# Patient Record
Sex: Male | Born: 1960 | Race: White | Hispanic: No | Smoking: Current every day smoker
Health system: Southern US, Community
[De-identification: ages and names within clinical notes are randomized; demographics above are authoritative.]

## PROBLEM LIST (undated history)

## (undated) DIAGNOSIS — J449 Chronic obstructive pulmonary disease, unspecified: Secondary | ICD-10-CM

## (undated) DIAGNOSIS — F419 Anxiety disorder, unspecified: Secondary | ICD-10-CM

## (undated) DIAGNOSIS — R7989 Other specified abnormal findings of blood chemistry: Secondary | ICD-10-CM

## (undated) DIAGNOSIS — T7840XA Allergy, unspecified, initial encounter: Secondary | ICD-10-CM

## (undated) DIAGNOSIS — K219 Gastro-esophageal reflux disease without esophagitis: Secondary | ICD-10-CM

## (undated) DIAGNOSIS — J45909 Unspecified asthma, uncomplicated: Secondary | ICD-10-CM

## (undated) DIAGNOSIS — R002 Palpitations: Secondary | ICD-10-CM

## (undated) DIAGNOSIS — R011 Cardiac murmur, unspecified: Secondary | ICD-10-CM

## (undated) DIAGNOSIS — H269 Unspecified cataract: Secondary | ICD-10-CM

## (undated) HISTORY — PX: TONSILLECTOMY: SUR1361

## (undated) HISTORY — DX: Allergy, unspecified, initial encounter: T78.40XA

## (undated) HISTORY — DX: Gastro-esophageal reflux disease without esophagitis: K21.9

## (undated) HISTORY — DX: Unspecified cataract: H26.9

## (undated) HISTORY — DX: Other specified abnormal findings of blood chemistry: R79.89

## (undated) HISTORY — DX: Unspecified asthma, uncomplicated: J45.909

## (undated) HISTORY — DX: Cardiac murmur, unspecified: R01.1

## (undated) HISTORY — DX: Anxiety disorder, unspecified: F41.9

## (undated) HISTORY — DX: Palpitations: R00.2

## (undated) HISTORY — PX: OTHER SURGICAL HISTORY: SHX169

## (undated) SURGERY — Surgical Case
Anesthesia: *Unknown

---
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
## 1992-10-10 HISTORY — PX: COLONOSCOPY: SHX174

## 1998-10-10 DIAGNOSIS — H269 Unspecified cataract: Secondary | ICD-10-CM

## 1998-10-10 HISTORY — DX: Unspecified cataract: H26.9

## 2006-11-24 ENCOUNTER — Emergency Department (HOSPITAL_COMMUNITY): Admission: EM | Admit: 2006-11-24 | Discharge: 2006-11-24 | Payer: Self-pay | Admitting: Emergency Medicine

## 2010-02-19 ENCOUNTER — Encounter: Admission: RE | Admit: 2010-02-19 | Discharge: 2010-02-19 | Payer: Self-pay | Admitting: Orthopedic Surgery

## 2018-10-10 DIAGNOSIS — J45909 Unspecified asthma, uncomplicated: Secondary | ICD-10-CM

## 2018-10-10 DIAGNOSIS — K219 Gastro-esophageal reflux disease without esophagitis: Secondary | ICD-10-CM

## 2018-10-10 HISTORY — DX: Unspecified asthma, uncomplicated: J45.909

## 2018-10-10 HISTORY — DX: Gastro-esophageal reflux disease without esophagitis: K21.9

## 2019-10-11 DIAGNOSIS — F419 Anxiety disorder, unspecified: Secondary | ICD-10-CM

## 2019-10-11 HISTORY — DX: Anxiety disorder, unspecified: F41.9

## 2019-11-08 ENCOUNTER — Encounter: Payer: Self-pay | Admitting: Emergency Medicine

## 2019-11-08 ENCOUNTER — Other Ambulatory Visit: Payer: Self-pay

## 2019-11-08 ENCOUNTER — Ambulatory Visit
Admission: EM | Admit: 2019-11-08 | Discharge: 2019-11-08 | Disposition: A | Discharge status: Home / Self Care | Payer: Managed Care, Other (non HMO)

## 2019-11-08 DIAGNOSIS — I498 Other specified cardiac arrhythmias: Secondary | ICD-10-CM | POA: Diagnosis not present

## 2019-11-08 HISTORY — DX: Chronic obstructive pulmonary disease, unspecified: J44.9

## 2019-11-08 NOTE — ED Provider Notes (Signed)
[facility]    CSN: [id number] Arrival date & time: 11/08/19  0854      History   Chief Complaint Chief Complaint  Patient presents with  . Dizziness    HPI Robert Yates is a 60 y.o. male.   Robert Yates 59 years old male with history of heart murmur presented to the urgent care with a complaint of heart fluttering on and off for the past 25-month.  Denies a precipitating event, trauma, or recent URI within the past month.  Report chest pain and dizziness at time.  Describes the dizziness as unsteady to walk. Has tried any medication.  Nothing made his symptoms worse.  Admits to previous symptoms which self resolved.  Denies fever, chills, nausea, vomiting, hearing changes, tinnitus, ear pain, chest pain, SOB, weakness, slurred speech, memory or emotional changes, facial drooping/ asymmetry, incoordination, numbness or tingling, abdominal pain, changes in bowel or bladder habits.    The history is provided by the patient. No language interpreter was used.  Dizziness Associated symptoms: chest pain     Past Medical History:  Diagnosis Date  . COPD (chronic obstructive pulmonary disease) (HCC)     There are no problems to display for this patient.   History reviewed. No pertinent surgical history.     Home Medications    Prior to Admission medications   Medication Sig Start Date End Date Taking? Authorizing Provider  Adair Patter 200-25 MCG/INH AEPB  10/02/19   [provider]    Family History Family History  Adopted: Yes    Social History Social History   Tobacco Use  . Smoking status: Current Some Day Smoker    Packs/day: 1.00    Types: Cigarettes  . Smokeless tobacco: Never Used  Substance Use Topics  . Alcohol use: Yes  . Drug use: Never     Allergies   Sulfa antibiotics   Review of Systems Review of Systems  Constitutional: Negative.   HENT: Negative.   Respiratory: Negative.   Cardiovascular: Positive for chest pain.   Neurological: Positive for dizziness.  All other systems reviewed and are negative.    Physical Exam Triage Vital Signs ED Triage Vitals  Enc Vitals Group     BP      Pulse      Resp      Temp      Temp src      SpO2      Weight      Height      Head Circumference      Peak Flow      Pain Score      Pain Loc      Pain Edu?      Excl. in [place]?    No data found.  Updated Vital Signs BP 127/83   Pulse 89   Temp 98.1 F (36.7 C)   Resp 18   SpO2 96%   Visual Acuity Right Eye Distance:   Left Eye Distance:   Bilateral Distance:    Right Eye Near:   Left Eye Near:    Bilateral Near:     Physical Exam Vitals and nursing note reviewed.  Constitutional:      General: He is not in acute distress.    Appearance: Normal appearance. He is normal weight. He is not toxic-appearing.  Cardiovascular:     Rate and Rhythm: Normal rate and regular rhythm.     Pulses: Normal pulses.     Heart sounds:  Murmur present.  Pulmonary:     Effort: Pulmonary effort is normal. No respiratory distress.     Breath sounds: Normal breath sounds. No wheezing, rhonchi or rales.  Chest:     Chest wall: No tenderness.  Neurological:     General: No focal deficit present.     Mental Status: He is alert and oriented to person, place, and time. Mental status is at baseline.     Cranial Nerves: Cranial nerves are intact. No cranial nerve deficit.     Sensory: Sensation is intact. No sensory deficit.     Motor: No weakness.     Coordination: Coordination is intact. Coordination normal.     Gait: Gait is intact. Gait normal.     Deep Tendon Reflexes: Reflexes normal.      UC Treatments / Results  Labs (all labs ordered are listed, but only abnormal results are displayed) Labs Reviewed - No data to display  EKG   Radiology No results found.  Procedures Procedures (including critical care time)  Medications Ordered in UC Medications - No data to display  Initial Impression /  Assessment and Plan / UC Course  I have reviewed the triage vital signs and the nursing notes.  Pertinent labs & imaging results that were available during my care of the patient were reviewed by me and considered in my medical decision making (see chart for details).   Currently patient is asymptomatic with no chest pain and dizziness and is stable for discharge . EKG was completed and showed normal sinus rhythm Follow-up with cardiologist as needed Primary care resources was given Advised patient to go to ER if he is having chest pain  Final Clinical Impressions(s) / UC Diagnoses   Final diagnoses:  Periodic heart flutter     Discharge Instructions     EKG showed normal sinus rhythm Primary care resource was given Referral to cardiologist is needed Advised patient to go to ER if he is having chest pain    ED Prescriptions    None     PDMP not reviewed this encounter.   Emerson Monte, [place] 11/08/19 [phone]

## 2019-11-08 NOTE — ED Triage Notes (Signed)
Pt presents with c/o heart fluttering for years.also chest tightness  Pt states that he he had a dizzy spell yesterday. Pt doesn't have PCP but decided he wanted to get checked out

## 2019-11-08 NOTE — Discharge Instructions (Addendum)
EKG showed normal sinus rhythm Primary care resource was given Referral to cardiologist is needed Advised patient to go to ER if he is having chest pain

## 2019-12-16 ENCOUNTER — Other Ambulatory Visit: Payer: Self-pay

## 2019-12-16 ENCOUNTER — Encounter (INDEPENDENT_AMBULATORY_CARE_PROVIDER_SITE_OTHER): Payer: Self-pay | Admitting: Internal Medicine

## 2019-12-16 ENCOUNTER — Ambulatory Visit (INDEPENDENT_AMBULATORY_CARE_PROVIDER_SITE_OTHER): Payer: 59 | Admitting: Internal Medicine

## 2019-12-16 VITALS — BP 122/75 | HR 90 | Temp 97.5°F | Ht 74.0 in | Wt 191.2 lb

## 2019-12-16 DIAGNOSIS — Z1211 Encounter for screening for malignant neoplasm of colon: Secondary | ICD-10-CM | POA: Diagnosis not present

## 2019-12-16 DIAGNOSIS — Z125 Encounter for screening for malignant neoplasm of prostate: Secondary | ICD-10-CM

## 2019-12-16 DIAGNOSIS — Z13220 Encounter for screening for lipoid disorders: Secondary | ICD-10-CM

## 2019-12-16 DIAGNOSIS — Z131 Encounter for screening for diabetes mellitus: Secondary | ICD-10-CM

## 2019-12-16 DIAGNOSIS — F17210 Nicotine dependence, cigarettes, uncomplicated: Secondary | ICD-10-CM

## 2019-12-16 DIAGNOSIS — Z23 Encounter for immunization: Secondary | ICD-10-CM

## 2019-12-16 DIAGNOSIS — E559 Vitamin D deficiency, unspecified: Secondary | ICD-10-CM

## 2019-12-16 DIAGNOSIS — J449 Chronic obstructive pulmonary disease, unspecified: Secondary | ICD-10-CM

## 2019-12-16 DIAGNOSIS — R5381 Other malaise: Secondary | ICD-10-CM

## 2019-12-16 DIAGNOSIS — R5383 Other fatigue: Secondary | ICD-10-CM

## 2019-12-16 HISTORY — DX: Chronic obstructive pulmonary disease, unspecified: J44.9

## 2019-12-16 NOTE — Progress Notes (Signed)
Metrics: Intervention Frequency ACO  Documented Smoking Status Yearly  Screened one or more times in 24 months  Cessation Counseling or  Active cessation medication Past 24 months  Past 24 months   Guideline developer: UpToDate (See UpToDate for funding source) Date Released: 2014       Wellness Office Visit  Subjective:  Patient ID: Robert Yates, male    DOB: [birth year]  Age: 59 y.o. MRN: [id number]  CC: This 59 year old man comes in as a new patient to be established. HPI He has underlying COPD which seems to be well controlled on medications and he does not require oxygen.  He continues to smoke cigarettes about a pack a day.  He has tried to quit in the past but has been unsuccessful. He has not seen a physician for many years now. He was seen at the urgent care in January of this year for palpitations it appears.  ECG shows sinus rhythm he does not appear to have the symptoms at the present time. He does describe intermittent fatigue.  Past Medical History:  Diagnosis Date  . COPD (chronic obstructive pulmonary disease) ([place])       Family History  Adopted: Yes  Problem Relation Age of Onset  . 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   Social History   Tobacco Use  . Smoking status: Current Some Day Smoker    Packs/day: 1.00    Types: Cigarettes  . Smokeless tobacco: Never Used  Substance Use Topics  . Alcohol use: Yes    Comment: occasional    Current Meds  Medication Sig  . BREO ELLIPTA 200-25 MCG/INH AEPB   . ibuprofen (ADVIL) 400 MG tablet Take 400 mg by mouth every evening.      Objective:   Today's Vitals: BP 122/75 (BP Location: Right Arm, Patient Position: Sitting, Cuff Size: Normal)   Pulse 90   Temp (!) 97.5 F (36.4 C) (Temporal)   Ht 6\' 2"  (1.88 m)   Wt 191 lb 3.2 oz (86.7 kg)   SpO2 96%    BMI 24.55 kg/m  Vitals with BMI 12/16/2019 11/08/2019  Height 6\' 2"  -  Weight 191 lbs 3 oz -  BMI 123XX123 -  Systolic 123XX123 [id number]  Diastolic 75 83  Pulse 90 89     Physical Exam   He looks systemically well.  He is in no acute respiratory distress.    Assessment   1. Need for tetanus booster   2. Chronic obstructive pulmonary disease, unspecified COPD type ([place])   3. Colon cancer screening   4. Malaise and fatigue   5. Screening for diabetes mellitus   6. Special screening for malignant neoplasm of prostate   7. Screening for lipoid disorders   8. Vitamin D deficiency disease   9. Cigarette nicotine dependence without complication       Tests ordered Orders Placed This Encounter  Procedures  . Tdap vaccine greater than or equal to 7yo IM  . CBC  . COMPLETE METABOLIC PANEL WITH GFR  . Hemoglobin A1c  . Lipid panel  . T3, free  . TSH  . VITAMIN D 25 Hydroxy (Vit-D Deficiency, Fractures)  . T4  . PSA  . Ambulatory referral to Gastroenterology     Plan: 1. Blood work is ordered above and we will send him for screening colonoscopy.  2. I spent at 10 minutes discussing smoking cessation. 3. He was given Tdap vaccination today. 4. Further recommendations will depend on blood results and I will see him in 1 month's time for an annual physical exam. 5. I spent 40 minutes with this patient today discussing his overall health.   No orders of the defined types were placed in this encounter.   Doree Albee, MD

## 2019-12-17 ENCOUNTER — Encounter: Payer: Self-pay | Admitting: Internal Medicine

## 2019-12-17 LAB — COMPLETE METABOLIC PANEL WITH GFR
AG Ratio: 1.9 (calc) (ref 1.0–2.5)
ALT: 17 U/L (ref 9–46)
AST: 16 U/L (ref 10–35)
Albumin: 4.7 g/dL (ref 3.6–5.1)
Alkaline phosphatase (APISO): 65 U/L (ref 35–144)
BUN: 14 mg/dL (ref 7–25)
CO2: 30 mmol/L (ref 20–32)
Calcium: 10.5 mg/dL — ABNORMAL HIGH (ref 8.6–10.3)
Chloride: 104 mmol/L (ref 98–110)
Creat: 0.82 mg/dL (ref 0.70–1.25)
GFR, Est African American: 111 mL/min/{1.73_m2} (ref >=60)
GFR, Est Non African American: 95 mL/min/{1.73_m2} (ref >=60)
Globulin: 2.5 g/dL (calc) (ref 1.9–3.7)
Glucose, Bld: 84 mg/dL (ref 65–139)
Potassium: 4.6 mmol/L (ref 3.5–5.3)
Sodium: 140 mmol/L (ref 135–146)
Total Bilirubin: 0.4 mg/dL (ref 0.2–1.2)
Total Protein: 7.2 g/dL (ref 6.1–8.1)

## 2019-12-17 LAB — LIPID PANEL
Cholesterol: 211 mg/dL — ABNORMAL HIGH (ref <200)
HDL: 46 mg/dL (ref >=40)
LDL Cholesterol (Calc): 135 mg/dL (calc) — ABNORMAL HIGH
Non-HDL Cholesterol (Calc): 165 mg/dL (calc) — ABNORMAL HIGH (ref <130)
Total CHOL/HDL Ratio: 4.6 (calc) (ref <5.0)
Triglycerides: 163 mg/dL — ABNORMAL HIGH (ref <150)

## 2019-12-17 LAB — CBC
HCT: 51.3 % — ABNORMAL HIGH (ref 38.5–50.0)
Hemoglobin: 17.2 g/dL — ABNORMAL HIGH (ref 13.2–17.1)
MCH: 29.6 pg (ref 27.0–33.0)
MCHC: 33.5 g/dL (ref 32.0–36.0)
MCV: 88.1 fL (ref 80.0–100.0)
MPV: 10.6 fL (ref 7.5–12.5)
Platelets: 283 10*3/uL (ref 140–400)
RBC: 5.82 10*6/uL — ABNORMAL HIGH (ref 4.20–5.80)
RDW: 13.1 % (ref 11.0–15.0)
WBC: 6.1 10*3/uL (ref 3.8–10.8)

## 2019-12-17 LAB — T3, FREE: T3, Free: 3.3 pg/mL (ref 2.3–4.2)

## 2019-12-17 LAB — HEMOGLOBIN A1C
Hgb A1c MFr Bld: 5.7 % of total Hgb — ABNORMAL HIGH (ref <5.7)
Mean Plasma Glucose: 117 (calc)
eAG (mmol/L): 6.5 (calc)

## 2019-12-17 LAB — PSA: PSA: 2.6 ng/mL (ref <=4.0)

## 2019-12-17 LAB — VITAMIN D 25 HYDROXY (VIT D DEFICIENCY, FRACTURES): Vit D, 25-Hydroxy: 20 ng/mL — ABNORMAL LOW (ref 30–100)

## 2019-12-17 LAB — TSH: TSH: 1.71 mIU/L (ref 0.40–4.50)

## 2019-12-17 LAB — T4: T4, Total: 10.9 ug/dL — ABNORMAL HIGH (ref 4.9–10.5)

## 2020-01-14 ENCOUNTER — Other Ambulatory Visit: Payer: Self-pay

## 2020-01-14 ENCOUNTER — Encounter (INDEPENDENT_AMBULATORY_CARE_PROVIDER_SITE_OTHER): Payer: Self-pay | Admitting: Internal Medicine

## 2020-01-14 ENCOUNTER — Ambulatory Visit (INDEPENDENT_AMBULATORY_CARE_PROVIDER_SITE_OTHER): Payer: 59 | Admitting: Internal Medicine

## 2020-01-14 VITALS — BP 120/70 | HR 92 | Temp 98.6°F | Ht 74.0 in | Wt 174.4 lb

## 2020-01-14 DIAGNOSIS — R7303 Prediabetes: Secondary | ICD-10-CM

## 2020-01-14 DIAGNOSIS — R002 Palpitations: Secondary | ICD-10-CM | POA: Diagnosis not present

## 2020-01-14 DIAGNOSIS — R0789 Other chest pain: Secondary | ICD-10-CM

## 2020-01-14 DIAGNOSIS — E559 Vitamin D deficiency, unspecified: Secondary | ICD-10-CM | POA: Diagnosis not present

## 2020-01-14 DIAGNOSIS — G4700 Insomnia, unspecified: Secondary | ICD-10-CM

## 2020-01-14 DIAGNOSIS — E782 Mixed hyperlipidemia: Secondary | ICD-10-CM

## 2020-01-14 DIAGNOSIS — R6882 Decreased libido: Secondary | ICD-10-CM

## 2020-01-14 NOTE — Patient Instructions (Addendum)
[facility] Dietary Recommendations for Weight Loss What to Avoid . Avoid added sugars o Often added sugar can be found in processed foods such as many condiments, dry cereals, cakes, cookies, chips, crisps, crackers, candies, sweetened drinks, etc.  o Read labels and AVOID/DECREASE use of foods with the following in their ingredient list: Sugar, fructose, high fructose corn syrup, sucrose, glucose, maltose, dextrose, molasses, cane sugar, brown sugar, any type of syrup, agave nectar, etc.   . Avoid snacking in between meals . Avoid foods made with flour o If you are going to eat food made with flour, choose those made with whole-grains; and, minimize your consumption as much as is tolerable . Avoid processed foods o These foods are generally stocked in the middle of the grocery store. Focus on shopping on the perimeter of the grocery.  . Avoid Meat  o We recommend following a plant-based diet at [facility]. Thus, we recommend avoiding meat as a general rule. Consider eating beans, legumes, eggs, and/or dairy products for regular protein sources o If you plan on eating meat limit to 4 ounces of meat at a time and choose lean options such as Fish, chicken, turkey. Avoid red meat intake such as pork and/or steak What to Include . Vegetables o GREEN LEAFY VEGETABLES: Kale, spinach, mustard greens, collard greens, cabbage, broccoli, etc. o OTHER: Asparagus, cauliflower, eggplant, carrots, peas, Brussel sprouts, tomatoes, bell peppers, zucchini, beets, cucumbers, etc. . Grains, seeds, and legumes o Beans: kidney beans, black eyed peas, garbanzo beans, black beans, pinto beans, etc. o Whole, unrefined grains: brown rice, barley, bulgur, oatmeal, etc. . Healthy fats  o Avoid highly processed fats such as vegetable oil o Examples of healthy fats: avocado, olives, virgin olive oil, dark chocolate (?72% Cocoa), nuts (peanuts, almonds, walnuts, cashews, pecans, etc.) . None to Low  Intake of Animal Sources of Protein o Meat sources: chicken, turkey, salmon, tuna. Limit to 4 ounces of meat at one time. o Consider limiting dairy sources, but when choosing dairy focus on: PLAIN Greek yogurt, cottage cheese, high-protein milk . Fruit o Choose berries  When to Eat . Intermittent Fasting: o Choosing not to eat for a specific time period, but DO FOCUS ON HYDRATION when fasting o Multiple Techniques: - Time Restricted Eating: eat 3 meals in a day, each meal lasting no more than 60 minutes, no snacks between meals - 16-18 hour fast: fast for 16 to 18 hours up to 7 days a week. Often suggested to start with 2-3 nonconsecutive days per week.  . Remember the time you sleep is counted as fasting.  . Examples of eating schedule: Fast from 7:00pm-11:00am. Eat between 11:00am-7:00pm.  - 24-hour fast: fast for 24 hours up to every other day. Often suggested to start with 1 day per week . Remember the time you sleep is counted as fasting . Examples of eating schedule:  o Eating day: eat 2-3 meals on your eating day. If doing 2 meals, each meal should last no more than 90 minutes. If doing 3 meals, each meal should last no more than 60 minutes. Finish last meal by 7:00pm. o Fasting day: Fast until 7:00pm.  o IF YOU FEEL UNWELL FOR ANY REASON/IN ANY WAY WHEN FASTING, STOP FASTING BY EATING A NUTRITIOUS SNACK OR LIGHT MEAL o ALWAYS FOCUS ON HYDRATION DURING FASTS - Acceptable Hydration sources: water, broths, tea/coffee (black tea/coffee is best but using a small amount of whole-fat dairy products in coffee/tea is acceptable).  -   Poor Hydration Sources: anything with sugar or artificial sweeteners added to it  These recommendations have been developed for patients that are actively receiving medical care from either Dr. Anastasio Champion or Jeralyn Ruths, DNP, NP-C at [facility]. These recommendations are developed for patients with specific medical conditions and are not meant to be  distributed or used by others that are not actively receiving care from either provider listed above at [facility]. It is not appropriate to participate in the above eating plans without proper medical supervision.   Reference: Rexanne Mano. The obesity code. [place]; 2016.  VITAMIN D3 10,000 UNITS/DAY   [email or web address]  Melatonin 1mg  capsules(#60)

## 2020-01-14 NOTE — Progress Notes (Signed)
PALMetrics: Intervention Frequency ACO  Documented Smoking Status Yearly  Screened one or more times in 24 months  Cessation Counseling or  Active cessation medication Past 24 months  Past 24 months   Guideline developer: UpToDate (See UpToDate for funding source) Date Released: 2014       Wellness Office Visit  Subjective:  Patient ID: Robert Yates, male    DOB: 09-14-1961  Age: 59 y.o. MRN: [id number]  CC: This man comes in for follow-up regarding his blood work from the last visit. HPI  He still complaining of left-sided chest pain associated with palpitations.  The pain is sharp in nature and he had gone to the urgent care for his palpitations.  Unfortunately, this still seem to be occurring. I discussed all his results with him today.  He is prediabetic and has vitamin D deficiency. He also has hyperlipidemia which does not require statin therapy. Unfortunately, he continues to smoke cigarettes. He also describes decreased libido which is been a problem for the last couple of years.  He is worried about his testosterone levels. Past Medical History:  Diagnosis Date  . COPD (chronic obstructive pulmonary disease) ([place])       Family History  Adopted: Yes  Problem Relation Age of Onset  . 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   Social History   Tobacco Use  . Smoking status: Current Some Day Smoker    Packs/day: 1.00    Types: Cigarettes  . Smokeless tobacco: Never Used  Substance Use Topics  . Alcohol use: Yes    Comment: occasional    Current Meds  Medication Sig  . BREO ELLIPTA 200-25 MCG/INH AEPB   . ibuprofen (ADVIL) 400 MG tablet Take 400 mg by mouth every evening.       Objective:   Today's Vitals: BP 120/70 (BP Location: Right Arm, Patient Position: Sitting, Cuff Size: Normal)   Pulse 92   Temp  98.6 F (37 C) (Oral)   Ht 6\' 2"  (1.88 m)   Wt 174 lb 6.4 oz (79.1 kg)   SpO2 95%   BMI 22.39 kg/m  Vitals with BMI 01/14/2020 12/16/2019 11/08/2019  Height 6\' 2"  6\' 2"  -  Weight 174 lbs 6 oz 191 lbs 3 oz -  BMI [id number] -  Systolic 123456 [id number] [id number]  Diastolic 70 75 83  Pulse 92 90 89     Physical Exam  He looks systemically well.  Blood pressure is well controlled.  No other new physical findings.  The weight of 191 pounds on the last visit is an error.     Assessment   1. Vitamin D deficiency disease   2. Mixed hyperlipidemia   3. Prediabetes   4. Palpitations   5. Other chest pain   6. Decreased libido   7. Insomnia, unspecified type       Tests ordered Orders Placed This Encounter  Procedures  . Testosterone Total,Free,Bio, Males  . Ambulatory referral to Cardiology     Plan: 1. I will refer him to cardiology as soon as possible for his symptoms of palpitations. 2. His risk for coronary artery disease in the next 10 years is 8.6% not taking into account coronary artery calcium score. 3. We discussed smoking cessation. 4. We discussed nutrition.  He realizes he needs to cut out sugary foods that he  tends to eat. 5. I recommended he start taking vitamin D3 10,000 units daily. 6. I will see him soon in the next 2 to 3 weeks time to discuss testosterone levels to see if we can help him with this. 7. Today I spent 30 minutes with this patient discussing all his results and answering his questions.   No orders of the defined types were placed in this encounter.   Doree Albee, MD

## 2020-01-15 LAB — TESTOSTERONE TOTAL,FREE,BIO, MALES
Albumin: 4.4 g/dL (ref 3.6–5.1)
Sex Hormone Binding: 59 nmol/L (ref 22–77)
Testosterone, Bioavailable: 83.4 ng/dL — ABNORMAL LOW (ref >=110.0)
Testosterone, Free: 41.4 pg/mL — ABNORMAL LOW (ref 46.0–224.0)
Testosterone: 511 ng/dL (ref 250–827)

## 2020-02-11 ENCOUNTER — Ambulatory Visit: Payer: 59 | Admitting: Cardiovascular Disease

## 2020-02-12 ENCOUNTER — Ambulatory Visit: Payer: 59

## 2020-02-19 ENCOUNTER — Ambulatory Visit (INDEPENDENT_AMBULATORY_CARE_PROVIDER_SITE_OTHER): Payer: 59 | Admitting: Internal Medicine

## 2020-04-17 ENCOUNTER — Ambulatory Visit: Payer: 59 | Admitting: Cardiovascular Disease

## 2020-05-05 ENCOUNTER — Other Ambulatory Visit: Payer: Self-pay

## 2020-05-05 ENCOUNTER — Ambulatory Visit (INDEPENDENT_AMBULATORY_CARE_PROVIDER_SITE_OTHER): Payer: Self-pay | Admitting: *Deleted

## 2020-05-05 DIAGNOSIS — Z1211 Encounter for screening for malignant neoplasm of colon: Secondary | ICD-10-CM

## 2020-05-05 NOTE — Progress Notes (Signed)
Pt requested a procedure after Labor Day and on a Friday.  Informed pt that I would call him back once Sept procedure schedules have been released.

## 2020-05-05 NOTE — Progress Notes (Signed)
Gastroenterology Pre-Procedure Review  Request Date: 05/05/2020 Requesting Physician: Dr. Carin Hock, Last TCS 09/10/1993 done by Dr. Fuller Plan, fragments of colonic mucosa with focal changes of hyperplastic colon polyps (x3)  PATIENT REVIEW QUESTIONS: The patient responded to the following health history questions as indicated:    1. Diabetes Melitis: no 2. Joint replacements in the past 12 months: no 3. Major health problems in the past 3 months: no 4. Has an artificial valve or MVP: no 5. Has a defibrillator: no 6. Has been advised in past to take antibiotics in advance of a procedure like teeth cleaning: no 7. Family history of colon cancer: yes, brother: age 4 8. Alcohol Use: yes, 6 beers a year 9. Illicit drug Use: no 10. History of sleep apnea: no 11. History of coronary artery or other vascular stents placed within the last 12 months: no 12. History of any prior anesthesia complications: no 13. There is no height or weight on file to calculate BMI. ht: 6'2 wt: 175 lbs    MEDICATIONS & ALLERGIES:    Patient reports the following regarding taking any blood thinners:   Plavix? no Aspirin? no Coumadin? no Brilinta? no Xarelto? no Eliquis? no Pradaxa? no Savaysa? no Effient? no  Patient confirms/reports the following medications:  Current Outpatient Medications  Medication Sig Dispense Refill  . BREO ELLIPTA 200-25 MCG/INH AEPB daily.     Marland Kitchen ibuprofen (ADVIL) 400 MG tablet Take 400 mg by mouth as needed.     . Ibuprofen-diphenhydrAMINE HCl (ADVIL PM) 200-25 MG CAPS Take by mouth at bedtime.    . Melatonin 10 MG CAPS Take by mouth as needed.     No current facility-administered medications for this visit.    Patient confirms/reports the following allergies:  Allergies  Allergen Reactions  . Sulfa Antibiotics Rash    No orders of the defined types were placed in this encounter.   AUTHORIZATION INFORMATION Primary Insurance: [place],  [place] #: [id number],  Group #:  [id number] Pre-Cert / Josem Kaufmann required: No, not required  SCHEDULE INFORMATION: Procedure has been scheduled as follows:  Date: , Time: Location: [facility] with Dr. Abbey Chatters  This Gastroenterology Pre-Precedure Review Form is being routed to the following provider(s): Aliene Altes, PA

## 2020-05-06 NOTE — Progress Notes (Signed)
Looks like patient has a pending cardiology visit in September. This is a new referral from PCP for further evaluation of chest pain and heart palpitations. I recommend we hold off on colonoscopy until cardiac evaluation is complete.

## 2020-05-07 NOTE — Progress Notes (Signed)
Called pt and informed him that we need to hold off on colonoscopy until cardiac evaluation is complete.  Informed pt to let his cardiologist know that he is supposed to have a future colonoscopy.  Advised him to call us back when and if his cardiologist thinks it is safe to have done.  Pt voiced understanding.

## 2020-06-04 ENCOUNTER — Encounter: Payer: Self-pay | Admitting: Cardiology

## 2020-06-04 DIAGNOSIS — Z7189 Other specified counseling: Secondary | ICD-10-CM | POA: Insufficient documentation

## 2020-06-04 DIAGNOSIS — R002 Palpitations: Secondary | ICD-10-CM | POA: Insufficient documentation

## 2020-06-04 NOTE — Progress Notes (Addendum)
Cardiology Office Note   Date:  06/14/2020   ID:  Robert Yates, DOB 21-Dec-1960, MRN [id number]  PCP:  Doree Albee, MD  Cardiologist:   Minus Breeding, MD Referring:  Doree Albee, MD  Chief Complaint  Patient presents with  . Palpitations      History of Present Illness: Robert Yates is a 59 y.o. male who was referred by Doree Albee, MD for evaluation of palpitations.  Patient has no past cardiac history.  He said he has been having palpitations.  He has noticed this when he is getting anxious and he thinks he is getting more anxious with age.  He feels his heart racing or skipping when he is anxious.  He will get short of breath.  It might last seconds to minutes.  He cannot bring it on.  He is not have any presyncope or syncope.  He has some rare fleeting chest discomfort.  He feels some numbness in his hand on the left side and some numbness on his left foot occasionally.  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  However, he is not having any visual disturbance.  He is sleeping poorly.  He does not describe any exertional chest pressure, neck or arm discomfort.  He does not describe any new shortness of breath, PND or orthopnea.  He has had no prior significant cardiac work-up other than when he was a child up until the age of 45 he reports having had evaluation including angiograms for an apparent congenital heart problem but he did not need to see an adult cardiologist by his report.  Past Medical History:  Diagnosis Date  . COPD (chronic obstructive pulmonary disease) ([place])   . Palpitations     History reviewed. No pertinent surgical history.   Current Outpatient Medications  Medication Sig Dispense Refill  . BREO ELLIPTA 200-25 MCG/INH AEPB daily.     Marland Kitchen ibuprofen (ADVIL) 400 MG tablet Take 400 mg by mouth as needed.     . Ibuprofen-diphenhydrAMINE HCl (ADVIL PM) 200-25 MG CAPS Take by mouth at bedtime.    . Melatonin 10 MG  CAPS Take by mouth as needed.    Marland Kitchen VITAMIN D PO Take by mouth 3 (three) times a week.    . propranolol (INDERAL) 10 MG tablet Take 1 tablet (10 mg total) by mouth 3 (three) times daily as needed. 90 tablet 3   No current facility-administered medications for this visit.    Allergies:   Sulfa antibiotics    Social History:  The patient  reports that he has been smoking cigarettes. He started smoking about 45 years ago. He has been smoking about 1.00 pack per day. He has never used smokeless tobacco. He reports current alcohol use. He reports current drug use.   Family History:  The patient's family history includes Cancer in his brother. He was adopted.    ROS:  Please see the history of present illness.   Otherwise, review of systems are positive for none.   All other systems are reviewed and negative.    PHYSICAL EXAM: VS:  BP 120/80   Pulse 80   Ht 6\' 2"  (1.88 m)   Wt 173 lb 12.8 oz (78.8 kg)   SpO2 97%   BMI 22.31 kg/m  , BMI Body mass index is 22.31 kg/m. GENERAL:  Well appearing HEENT:  Pupils equal round and reactive, fundi not visualized, oral mucosa unremarkable NECK:  No jugular venous distention, waveform within normal limits, carotid upstroke brisk and symmetric, no bruits, no thyromegaly LYMPHATICS:  No cervical, inguinal adenopathy LUNGS:  Clear to auscultation bilaterally BACK:  No CVA tenderness CHEST:  Unremarkable HEART:  PMI not displaced or sustained,S1 and S2 within normal limits, no S3, no S4, no clicks, no rubs, no murmurs ABD:  Flat, positive bowel sounds normal in frequency in pitch, no bruits, no rebound, no guarding, no midline pulsatile mass, no hepatomegaly, no splenomegaly EXT:  2 plus pulses throughout, no edema, no cyanosis no clubbing SKIN:  No rashes no nodules NEURO:  Cranial nerves II through XII grossly intact, motor grossly intact throughout PSYCH:  Cognitively intact, oriented to person place and time    EKG:  EKG is ordered today. The  ekg ordered today demonstrates sinus rhythm, rate 84, axis within normal limits, intervals within normal limits, premature ventricular contractions.   Recent Labs: 12/16/2019: ALT 17; BUN 14; Creat 0.82; Hemoglobin 17.2; Platelets 283; Potassium 4.6; Sodium 140; TSH 1.71    Lipid Panel    Component Value Date/Time   CHOL 211 (H) 12/16/2019 0919   TRIG 163 (H) 12/16/2019 0919   HDL 46 12/16/2019 0919   CHOLHDL 4.6 12/16/2019 0919   LDLCALC 135 (H) 12/16/2019 0919      Wt Readings from Last 3 Encounters:  06/05/20 173 lb 12.8 oz (78.8 kg)  01/14/20 174 lb 6.4 oz (79.1 kg)  12/16/19 191 lb 3.2 oz (86.7 kg)      Other studies Reviewed: Additional studies/ records that were reviewed today include: None. Review of the above records demonstrates:     ASSESSMENT AND PLAN:  PALPITATIONS: He does have PVCs on his EKG.  I suspect that is what he is feeling.  I am going to check an echocardiogram to make sure he has a structurally normal heart.  I would like to give him propranolol 10 mg as needed to take.  I will see him back in a few months to further evaluate at which point he might need to do other testing like a monitor.  CONGENITAL HEART DISEASE: This will be evaluated as above with the echo.  INSOMNIA: He has decreased sleep and snoring.  This is causing some problems during the day with work.  I will refer him for a sleep specialist evaluation.  PREOP: The patient is due to have a colonoscopy and is at acceptable risk for this.  No further testing is needed prior to this.  COVID EDUCATION: He has been vaccinated.   Current medicines are reviewed at length with the patient today.  The patient does not have concerns regarding medicines.  The following changes have been made: As above  Labs/ tests ordered today include:   Orders Placed This Encounter  Procedures  . Ambulatory referral to Neurology  . EKG 12-Lead  . ECHOCARDIOGRAM COMPLETE     Disposition:   FU with me  in about 3 months.      Signed, Minus Breeding, MD  06/14/2020 2:34 PM    [place]

## 2020-06-05 ENCOUNTER — Other Ambulatory Visit: Payer: Self-pay

## 2020-06-05 ENCOUNTER — Ambulatory Visit: Payer: 59 | Admitting: Cardiology

## 2020-06-05 ENCOUNTER — Encounter: Payer: Self-pay | Admitting: Cardiology

## 2020-06-05 VITALS — BP 120/80 | HR 80 | Ht 74.0 in | Wt 173.8 lb

## 2020-06-05 DIAGNOSIS — Z7189 Other specified counseling: Secondary | ICD-10-CM

## 2020-06-05 DIAGNOSIS — G4730 Sleep apnea, unspecified: Secondary | ICD-10-CM | POA: Diagnosis not present

## 2020-06-05 DIAGNOSIS — R002 Palpitations: Secondary | ICD-10-CM | POA: Diagnosis not present

## 2020-06-05 MED ORDER — PROPRANOLOL HCL 10 MG PO TABS: 10.0000 mg | ORAL_TABLET | Freq: Three times a day (TID) | ORAL | 3 refills | Status: DC | PRN

## 2020-06-05 MED ORDER — PROPRANOLOL HCL 10 MG PO TABS: 10.0000 mg | ORAL_TABLET | Freq: Three times a day (TID) | ORAL | 3 refills | Status: DC

## 2020-06-05 NOTE — Addendum Note (Signed)
Addended by: Julian Hy T on: 06/05/2020 01:33 PM   Modules accepted: Orders

## 2020-06-05 NOTE — Patient Instructions (Addendum)
Your physician recommends that you schedule a follow-up appointment in: [place] physician has recommended you make the following change in your medication:   START PROPRANOLOL 10 MG THREE TIMES DAILY AS NEEDED   You have been referred to [facility] has requested that you have an echocardiogram. Echocardiography is a painless test that uses sound waves to create images of your heart. It provides your doctor with information about the size and shape of your heart and how well your heart's chambers and valves are working. This procedure takes approximately one hour. There are no restrictions for this procedure.  Thank you for choosing [facility]!!

## 2020-06-12 ENCOUNTER — Other Ambulatory Visit: Payer: Self-pay

## 2020-06-12 ENCOUNTER — Ambulatory Visit (HOSPITAL_COMMUNITY)
Admission: RE | Admit: 2020-06-12 | Discharge: 2020-06-12 | Disposition: A | Discharge status: Home / Self Care | Payer: 59 | Source: Ambulatory Visit | Attending: Cardiology | Admitting: Cardiology

## 2020-06-12 DIAGNOSIS — R002 Palpitations: Secondary | ICD-10-CM | POA: Diagnosis present

## 2020-06-12 LAB — ECHOCARDIOGRAM COMPLETE
AR max vel: 3.04 cm2
AV Area VTI: 3.09 cm2
AV Area mean vel: 3.17 cm2
AV Mean grad: 2.1 mmHg
AV Peak grad: 4.1 mmHg
Ao pk vel: 1.02 m/s
Area-P 1/2: 3.91 cm2
S' Lateral: 2.85 cm

## 2020-06-12 NOTE — Progress Notes (Signed)
*  PRELIMINARY RESULTS* Echocardiogram 2D Echocardiogram has been performed.  Leavy Cella 06/12/2020, 9:11 AM

## 2020-06-15 ENCOUNTER — Encounter (INDEPENDENT_AMBULATORY_CARE_PROVIDER_SITE_OTHER): Payer: Self-pay | Admitting: Internal Medicine

## 2020-06-16 ENCOUNTER — Other Ambulatory Visit (INDEPENDENT_AMBULATORY_CARE_PROVIDER_SITE_OTHER): Payer: Self-pay | Admitting: Internal Medicine

## 2020-06-16 DIAGNOSIS — Z1211 Encounter for screening for malignant neoplasm of colon: Secondary | ICD-10-CM

## 2020-06-24 ENCOUNTER — Telehealth: Payer: Self-pay | Admitting: *Deleted

## 2020-06-24 NOTE — Telephone Encounter (Signed)
Pt said he was returning a call from late yesterday afternoon. [phone]

## 2020-06-24 NOTE — Progress Notes (Signed)
Spoke with pt and he said that everything checked out good with his heart. He is ready to schedule colonoscopy.  Aliene Altes, PA: Is it ok to proceed with scheduling?

## 2020-06-24 NOTE — Telephone Encounter (Signed)
Spoke to pt and he is ready to proceed with colonoscopy.  Sent message to Aliene Altes, PA to make sure it is ok to proceed since his visit with cardiology.

## 2020-06-25 NOTE — Progress Notes (Signed)
Noted. Reviewed recent cardiology visit. Stated, "The patient is due to have a colonoscopy and is at acceptable risk for this.  No further testing is needed prior to this."   Lets get him scheduled for an OV to arrange procedure. History of COPD meets ASA III criteria.

## 2020-06-26 ENCOUNTER — Ambulatory Visit: Payer: 59 | Admitting: Cardiology

## 2020-07-02 ENCOUNTER — Telehealth: Payer: Self-pay | Admitting: *Deleted

## 2020-07-02 NOTE — Telephone Encounter (Signed)
Tried to call pt.  VM has not been set up.  Pt needs appt to schedule procedure.

## 2020-07-03 NOTE — Telephone Encounter (Signed)
Tried to call pt again.  VM not set up.

## 2020-07-06 ENCOUNTER — Encounter: Payer: Self-pay | Admitting: *Deleted

## 2020-07-06 NOTE — Telephone Encounter (Signed)
VM not set up.  Mailing letter to pt to contact our office.  Pt needs ov to arrange colonoscopy.

## 2020-07-06 NOTE — Telephone Encounter (Signed)
Tried to call pt. Vm not set up.  

## 2020-07-10 ENCOUNTER — Telehealth: Payer: Self-pay | Admitting: *Deleted

## 2020-07-10 NOTE — Telephone Encounter (Signed)
Tried to call pt back but vm not set up. 

## 2020-07-10 NOTE — Telephone Encounter (Signed)
PATIENT RETURNED CALL, PLEASE CALL BACK WHEN YOU CAN  

## 2020-08-05 NOTE — Telephone Encounter (Signed)
VM not set up.

## 2020-08-05 NOTE — Progress Notes (Signed)
Tried to reach pt multiple times with no success.  Letter was mailed on 07/06/2020.

## 2020-09-18 ENCOUNTER — Ambulatory Visit: Payer: 59 | Admitting: Cardiology

## 2020-10-22 ENCOUNTER — Encounter (INDEPENDENT_AMBULATORY_CARE_PROVIDER_SITE_OTHER): Payer: Self-pay | Admitting: Internal Medicine

## 2020-10-22 ENCOUNTER — Telehealth (INDEPENDENT_AMBULATORY_CARE_PROVIDER_SITE_OTHER): Payer: 59 | Admitting: Internal Medicine

## 2020-10-22 VITALS — Temp 99.0°F | Ht 74.0 in

## 2020-10-22 DIAGNOSIS — U071 COVID-19: Secondary | ICD-10-CM

## 2020-10-22 NOTE — Progress Notes (Signed)
Metrics: Intervention Frequency ACO  Documented Smoking Status Yearly  Screened one or more times in 24 months  Cessation Counseling or  Active cessation medication Past 24 months  Past 24 months   Guideline developer: UpToDate (See UpToDate for funding source) Date Released: 2014       Wellness Office Visit  Subjective:  Patient ID: Robert Yates, male    DOB: 07/30/1961  Age: 60 y.o. MRN: [id number]  CC: This is an audio telemedicine visit with the permission of the patient who is at home and I am in my office.  I used to identifies to identify the patient. COVID-19 disease. HPI  The patient started having symptoms of chest congestion, sore throat, body aches 3 days ago.  He had previous exposure to someone that was positive for COVID.  He eventually did get a COVID-19 test this morning and it was positive. He has had only 1 vaccine of [employer] back in April 2021.  He has not had any further vaccine doses. He has underlying COPD.  He says that he is not short of breath at the present time. Past Medical History:  Diagnosis Date  . COPD (chronic obstructive pulmonary disease) ([place])   . Palpitations    History reviewed. No pertinent surgical history.   Family History  Adopted: Yes  Problem Relation Age of Onset  . 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   Social History   Tobacco Use  . Smoking status: Current Some Day Smoker    Packs/day: 1.00    Types: Cigarettes    Start date: 11/07/1974  . Smokeless tobacco: Never Used  Substance Use Topics  . Alcohol use: Yes    Comment: occasional    Current Meds  Medication Sig  . BREO ELLIPTA 200-25 MCG/INH AEPB daily.   Marland Kitchen ibuprofen (ADVIL) 400 MG tablet Take 400 mg by mouth as needed.   . Ibuprofen-diphenhydrAMINE HCl (ADVIL PM) 200-25 MG CAPS Take by mouth at  bedtime.  . Melatonin 10 MG CAPS Take by mouth as needed.  Marland Kitchen VITAMIN D PO Take by mouth 3 (three) times a week.      Depression screen [facility] 2/9 01/14/2020 12/16/2019  Decreased Interest 0 0  Down, Depressed, Hopeless 0 0  PHQ - 2 Score 0 0     Objective:   Today's Vitals: Temp 99 F (37.2 C) (Temporal)   Ht 6\' 2"  (1.88 m)   BMI 22.31 kg/m  Vitals with BMI 10/22/2020 06/05/2020 01/14/2020  Height 6\' 2"  6\' 2"  6\' 2"   Weight (No Data) 173 lbs 13 oz 174 lbs 6 oz  BMI - 56.21 30.86  Systolic (No Data) 578 469  Diastolic (No Data) 80 70  Pulse (No Data) 80 92     Physical Exam  Virtual visit.  He appears to be alert and orientated on the phone.     Assessment   1. COVID-19       Tests ordered Orders Placed This Encounter  Procedures  . Ambulatory Referral for Covid Treatment     Plan: 1. Since he is at high risk and he has only had 1 dose of COVID-19 vaccine which was [employer], there is a possibility that he may get worse and need hospitalization.  I have referred him to ambulatory therapy for COVID treatment and hopefully he  will get a phone call although supplies are short.  In the meantime, he will keep well-hydrated, take vitamin D3 and vitamin C.  If he gets worse especially with his breathing, I recommended he go to the emergency room to be evaluated further. 2. This phone call lasted 5 minutes and 22 seconds.   No orders of the defined types were placed in this encounter.   Doree Albee, MD

## 2020-12-01 ENCOUNTER — Ambulatory Visit (INDEPENDENT_AMBULATORY_CARE_PROVIDER_SITE_OTHER): Payer: 59 | Admitting: Internal Medicine

## 2020-12-22 ENCOUNTER — Ambulatory Visit (INDEPENDENT_AMBULATORY_CARE_PROVIDER_SITE_OTHER): Payer: 59 | Admitting: Internal Medicine

## 2020-12-22 ENCOUNTER — Other Ambulatory Visit: Payer: Self-pay

## 2020-12-22 ENCOUNTER — Encounter (INDEPENDENT_AMBULATORY_CARE_PROVIDER_SITE_OTHER): Payer: Self-pay | Admitting: Internal Medicine

## 2020-12-22 VITALS — BP 108/72 | HR 92 | Temp 97.3°F | Ht 74.0 in | Wt 171.8 lb

## 2020-12-22 DIAGNOSIS — G4700 Insomnia, unspecified: Secondary | ICD-10-CM

## 2020-12-22 DIAGNOSIS — E291 Testicular hypofunction: Secondary | ICD-10-CM

## 2020-12-22 NOTE — Progress Notes (Signed)
Metrics: Intervention Frequency ACO  Documented Smoking Status Yearly  Screened one or more times in 24 months  Cessation Counseling or  Active cessation medication Past 24 months  Past 24 months   Guideline developer: UpToDate (See UpToDate for funding source) Date Released: 2014       Wellness Office Visit  Subjective:  Patient ID: Robert Yates, male    DOB: 02-18-1961  Age: 60 y.o. MRN: [id number]  CC: This man comes in to discuss testosterone levels. HPI  In April 2021 we check testosterone levels and total testosterone levels were normal but free testosterone levels were low.  He still has issues with decreased libido. Past Medical History:  Diagnosis Date  . COPD (chronic obstructive pulmonary disease) ([place])   . Palpitations    History reviewed. No pertinent surgical history.   Family History  Adopted: Yes  Problem Relation Age of Onset  . 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   Social History   Tobacco Use  . Smoking status: Current Some Day Smoker    Packs/day: 1.00    Types: Cigarettes    Start date: 11/07/1974  . Smokeless tobacco: Never Used  Substance Use Topics  . Alcohol use: Yes    Comment: occasional    Current Meds  Medication Sig  . BREO ELLIPTA 200-25 MCG/INH AEPB daily.   Marland Kitchen ibuprofen (ADVIL) 400 MG tablet Take 400 mg by mouth as needed.   . Ibuprofen-diphenhydrAMINE HCl (ADVIL PM) 200-25 MG CAPS Take by mouth at bedtime.  Marland Kitchen VITAMIN D PO Take by mouth 3 (three) times a week.     [place] Office Visit from 12/22/2020 in [facility]  PHQ-9 Total Score 0      Objective:   Today's Vitals: BP 108/72   Pulse 92   Temp (!) 97.3 F (36.3 C) (Temporal)   Ht 6\' 2"  (1.88 m)   Wt 171 lb 12.8 oz (77.9 kg)   SpO2 96%   BMI 22.06 kg/m  Vitals with BMI 12/22/2020 10/22/2020  06/05/2020  Height 6\' 2"  6\' 2"  6\' 2"   Weight 171 lbs 13 oz (No Data) 173 lbs 13 oz  BMI 93.79 - 02.40  Systolic 973 (No Data) 532  Diastolic 72 (No Data) 80  Pulse 92 (No Data) 80     Physical Exam  He looks systemically well.  No new physical findings.    Assessment   1. Insomnia, unspecified type   2. Testicular failure       Tests ordered No orders of the defined types were placed in this encounter.    Plan: 1. He continues to have difficulties with insomnia and he has tried melatonin for many places. 2. We discussed testosterone therapy, FDA warnings, benefits and side effects and he will think about it some more and discuss it with his partner. 3. In the meantime I will schedule an annual physical with Judson Roch in 6 months time.   No orders of the defined types were placed in this encounter.   Doree Albee, MD

## 2021-04-19 ENCOUNTER — Telehealth (INDEPENDENT_AMBULATORY_CARE_PROVIDER_SITE_OTHER): Payer: Self-pay

## 2021-04-19 DIAGNOSIS — J449 Chronic obstructive pulmonary disease, unspecified: Secondary | ICD-10-CM

## 2021-04-19 DIAGNOSIS — U071 COVID-19: Secondary | ICD-10-CM

## 2021-04-19 DIAGNOSIS — Z7189 Other specified counseling: Secondary | ICD-10-CM

## 2021-04-19 NOTE — Telephone Encounter (Signed)
Referral approved

## 2021-05-04 ENCOUNTER — Ambulatory Visit
Admission: EM | Admit: 2021-05-04 | Discharge: 2021-05-04 | Disposition: A | Discharge status: Home / Self Care | Payer: 59 | Attending: Family Medicine | Admitting: Family Medicine

## 2021-05-04 ENCOUNTER — Ambulatory Visit: Payer: 59

## 2021-05-04 DIAGNOSIS — L255 Unspecified contact dermatitis due to plants, except food: Secondary | ICD-10-CM

## 2021-05-04 MED ORDER — PREDNISONE 10 MG (48) PO TBPK: ORAL_TABLET | ORAL | 0 refills | Status: DC

## 2021-05-04 NOTE — ED Provider Notes (Signed)
  [facility]   [id number] 05/04/21 Arrival Time: [id number]  ASSESSMENT & PLAN:  1. Rhus dermatitis    No signs of skin infection. Begin: Meds ordered this encounter  Medications   predniSONE (STERAPRED UNI-PAK 48 TAB) 10 MG (48) TBPK tablet    Sig: Take as directed.    Dispense:  48 tablet    Refill:  0   Benadryl as needed.   Follow-up Information     Ailene Ards, NP.   Specialty: Nurse Practitioner Why: If worsening or failing to improve as anticipated. Contact information: [place] [phone]                 Reviewed expectations re: course of current medical issues. Questions answered. Outlined signs and symptoms indicating need for more acute intervention. Patient verbalized understanding. After Visit Summary given.   SUBJECTIVE:  ALLEC CLAYDON is a 60 y.o. male who presents with a skin complaint. Itchy rash; generalized. Noted this week after cleaning brush from under tree. Afebrile. OTC tx without relief.  OBJECTIVE: Vitals:   05/04/21 0952  BP: 127/86  Pulse: 97  Resp: 18  Temp: 97.9 F (36.6 C)  SpO2: 95%    General appearance: alert; no distress HEENT: [place]; AT Neck: supple with FROM Lungs: unlabored Extremities: no edema; moves all extremities normally Skin: warm and dry; signs of infection: yes; areas of linear papules and vesicles with surrounding erythema over trunk and extremities Psychological: alert and cooperative; normal mood and affect  Allergies  Allergen Reactions   Sulfa Antibiotics Rash    Past Medical History:  Diagnosis Date   COPD (chronic obstructive pulmonary disease) ([facility])    Palpitations    Social History   Socioeconomic History   Marital status: Divorced    Spouse name: [name]   Number of children: [name]   Years of education: [name]   Highest education level: [name]  Occupational History   Occupation: farmer  Tobacco Use   Smoking status:  Some Days    Packs/day: 1.00    Types: Cigarettes    Start date: 11/07/1974   Smokeless tobacco: Never  Vaping Use   Vaping Use: Never used  Substance and Sexual Activity   Alcohol use: Yes    Comment: occasional   Drug use: Yes    Comment: Nothing in 30 years   Sexual activity: [name]  Other Topics Concern   [name]  Social History Narrative   Divorced for 20 years,married for 13 years previously.Lives with girlfriend of 2 years.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   Social Determinants of Health   Financial Resource Strain: [name]  Food Insecurity: [name]  Transportation Needs: [name]  Physical Activity: [name]  Stress: [name]  Social Connections: [name]  Intimate Partner Violence: [name]   Family History  Adopted: Yes  Problem Relation Age of Onset   Cancer Brother    History reviewed. No pertinent surgical history.    Vanessa Kick, MD 05/04/21 1010

## 2021-05-04 NOTE — ED Triage Notes (Signed)
Pt presents with possible poison ivy rash on legs and arms for past week

## 2021-06-10 ENCOUNTER — Encounter: Payer: Self-pay | Admitting: Internal Medicine

## 2021-06-10 ENCOUNTER — Other Ambulatory Visit: Payer: Self-pay

## 2021-06-10 ENCOUNTER — Ambulatory Visit (INDEPENDENT_AMBULATORY_CARE_PROVIDER_SITE_OTHER): Payer: 59 | Admitting: Internal Medicine

## 2021-06-10 VITALS — BP 128/62 | HR 88 | Temp 97.7°F | Ht 74.0 in | Wt 174.0 lb

## 2021-06-10 DIAGNOSIS — J449 Chronic obstructive pulmonary disease, unspecified: Secondary | ICD-10-CM | POA: Diagnosis not present

## 2021-06-10 DIAGNOSIS — F17210 Nicotine dependence, cigarettes, uncomplicated: Secondary | ICD-10-CM | POA: Diagnosis not present

## 2021-06-10 MED ORDER — FLUTICASONE FUROATE-VILANTEROL 100-25 MCG/INH IN AEPB: 1.0000 | INHALATION_SPRAY | Freq: Every morning | RESPIRATORY_TRACT | 11 refills | Status: DC

## 2021-06-10 MED ORDER — FLUTICASONE FUROATE-VILANTEROL 100-25 MCG/INH IN AEPB: 1.0000 | INHALATION_SPRAY | Freq: Every day | RESPIRATORY_TRACT | 0 refills | Status: DC

## 2021-06-10 NOTE — Patient Instructions (Addendum)
We need your last set of lung functions from Dr Tamala Julian at [facility]   I will be referring you to Eric Form NP who be  willing calling you to set up a low dose CT chest   Plan A = Automatic = Always=    Breo 100 one each   Stay as physically active as you can ideally about 30 minutes of exercise a day    If you are satisfied with your treatment plan,  let your doctor know and he/she can either refill your medications or you can return here when your prescription runs out.     If in any way you are not 100% satisfied,  please tell us.  If 100% better, tell your friends!  Pulmonary follow up is as needed

## 2021-06-10 NOTE — Assessment & Plan Note (Signed)
Active smoker  -  Onset around 2019 -  Breo 100 one each am  06/10/2021   .relatively mild copd/ AB with noct cough no better with pred ? gerd or non-allergic rhinitis with pnds vs side effect of high dose breo so try breo 100 one each am blow out thru nose and f/u p 6 weeks if not better  If better to his satisfaction f/u can be per pcp

## 2021-06-10 NOTE — Assessment & Plan Note (Signed)
>   3 min discussion I reviewed the Fletcher curve with the patient that basically indicates  if you quit smoking when your best day FEV1 is still well preserved (as is clearly  the case here)  it is highly unlikely you will progress to severe disease and informed the patient there was  no medication on the market that has proven to alter the curve/ its downward trajectory  or the likelihood of progression of their disease(unlike other chronic medical conditions such as atheroclerosis where we do think we can change the natural hx with risk reducing meds)    Therefore stopping smoking and maintaining abstinence are  the most important aspects of care, not choice of inhalers or for that matter, doctors.   Treatment other than smoking cessation  is entirely directed by severity of symptoms and focused also on reducing exacerbations, not attempting to change the natural history of the disease.    Since having min symptoms s aecopd if he does well on breo 100 fu here can be prn   Low-dose CT lung cancer screening is recommended for patients who are 48-102 years of age with a 30+ pack-year history of smoking, and who are currently smoking or quit <=15 years ago.   Discussed in detail all the  indications, usual  risks and alternatives  relative to the benefits with patient who agrees to proceed with w/u as outlined.     >>> referred for shared decision making          Each maintenance medication was reviewed in detail including emphasizing most importantly the difference between maintenance and prns and under what circumstances the prns are to be triggered using an action plan format where appropriate.  Total time for H and P, chart review, counseling, reviewing dpi/elipta device(s) and generating customized AVS unique to this office visit / same day charting = 29mn

## 2021-06-10 NOTE — Progress Notes (Signed)
Robert Yates, male    DOB: Oct 09, 1961,    MRN: [id number]   Brief patient profile:  60 yo wm   active smoker ran [employer]  referred to pulmonary clinic in [place]  06/10/2021 for copd eval dx in [place] "Stage 2"     History of Present Illness  06/10/2021  Pulmonary/ 1st office eval/ Robert Yates / [place] [facility]  Chief Complaint  Patient presents with   Follow-up    Some SOB when walking with exertion but not when sitting still normally. Coughing a lot while laying down, sometimes coughs up mucus with yellow and clear. Switched to Dr. Melvyn Novas for location purposes and medication. No oxygen or CPAP at home     Dyspnea:  MMRC1 = can walk nl pace, flat grade, can't hurry or go uphills or steps s sob  on Breo 200  Cough: minimal at hs most noct  Sleep: bed is flat, one pillow does better when it's cool SABA use: none  Lung cancer screening :  referred for early decision making  No obvious day to day or daytime variability or assoc excess/ purulent sputum or mucus plugs or hemoptysis or cp or chest tightness, subjective wheeze or overt sinus or hb symptoms.     Also denies any obvious fluctuation of symptoms with weather or environmental changes or other aggravating or alleviating factors except as outlined above   No unusual exposure hx or h/o childhood pna/ asthma or knowledge of premature birth.  Current Allergies, Complete Past Medical History, Past Surgical History, Family History, and Social History were reviewed in [employer] record.  ROS  The following are not active complaints unless bolded Hoarseness, sore throat, dysphagia, dental problems, itching, sneezing,  nasal congestion or discharge of excess mucus or purulent secretions, ear ache,   fever, chills, sweats, unintended wt loss or wt gain, classically pleuritic or exertional cp,  orthopnea pnd or arm/hand swelling  or leg swelling, presyncope, palpitations, abdominal pain, anorexia, nausea, vomiting,  diarrhea  or change in bowel habits or change in bladder habits, change in stools or change in urine, dysuria, hematuria,  rash, arthralgias, visual complaints, headache, numbness, weakness or ataxia or problems with walking or coordination,  change in mood or  memory.           Past Medical History:  Diagnosis Date   COPD (chronic obstructive pulmonary disease) ([place])    Palpitations     Outpatient Medications Prior to Visit  Medication Sig Dispense Refill   BREO ELLIPTA 200-25 MCG/INH AEPB daily.      ibuprofen (ADVIL) 400 MG tablet Take 400 mg by mouth as needed.      Ibuprofen-diphenhydrAMINE HCl (ADVIL PM) 200-25 MG CAPS Take by mouth at bedtime.     Melatonin 10 MG CAPS Take by mouth as needed.        48 tablet 0   VITAMIN D PO Take by mouth 3 (three) times a week.     No facility-administered medications prior to visit.     Objective:     BP 128/62 (BP Location: Left Arm, Patient Position: Sitting, Cuff Size: Normal)   Pulse 88   Temp 97.7 F (36.5 C) (Oral)   Ht '6\' 2"'$  (1.88 m)   Wt 174 lb (78.9 kg)   SpO2 98% Comment: RA  BMI 22.34 kg/m   SpO2: 98 % (RA)  Thin pleasant wm nad   HEENT : pt wearing mask not removed for exam due to covid -  19 concerns.   NECK :  without JVD/Nodes/TM/ nl carotid upstrokes bilaterally   LUNGS: no acc muscle use,  Min barrel  contour chest wall with bilateral  slightly decreased bs s audible wheeze and  without cough on insp or exp maneuvers and min  Hyperresonant  to  percussion bilaterally     CV:  RRR  no s3 or murmur or increase in P2, and no edema   ABD:  soft and nontender with pos end  insp Hoover's  in the supine position. No bruits or organomegaly appreciated, bowel sounds nl  MS:   Nl gait/  ext warm without deformities, calf tenderness, cyanosis or clubbing No obvious joint restrictions   SKIN: warm and dry without lesions    NEURO:  alert, approp, nl sensorium with  no motor or cerebellar deficits apparent.              Assessment   COPD  GOLD ? / active smoking  Active smoker  -  Onset around 2019 -  Breo 100 one each am  06/10/2021   .relatively mild copd/ AB with noct cough no better with pred ? gerd or non-allergic rhinitis with pnds vs side effect of high dose breo so try breo 100 one each am blow out thru nose and f/u p 6 weeks if not better  If better to his satisfaction f/u can be per pcp    Cigarette smoker > 3 min discussion I reviewed the Fletcher curve with the patient that basically indicates  if you quit smoking when your best day FEV1 is still well preserved (as is clearly  the case here)  it is highly unlikely you will progress to severe disease and informed the patient there was  no medication on the market that has proven to alter the curve/ its downward trajectory  or the likelihood of progression of their disease(unlike other chronic medical conditions such as atheroclerosis where we do think we can change the natural hx with risk reducing meds)    Therefore stopping smoking and maintaining abstinence are  the most important aspects of care, not choice of inhalers or for that matter, doctors.   Treatment other than smoking cessation  is entirely directed by severity of symptoms and focused also on reducing exacerbations, not attempting to change the natural history of the disease.    Since having min symptoms s aecopd if he does well on breo 100 fu here can be prn   Low-dose CT lung cancer screening is recommended for patients who are 64-31 years of age with a 30+ pack-year history of smoking, and who are currently smoking or quit <=15 years ago.   Discussed in detail all the  indications, usual  risks and alternatives  relative to the benefits with patient who agrees to proceed with w/u as outlined.     >>> referred for shared decision making          Each maintenance medication was reviewed in detail including emphasizing most importantly the difference between  maintenance and prns and under what circumstances the prns are to be triggered using an action plan format where appropriate.  Total time for H and P, chart review, counseling, reviewing dpi/elipta device(s) and generating customized AVS unique to this office visit / same day charting = 90mn           MChristinia Gully MD 06/10/2021

## 2021-06-23 ENCOUNTER — Other Ambulatory Visit: Payer: Self-pay

## 2021-06-23 ENCOUNTER — Inpatient Hospital Stay (HOSPITAL_COMMUNITY)
Admission: EM | Admit: 2021-06-23 | Discharge: 2021-06-25 | DRG: 446 | Disposition: A | Discharge status: Home / Self Care | Payer: 59 | Attending: Internal Medicine | Admitting: Internal Medicine

## 2021-06-23 ENCOUNTER — Emergency Department (HOSPITAL_COMMUNITY): Payer: 59

## 2021-06-23 ENCOUNTER — Encounter (HOSPITAL_COMMUNITY): Payer: Self-pay

## 2021-06-23 DIAGNOSIS — Z20822 Contact with and (suspected) exposure to covid-19: Secondary | ICD-10-CM | POA: Diagnosis present

## 2021-06-23 DIAGNOSIS — Z79899 Other long term (current) drug therapy: Secondary | ICD-10-CM

## 2021-06-23 DIAGNOSIS — K8020 Calculus of gallbladder without cholecystitis without obstruction: Secondary | ICD-10-CM

## 2021-06-23 DIAGNOSIS — K831 Obstruction of bile duct: Secondary | ICD-10-CM

## 2021-06-23 DIAGNOSIS — K810 Acute cholecystitis: Secondary | ICD-10-CM

## 2021-06-23 DIAGNOSIS — I493 Ventricular premature depolarization: Secondary | ICD-10-CM | POA: Diagnosis present

## 2021-06-23 DIAGNOSIS — K760 Fatty (change of) liver, not elsewhere classified: Secondary | ICD-10-CM | POA: Diagnosis present

## 2021-06-23 DIAGNOSIS — K8063 Calculus of gallbladder and bile duct with acute cholecystitis with obstruction: Principal | ICD-10-CM | POA: Diagnosis present

## 2021-06-23 DIAGNOSIS — F17200 Nicotine dependence, unspecified, uncomplicated: Secondary | ICD-10-CM

## 2021-06-23 DIAGNOSIS — Z720 Tobacco use: Secondary | ICD-10-CM

## 2021-06-23 DIAGNOSIS — R1011 Right upper quadrant pain: Secondary | ICD-10-CM

## 2021-06-23 DIAGNOSIS — Z7951 Long term (current) use of inhaled steroids: Secondary | ICD-10-CM | POA: Diagnosis not present

## 2021-06-23 DIAGNOSIS — R040 Epistaxis: Secondary | ICD-10-CM | POA: Diagnosis not present

## 2021-06-23 DIAGNOSIS — F17210 Nicotine dependence, cigarettes, uncomplicated: Secondary | ICD-10-CM | POA: Diagnosis present

## 2021-06-23 DIAGNOSIS — Z800 Family history of malignant neoplasm of digestive organs: Secondary | ICD-10-CM | POA: Diagnosis not present

## 2021-06-23 DIAGNOSIS — K8021 Calculus of gallbladder without cholecystitis with obstruction: Secondary | ICD-10-CM | POA: Diagnosis not present

## 2021-06-23 DIAGNOSIS — R7989 Other specified abnormal findings of blood chemistry: Secondary | ICD-10-CM

## 2021-06-23 DIAGNOSIS — K819 Cholecystitis, unspecified: Secondary | ICD-10-CM | POA: Diagnosis not present

## 2021-06-23 DIAGNOSIS — J449 Chronic obstructive pulmonary disease, unspecified: Secondary | ICD-10-CM | POA: Diagnosis present

## 2021-06-23 LAB — BASIC METABOLIC PANEL
Anion gap: 9 (ref 5–15)
BUN: 10 mg/dL (ref 8–23)
CO2: 25 mmol/L (ref 22–32)
Calcium: 9.6 mg/dL (ref 8.9–10.3)
Chloride: 104 mmol/L (ref 98–111)
Creatinine, Ser: 0.76 mg/dL (ref 0.61–1.24)
GFR, Estimated: >60 mL/min (ref >60)
Glucose, Bld: 108 mg/dL — ABNORMAL HIGH (ref 70–99)
Potassium: 4.3 mmol/L (ref 3.5–5.1)
Sodium: 138 mmol/L (ref 135–145)

## 2021-06-23 LAB — HEPATIC FUNCTION PANEL
ALT: 355 U/L — ABNORMAL HIGH (ref 0–44)
AST: 447 U/L — ABNORMAL HIGH (ref 15–41)
Albumin: 4.5 g/dL (ref 3.5–5.0)
Alkaline Phosphatase: 97 U/L (ref 38–126)
Bilirubin, Direct: 0.3 mg/dL — ABNORMAL HIGH (ref 0.0–0.2)
Indirect Bilirubin: 1.1 mg/dL — ABNORMAL HIGH (ref 0.3–0.9)
Total Bilirubin: 1.4 mg/dL — ABNORMAL HIGH (ref 0.3–1.2)
Total Protein: 7.6 g/dL (ref 6.5–8.1)

## 2021-06-23 LAB — CBC
HCT: 56.1 % — ABNORMAL HIGH (ref 39.0–52.0)
Hemoglobin: 18.1 g/dL — ABNORMAL HIGH (ref 13.0–17.0)
MCH: 29.9 pg (ref 26.0–34.0)
MCHC: 32.3 g/dL (ref 30.0–36.0)
MCV: 92.7 fL (ref 80.0–100.0)
Platelets: 246 10*3/uL (ref 150–400)
RBC: 6.05 MIL/uL — ABNORMAL HIGH (ref 4.22–5.81)
RDW: 14.3 % (ref 11.5–15.5)
WBC: 7.9 10*3/uL (ref 4.0–10.5)
nRBC: 0 % (ref 0.0–0.2)

## 2021-06-23 LAB — TROPONIN I (HIGH SENSITIVITY)
Troponin I (High Sensitivity): 3 ng/L (ref <18)
Troponin I (High Sensitivity): 3 ng/L (ref <18)

## 2021-06-23 LAB — HIV ANTIBODY (ROUTINE TESTING W REFLEX): HIV Screen 4th Generation wRfx: NONREACTIVE

## 2021-06-23 LAB — LIPASE, BLOOD: Lipase: 39 U/L (ref 11–51)

## 2021-06-23 LAB — MAGNESIUM: Magnesium: 2.3 mg/dL (ref 1.7–2.4)

## 2021-06-23 MED ORDER — CEFTRIAXONE SODIUM 2 G IJ SOLR: 2.0000 g | INTRAMUSCULAR | Status: DC

## 2021-06-23 MED ORDER — SODIUM CHLORIDE 0.9 % IV SOLN
2.0000 g | INTRAVENOUS | Status: DC
  Administered 2021-06-23: 2 g via INTRAVENOUS

## 2021-06-23 MED ORDER — KETOROLAC TROMETHAMINE 15 MG/ML IJ SOLN: 15.0000 mg | Freq: Four times a day (QID) | INTRAMUSCULAR | Status: DC | PRN

## 2021-06-23 MED ORDER — KETOROLAC TROMETHAMINE 15 MG/ML IJ SOLN
15.0000 mg | Freq: Four times a day (QID) | INTRAMUSCULAR | Status: DC | PRN
  Administered 2021-06-23: 15 mg via INTRAVENOUS
  Filled 2021-06-23: qty 1

## 2021-06-23 MED ORDER — METRONIDAZOLE 500 MG/100ML IV SOLN
500.0000 mg | Freq: Three times a day (TID) | INTRAVENOUS | Status: DC
  Administered 2021-06-23 – 2021-06-24 (×3): 500 mg via INTRAVENOUS
  Filled 2021-06-23 (×3): qty 100

## 2021-06-23 MED ORDER — SODIUM CHLORIDE 0.9 % IV SOLN
2.0000 g | Freq: Once | INTRAVENOUS | Status: DC
  Filled 2021-06-23: qty 20

## 2021-06-23 MED ORDER — SODIUM CHLORIDE 0.9 % IV SOLN
1.0000 g | INTRAVENOUS | Status: DC
Start: 2021-06-24 — End: 2021-06-23

## 2021-06-23 MED ORDER — GADOBUTROL 1 MMOL/ML IV SOLN
7.5000 mL | Freq: Once | INTRAVENOUS | Status: AC | PRN
  Administered 2021-06-23: 7.5 mL via INTRAVENOUS

## 2021-06-23 MED ORDER — METOPROLOL TARTRATE 25 MG PO TABS
12.5000 mg | ORAL_TABLET | Freq: Two times a day (BID) | ORAL | Status: DC
  Administered 2021-06-24: 12.5 mg via ORAL
  Filled 2021-06-23 (×4): qty 1

## 2021-06-23 MED ORDER — ONDANSETRON HCL 4 MG/2ML IJ SOLN
4.0000 mg | Freq: Once | INTRAMUSCULAR | Status: AC
  Administered 2021-06-23: 4 mg via INTRAVENOUS
  Filled 2021-06-23 (×2): qty 2

## 2021-06-23 MED ORDER — MELATONIN 3 MG PO TABS: 3.0000 mg | ORAL_TABLET | Freq: Every day | ORAL | Status: DC

## 2021-06-23 MED ORDER — ENOXAPARIN SODIUM 40 MG/0.4ML IJ SOSY
40.0000 mg | PREFILLED_SYRINGE | INTRAMUSCULAR | Status: DC
  Administered 2021-06-23: 40 mg via SUBCUTANEOUS
  Filled 2021-06-23 (×2): qty 0.4

## 2021-06-23 MED ORDER — BISACODYL 5 MG PO TBEC: 5.0000 mg | DELAYED_RELEASE_TABLET | Freq: Every day | ORAL | Status: DC | PRN

## 2021-06-23 MED ORDER — VITAMIN B-12 1000 MCG PO TABS
1000.0000 ug | ORAL_TABLET | Freq: Every day | ORAL | Status: DC
  Administered 2021-06-23 – 2021-06-25 (×3): 1000 ug via ORAL
  Filled 2021-06-23 (×3): qty 1

## 2021-06-23 MED ORDER — SODIUM CHLORIDE 0.9 % IV SOLN: INTRAVENOUS | Status: AC

## 2021-06-23 MED ORDER — HYDROMORPHONE HCL 1 MG/ML IJ SOLN
0.5000 mg | INTRAMUSCULAR | Status: DC | PRN
Start: 2021-06-23 — End: 2021-06-25
  Administered 2021-06-24 (×2): 0.5 mg via INTRAVENOUS
  Filled 2021-06-23 (×2): qty 1

## 2021-06-23 MED ORDER — HYDROMORPHONE HCL 1 MG/ML IJ SOLN
0.5000 mg | Freq: Once | INTRAMUSCULAR | Status: AC
  Administered 2021-06-23: 0.5 mg via INTRAVENOUS
  Filled 2021-06-23 (×2): qty 1

## 2021-06-23 MED ORDER — ONDANSETRON HCL 4 MG PO TABS: 4.0000 mg | ORAL_TABLET | Freq: Four times a day (QID) | ORAL | Status: DC | PRN

## 2021-06-23 MED ORDER — SODIUM CHLORIDE 0.9 % IV BOLUS
1000.0000 mL | Freq: Once | INTRAVENOUS | Status: AC
  Administered 2021-06-23: 1000 mL via INTRAVENOUS

## 2021-06-23 MED ORDER — FLUTICASONE FUROATE-VILANTEROL 100-25 MCG/INH IN AEPB
1.0000 | INHALATION_SPRAY | Freq: Every morning | RESPIRATORY_TRACT | Status: DC
  Administered 2021-06-24: 1 via RESPIRATORY_TRACT
  Filled 2021-06-23: qty 28

## 2021-06-23 MED ORDER — ONDANSETRON HCL 4 MG/2ML IJ SOLN: 4.0000 mg | Freq: Four times a day (QID) | INTRAMUSCULAR | Status: DC | PRN

## 2021-06-23 MED ORDER — NICOTINE 21 MG/24HR TD PT24
21.0000 mg | MEDICATED_PATCH | Freq: Every day | TRANSDERMAL | Status: DC
  Administered 2021-06-23: 21 mg via TRANSDERMAL
  Filled 2021-06-23 (×3): qty 1

## 2021-06-23 MED ORDER — ACETAMINOPHEN 500 MG PO TABS
1000.0000 mg | ORAL_TABLET | Freq: Four times a day (QID) | ORAL | Status: DC | PRN
  Administered 2021-06-25: 1000 mg via ORAL
  Filled 2021-06-23: qty 2

## 2021-06-23 MED ORDER — ZOLPIDEM TARTRATE 5 MG PO TABS
5.0000 mg | ORAL_TABLET | Freq: Every day | ORAL | Status: DC
  Administered 2021-06-23 – 2021-06-24 (×2): 5 mg via ORAL
  Filled 2021-06-23 (×2): qty 1

## 2021-06-23 MED ORDER — ONDANSETRON HCL 4 MG/2ML IJ SOLN
4.0000 mg | Freq: Four times a day (QID) | INTRAMUSCULAR | Status: DC | PRN
  Administered 2021-06-24 (×2): 4 mg via INTRAVENOUS
  Filled 2021-06-23 (×2): qty 2

## 2021-06-23 NOTE — H&P (Signed)
History and Physical    Robert Yates [id number] DOB: 1960-10-18 DOA: 06/23/2021  PCP: Ailene Ards, NP (Confirm with patient/family/NH records and if not entered, this has to be entered at [facility] point of entry) Patient coming from: Home  I have personally briefly reviewed patient's old medical records in [place]  Chief Complaint: Chest pain, abd pain  HPI: Robert Yates is a 60 y.o. male with medical history significant of COPD, chronic palpitations, cigarette smoker, presented with new onset of epigastric pain and chest pain.  Symptoms started this morning after eating breakfast, patient started to experience severe cramping like epigastric pain radiated to the chest and right shoulder, worsening with deep breath, has episode of chills but no fever, feeling nauseous but no vomiting.  He called ambulance but when ambulance came he symptoms relieved.  But later the pain came back again and became persistent on the right upper quadrant, worsening with deep breath and movement and he called EMS again.  ED Course: Troponin negative x2, EKG showed no acute ST changes.  Blood work showed WBC 6.0, elevated LFTs AST 400 and ALT 350, CT abdomen showed signs of acute cholecystitis and mild dilation of CBD.  Review of Systems: As per HPI otherwise 14 point review of systems negative.    Past Medical History:  Diagnosis Date   COPD (chronic obstructive pulmonary disease) ([place])    Palpitations     Past Surgical History:  Procedure Laterality Date   TONSILLECTOMY       reports that he has been smoking cigarettes. He started smoking about 46 years ago. He has been smoking an average of 1 pack per day. He has never used smokeless tobacco. He reports current alcohol use. He reports that he does not currently use drugs.  Allergies  Allergen Reactions   Sulfa Antibiotics Rash    Family History  Adopted: Yes  Problem Relation Age of Onset   Cancer Brother      Prior to Admission  medications   Medication Sig Start Date End Date Taking? Authorizing Provider  acetaminophen (TYLENOL) 500 MG tablet Take 1,000 mg by mouth every 6 (six) hours as needed.   Yes [provider]  fluticasone furoate-vilanterol (BREO ELLIPTA) 100-25 MCG/INH AEPB Inhale 1 puff into the lungs every morning. 06/10/21  Yes Tanda Rockers, MD  ibuprofen (ADVIL) 400 MG tablet Take 400 mg by mouth as needed.    Yes [provider]  Ibuprofen-diphenhydrAMINE HCl (ADVIL PM) 200-25 MG CAPS Take by mouth at bedtime.   Yes [provider]  vitamin B-12 (CYANOCOBALAMIN) 1000 MCG tablet Take 1,000 mcg by mouth daily.   Yes [provider]  fluticasone furoate-vilanterol (BREO ELLIPTA) 100-25 MCG/INH AEPB Inhale 1 puff into the lungs daily for 14 doses. Patient not taking: No sig reported 06/10/21 06/24/21  Tanda Rockers, MD  fluticasone furoate-vilanterol (BREO ELLIPTA) 100-25 MCG/INH AEPB Inhale 1 puff into the lungs daily for 14 doses. Patient not taking: No sig reported 06/10/21 06/24/21  Tanda Rockers, MD  Melatonin 10 MG CAPS Take by mouth as needed. Patient not taking: No sig reported    [provider]  VITAMIN D PO Take by mouth 3 (three) times a week. Patient not taking: No sig reported    [provider]    Physical Exam: Vitals:   06/23/21 1600 06/23/21 1630 06/23/21 1700 06/23/21 1830  BP: 126/81 128/79 140/90 (!) 151/87  Pulse: 60 87 81 81  Resp:  $'19 19 18 17  'B$ Temp:      TempSrc:      SpO2: 97% 97% 96% 96%  Weight:      Height:        Constitutional: NAD, calm, comfortable Vitals:   06/23/21 1600 06/23/21 1630 06/23/21 1700 06/23/21 1830  BP: 126/81 128/79 140/90 (!) 151/87  Pulse: 60 87 81 81  Resp: '19 19 18 17  '$ Temp:      TempSrc:      SpO2: 97% 97% 96% 96%  Weight:      Height:       Eyes: PERRL, lids and conjunctivae normal ENMT: Mucous membranes are moist. Posterior pharynx clear of any exudate or lesions.Normal dentition.   Neck: normal, supple, no masses, no thyromegaly Respiratory: clear to auscultation bilaterally, no wheezing, no crackles. Normal respiratory effort. No accessory muscle use.  Cardiovascular: Regular rate and rhythm, no murmurs / rubs / gallops. No extremity edema. 2+ pedal pulses. No carotid bruits.  Abdomen: tenderness on RUQ, no rebound, no guarding, no masses palpated. No hepatosplenomegaly. Bowel sounds positive.  Musculoskeletal: no clubbing / cyanosis. No joint deformity upper and lower extremities. Good ROM, no contractures. Normal muscle tone.  Skin: no rashes, lesions, ulcers. No induration Neurologic: CN 2-12 grossly intact. Sensation intact, DTR normal. Strength 5/5 in all 4.  Psychiatric: Normal judgment and insight. Alert and oriented x 3. Normal mood.     Labs on Admission: I have personally reviewed following labs and imaging studies  CBC: Recent Labs  Lab 06/23/21 1355  WBC 7.9  HGB 18.1*  HCT 56.1*  MCV 92.7  PLT 0000000   Basic Metabolic Panel: Recent Labs  Lab 06/23/21 1355  NA 138  K 4.3  CL 104  CO2 25  GLUCOSE 108*  BUN 10  CREATININE 0.76  CALCIUM 9.6   GFR: Estimated Creatinine Clearance: 106.8 mL/min (by C-G formula based on SCr of 0.76 mg/dL). Liver Function Tests: Recent Labs  Lab 06/23/21 1355  AST 447*  ALT 355*  ALKPHOS 97  BILITOT 1.4*  PROT 7.6  ALBUMIN 4.5   Recent Labs  Lab 06/23/21 1355  LIPASE 39   No results for input(s): AMMONIA in the last 168 hours. Coagulation Profile: No results for input(s): INR, PROTIME in the last 168 hours. Cardiac Enzymes: No results for input(s): CKTOTAL, CKMB, CKMBINDEX, TROPONINI in the last 168 hours. BNP (last 3 results) No results for input(s): PROBNP in the last 8760 hours. HbA1C: No results for input(s): HGBA1C in the last 72 hours. CBG: No results for input(s): GLUCAP in the last 168 hours. Lipid Profile: No results for input(s): CHOL, HDL, LDLCALC, TRIG, CHOLHDL, LDLDIRECT in the  last 72 hours. Thyroid Function Tests: No results for input(s): TSH, T4TOTAL, FREET4, T3FREE, THYROIDAB in the last 72 hours. Anemia Panel: No results for input(s): VITAMINB12, FOLATE, FERRITIN, TIBC, IRON, RETICCTPCT in the last 72 hours. Urine analysis: No results found for: COLORURINE, APPEARANCEUR, LABSPEC, [place], GLUCOSEU, HGBUR, BILIRUBINUR, KETONESUR, PROTEINUR, UROBILINOGEN, NITRITE, LEUKOCYTESUR  Radiological Exams on Admission: DG Chest 2 View  Result Date: 06/23/2021 CLINICAL DATA:  Chest pain. EXAM: CHEST - 2 VIEW COMPARISON:  November 24, 2006. FINDINGS: The heart size and mediastinal contours are within normal limits. Both lungs are clear. The visualized skeletal structures are unremarkable. IMPRESSION: No active cardiopulmonary disease. Electronically Signed   By: Marijo Conception M.D.   On: 06/23/2021 14:16   US Abdomen Limited RUQ (LIVER/GB)  Result Date: 06/23/2021 CLINICAL DATA:  Right  upper quadrant pain EXAM: ULTRASOUND ABDOMEN LIMITED RIGHT UPPER QUADRANT COMPARISON:  None. FINDINGS: Gallbladder: Small stones and sludge seen in the gallbladder. No gallbladder wall thickening. Positive Murphy sign noted by sonographer. Common bile duct: Diameter: 6 mm Liver: No focal lesion identified. Increased parenchymal echogenicity. Portal vein is patent on color Doppler imaging with normal direction of blood flow towards the liver. Other: None. IMPRESSION: Cholelithiasis and positive Murphy's sign, findings suggest acute cholecystitis. Mildly dilated common bile duct. MRCP could be performed if there is concern for choledocholithiasis. Hepatic steatosis. Electronically Signed   By: Yetta Glassman M.D.   On: 06/23/2021 17:03    EKG: Independently reviewed.  Trigeminy's.  Assessment/Plan Active Problems:   Acute cholecystitis   Cholecystitis  (please populate well all problems here in Problem List. (For example, if patient is on BP meds at home and you resume or decide to hold them,  it is a problem that needs to be her. Same for CAD, COPD, HLD and so on)  Acute cholecystitis, probably early ascending cholangitis -Agreed with continue antibiotics of ceftriaxone and Flagyl -N.p.o. after midnight. -GI and general surgery consulted, MRCP pending.  Expect ERCP and cholecystectomy. -Pain medications.  Palpitations -Appears to have similar frequent PVCs/trigeminy's last year, was seen by cardiology, echocardiogram showed normal LVEF but grade 1 diastolic dysfunction indicating underlying HTN?Marland Kitchen  Patient however told me that he was never started on BP meds. -Check TSH, start low-dose of metoprolol 12.5 mg twice daily.  COPD -Stable, continue maintenance medications.  Cigarette smoke -Educated to quit -Nicotine patch for now.  DVT prophylaxis: Lovenox Code Status: Full code Family Communication: None at bedside Disposition Plan: Expect more than 2 midnight hospital stay for GI and surgical intervention. Consults called: General surgery Dr. Constance Haw, GI Dr. Laural Golden. Admission status:    Lequita Halt MD [facility] Pager [phone]  06/23/2021, 6:43 PM

## 2021-06-23 NOTE — ED Provider Notes (Signed)
[facility] EMERGENCY DEPARTMENT Provider Note   CSN: [id number] Arrival date & time: 06/23/21  1327     History Chief Complaint  Patient presents with   Chest Pain    Robert Yates is a 60 y.o. male.  HPI  60 year old male with a history of COPD, palpitations, presents to the emergency department today for evaluation of chest pain.  States he was sitting watching TV when he experienced chest pain to the center of his chest/epigastrium.  He called EMS but upon their arrival his symptoms had resolved so he did not come to the ED at that time.  Later on in the day the symptoms occurred again and at that time his pain was rated 10/10.  He felt like there was a weight on his chest.  He also had some associated nausea, lightheadedness and diaphoresis.  He has never had similar symptoms before.  He denies any episodes of vomiting.  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  He has never had a stress test or been diagnosed with CAD.  Past Medical History:  Diagnosis Date   COPD (chronic obstructive pulmonary disease) ([place])    Palpitations     Patient Active Problem List   Diagnosis Date Noted   Cigarette smoker 06/10/2021   Educated about COVID-19 virus infection 06/04/2020   Palpitations 06/04/2020   COPD  GOLD ? / active smoking  12/16/2019    Past Surgical History:  Procedure Laterality Date   TONSILLECTOMY         Family History  Adopted: Yes  Problem Relation Age of Onset   Cancer Brother     Social History   Tobacco Use   Smoking status: Some Days    Packs/day: 1.00    Types: Cigarettes    Start date: 11/07/1974   Smokeless tobacco: Never   Tobacco comments:    1 pack a day 06/10/21   Vaping Use   Vaping Use: Never used  Substance Use Topics   Alcohol use: Yes    Comment: occasional   Drug use: Not Currently    Comment: Nothing in 30 years    Home Medications Prior to Admission medications   Medication Sig Start Date End Date Taking? Authorizing  Provider  acetaminophen (TYLENOL) 500 MG tablet Take 1,000 mg by mouth every 6 (six) hours as needed.   Yes [provider]  fluticasone furoate-vilanterol (BREO ELLIPTA) 100-25 MCG/INH AEPB Inhale 1 puff into the lungs every morning. 06/10/21  Yes Tanda Rockers, MD  ibuprofen (ADVIL) 400 MG tablet Take 400 mg by mouth as needed.    Yes [provider]  Ibuprofen-diphenhydrAMINE HCl (ADVIL PM) 200-25 MG CAPS Take by mouth at bedtime.   Yes [provider]  vitamin B-12 (CYANOCOBALAMIN) 1000 MCG tablet Take 1,000 mcg by mouth daily.   Yes [provider]  fluticasone furoate-vilanterol (BREO ELLIPTA) 100-25 MCG/INH AEPB Inhale 1 puff into the lungs daily for 14 doses. Patient not taking: No sig reported 06/10/21 06/24/21  Tanda Rockers, MD  fluticasone furoate-vilanterol (BREO ELLIPTA) 100-25 MCG/INH AEPB Inhale 1 puff into the lungs daily for 14 doses. Patient not taking: No sig reported 06/10/21 06/24/21  Tanda Rockers, MD  Melatonin 10 MG CAPS Take by mouth as needed. Patient not taking: No sig reported    [provider]  VITAMIN D PO Take by mouth 3 (three) times a week. Patient not taking: No sig reported    [provider]  Allergies    Sulfa antibiotics  Review of Systems   Review of Systems  Constitutional:  Positive for diaphoresis. Negative for fever.  HENT:  Negative for ear pain and sore throat.   Eyes:  Negative for visual disturbance.  Respiratory:  Positive for shortness of breath. Negative for cough.   Cardiovascular:  Positive for chest pain. Negative for leg swelling.  Gastrointestinal:  Positive for nausea. Negative for abdominal pain and vomiting.  Genitourinary:  Negative for dysuria and hematuria.  Musculoskeletal:  Negative for back pain.  Skin:  Negative for rash.  Neurological:  Positive for light-headedness. Negative for headaches.  All other systems reviewed and are negative.  Physical Exam Updated  Vital Signs BP 140/90   Pulse 81   Temp 97.8 F (36.6 C) (Oral)   Resp 18   Ht '6\' 2"'$  (1.88 m)   Wt 78.9 kg   SpO2 96%   BMI 22.34 kg/m   Physical Exam Vitals and nursing note reviewed.  Constitutional:      Appearance: He is well-developed.  HENT:     Head: Normocephalic and atraumatic.  Eyes:     Conjunctiva/sclera: Conjunctivae normal.  Cardiovascular:     Rate and Rhythm: Normal rate and regular rhythm.     Pulses:          Dorsalis pedis pulses are 2+ on the right side and 2+ on the left side.     Heart sounds: Normal heart sounds. No murmur heard. Pulmonary:     Effort: Pulmonary effort is normal. No respiratory distress.     Breath sounds: Normal breath sounds. No decreased breath sounds, wheezing, rhonchi or rales.  Abdominal:     Palpations: Abdomen is soft.     Tenderness: There is abdominal tenderness in the right upper quadrant, epigastric area and left upper quadrant.  Musculoskeletal:     Cervical back: Neck supple.     Right lower leg: No tenderness. No edema.     Left lower leg: No tenderness. No edema.  Skin:    General: Skin is warm and dry.  Neurological:     Mental Status: He is alert.    ED Results / Procedures / Treatments   Labs (all labs ordered are listed, but only abnormal results are displayed) Labs Reviewed  BASIC METABOLIC PANEL - Abnormal; Notable for the following components:      Result Value   Glucose, Bld 108 (*)    All other components within normal limits  CBC - Abnormal; Notable for the following components:   RBC 6.05 (*)    Hemoglobin 18.1 (*)    HCT 56.1 (*)    All other components within normal limits  HEPATIC FUNCTION PANEL - Abnormal; Notable for the following components:   AST 447 (*)    ALT 355 (*)    Total Bilirubin 1.4 (*)    Bilirubin, Direct 0.3 (*)    Indirect Bilirubin 1.1 (*)    All other components within normal limits  LIPASE, BLOOD  COMPREHENSIVE METABOLIC PANEL  CBC WITH DIFFERENTIAL/PLATELET   TROPONIN I (HIGH SENSITIVITY)  TROPONIN I (HIGH SENSITIVITY)    EKG EKG Interpretation  Date/Time:  Wednesday June 23 2021 13:37:21 EDT Ventricular Rate:  93 PR Interval:  146 QRS Duration: 84 QT Interval:  342 QTC Calculation: 425 R Axis:   81 Text Interpretation: Sinus rhythm with frequent Premature ventricular complexes Cannot rule out Anterior infarct , age undetermined Abnormal ECG No old tracing to compare Confirmed by  Nanda Quinton [id number]) on 06/23/2021 1:41:45 PM  Radiology DG Chest 2 View  Result Date: 06/23/2021 CLINICAL DATA:  Chest pain. EXAM: CHEST - 2 VIEW COMPARISON:  November 24, 2006. FINDINGS: The heart size and mediastinal contours are within normal limits. Both lungs are clear. The visualized skeletal structures are unremarkable. IMPRESSION: No active cardiopulmonary disease. Electronically Signed   By: Marijo Conception M.D.   On: 06/23/2021 14:16   US Abdomen Limited RUQ (LIVER/GB)  Result Date: 06/23/2021 CLINICAL DATA:  Right upper quadrant pain EXAM: ULTRASOUND ABDOMEN LIMITED RIGHT UPPER QUADRANT COMPARISON:  None. FINDINGS: Gallbladder: Small stones and sludge seen in the gallbladder. No gallbladder wall thickening. Positive Murphy sign noted by sonographer. Common bile duct: Diameter: 6 mm Liver: No focal lesion identified. Increased parenchymal echogenicity. Portal vein is patent on color Doppler imaging with normal direction of blood flow towards the liver. Other: None. IMPRESSION: Cholelithiasis and positive Murphy's sign, findings suggest acute cholecystitis. Mildly dilated common bile duct. MRCP could be performed if there is concern for choledocholithiasis. Hepatic steatosis. Electronically Signed   By: Yetta Glassman M.D.   On: 06/23/2021 17:03    Procedures Procedures   Medications Ordered in ED Medications  sodium chloride 0.9 % bolus 1,000 mL (has no administration in time range)  metroNIDAZOLE (FLAGYL) IVPB 500 mg (has no administration in  time range)  ondansetron (ZOFRAN) injection 4 mg (has no administration in time range)  cefTRIAXone (ROCEPHIN) 2 g in sodium chloride 0.9 % 100 mL IVPB (has no administration in time range)  HYDROmorphone (DILAUDID) injection 0.5 mg (0.5 mg Intravenous Given 06/23/21 1733)  ondansetron (ZOFRAN) injection 4 mg (4 mg Intravenous Given 06/23/21 1731)  gadobutrol (GADAVIST) 1 MMOL/ML injection 7.5 mL (7.5 mLs Intravenous Contrast Given 06/23/21 1808)    ED Course  I have reviewed the triage vital signs and the nursing notes.  Pertinent labs & imaging results that were available during my care of the patient were reviewed by me and considered in my medical decision making (see chart for details).    MDM Rules/Calculators/A&P                          60 y/o M here with chest pain  Reviewed/interpreted labs CBC with hgb 18, otherwise reassuring BMP unremarkable Lipase is unremarkable LFTs are elevated, bilirubin elevated  Trop WNL, delta Trop neg  EKG - Sinus rhythm with frequent Premature ventricular complexes Cannot rule out Anterior infarct , age undetermined Abnormal ECG No old tracing to compare   CXR reviewed/interpreted - No active cardiopulmonary disease. RUQ [place] - Cholelithiasis and positive Murphy's sign, findings suggest acute cholecystitis. Mildly dilated common bile duct. MRCP could be performed if there is concern for choledocholithiasis. Hepatic steatosis.   5:11 PM CONSULT with Dr. Constance Haw with general surgery. She does recommend MRCP. She recommends abx, medical admission and NPO at midnight.   6:00 PM CONSULT with Dr. Roosevelt Locks who accepts patient for admission    6:09 PM CONSULT with Dr. Laural Golden who will consult on patient   Final Clinical Impression(s) / ED Diagnoses Final diagnoses:  RUQ pain  Cholecystitis    Rx / DC Orders ED Discharge Orders     None        Bishop Dublin 06/23/21 1810    Luna Fuse, MD 07/14/21 1701

## 2021-06-23 NOTE — ED Triage Notes (Signed)
Pt to er, pt states that this am he was sitting watching tv and started having chest pain, states that he called 911 and then felt better so they canceled them, states that shortly after the pain came back, states that he felt weak and diaphoretic.

## 2021-06-23 NOTE — ED Notes (Signed)
Patient transported to MRI 

## 2021-06-24 ENCOUNTER — Encounter (HOSPITAL_COMMUNITY): Payer: Self-pay | Admitting: Internal Medicine

## 2021-06-24 DIAGNOSIS — K8021 Calculus of gallbladder without cholecystitis with obstruction: Secondary | ICD-10-CM

## 2021-06-24 DIAGNOSIS — R1011 Right upper quadrant pain: Secondary | ICD-10-CM

## 2021-06-24 DIAGNOSIS — K8020 Calculus of gallbladder without cholecystitis without obstruction: Secondary | ICD-10-CM

## 2021-06-24 DIAGNOSIS — R7989 Other specified abnormal findings of blood chemistry: Secondary | ICD-10-CM

## 2021-06-24 LAB — HEMOGLOBIN AND HEMATOCRIT, BLOOD
HCT: 45.3 % (ref 39.0–52.0)
Hemoglobin: 14.8 g/dL (ref 13.0–17.0)

## 2021-06-24 LAB — CBC WITH DIFFERENTIAL/PLATELET
Abs Immature Granulocytes: 0.04 10*3/uL (ref 0.00–0.07)
Basophils Absolute: 0.1 10*3/uL (ref 0.0–0.1)
Basophils Relative: 1 %
Eosinophils Absolute: 0 10*3/uL (ref 0.0–0.5)
Eosinophils Relative: 1 %
HCT: 47.8 % (ref 39.0–52.0)
Hemoglobin: 15.4 g/dL (ref 13.0–17.0)
Immature Granulocytes: 1 %
Lymphocytes Relative: 13 %
Lymphs Abs: 0.8 10*3/uL (ref 0.7–4.0)
MCH: 29.7 pg (ref 26.0–34.0)
MCHC: 32.2 g/dL (ref 30.0–36.0)
MCV: 92.1 fL (ref 80.0–100.0)
Monocytes Absolute: 0.6 10*3/uL (ref 0.1–1.0)
Monocytes Relative: 10 %
Neutro Abs: 4.4 10*3/uL (ref 1.7–7.7)
Neutrophils Relative %: 74 %
Platelets: 227 10*3/uL (ref 150–400)
RBC: 5.19 MIL/uL (ref 4.22–5.81)
RDW: 13.8 % (ref 11.5–15.5)
WBC: 5.9 10*3/uL (ref 4.0–10.5)
nRBC: 0 % (ref 0.0–0.2)

## 2021-06-24 LAB — COMPREHENSIVE METABOLIC PANEL
ALT: 380 U/L — ABNORMAL HIGH (ref 0–44)
AST: 249 U/L — ABNORMAL HIGH (ref 15–41)
Albumin: 3.5 g/dL (ref 3.5–5.0)
Alkaline Phosphatase: 96 U/L (ref 38–126)
Anion gap: 4 — ABNORMAL LOW (ref 5–15)
BUN: 9 mg/dL (ref 8–23)
CO2: 27 mmol/L (ref 22–32)
Calcium: 8.3 mg/dL — ABNORMAL LOW (ref 8.9–10.3)
Chloride: 107 mmol/L (ref 98–111)
Creatinine, Ser: 0.76 mg/dL (ref 0.61–1.24)
GFR, Estimated: >60 mL/min (ref >60)
Glucose, Bld: 122 mg/dL — ABNORMAL HIGH (ref 70–99)
Potassium: 3.7 mmol/L (ref 3.5–5.1)
Sodium: 138 mmol/L (ref 135–145)
Total Bilirubin: 0.9 mg/dL (ref 0.3–1.2)
Total Protein: 5.9 g/dL — ABNORMAL LOW (ref 6.5–8.1)

## 2021-06-24 LAB — SARS CORONAVIRUS 2 (TAT 6-24 HRS): SARS Coronavirus 2: NEGATIVE

## 2021-06-24 MED ORDER — SODIUM CHLORIDE 0.9 % IV SOLN
25.0000 mg | Freq: Three times a day (TID) | INTRAVENOUS | Status: DC | PRN
  Administered 2021-06-24: 25 mg via INTRAVENOUS
  Filled 2021-06-24 (×2): qty 1

## 2021-06-24 MED ORDER — PROCHLORPERAZINE EDISYLATE 10 MG/2ML IJ SOLN
10.0000 mg | Freq: Four times a day (QID) | INTRAMUSCULAR | Status: DC | PRN
  Administered 2021-06-24: 10 mg via INTRAVENOUS
  Filled 2021-06-24: qty 2

## 2021-06-24 MED ORDER — OXYMETAZOLINE HCL 0.05 % NA SOLN
1.0000 | Freq: Two times a day (BID) | NASAL | Status: DC | PRN
  Administered 2021-06-24: 1 via NASAL
  Filled 2021-06-24: qty 30

## 2021-06-24 MED ORDER — SODIUM CHLORIDE 0.9 % IV SOLN: INTRAVENOUS | Status: AC

## 2021-06-24 NOTE — Consult Note (Signed)
Referring Provider: Barton Dubois, MD Primary Care Physician:  No primary care provider on file. Primary Gastroenterologist:  Garfield Cornea, MD   Reason for Consultation:  acute cholecystitis, probable early ascending cholangitis  HPI: Robert Yates is a 60 y.o. male with PMH of COPD, palpitations, cigarette smoker, presented to the ED for acute onset chest pain/epigastric pain.  Initially called EMS but upon their arrival his symptoms had resolved so he decided not to come to the ED at that time.  Later on he had recurrent symptoms, pain rated at 10 out of 10.  Symptoms also associated with nausea, lightheadedness, diaphoresis.  EMS activated again.  Patient states his symptoms were acute onset and lasted just a few minutes. But when recurred yesterday they have persisted. Now with increased nausea and vomiting, since being in the ED. Thinks it is the medication. Still with epigastric pain. Pain medication helping. Recalls mild GI symptoms, with short lived abdominal pain or hiccups in the past. No heartburn. BM ok. No melena, brbpr. Takes advil pm every night. No other NSAIDS. Seldom etoh consumption.  In the ED: BUN 10, creatinine 0.76, white blood cell count 7900, hemoglobin 18.1, platelets 246,000, lipase 39, total bilirubin 1.4, indirect bilirubin 1.1, alkaline phosphatase 97, AST 447, ALT 355.  LFTs normal 1 year prior.  High-sensitivity troponin I 3.  Repeat LFTs this morning with total bilirubin 0.9, alkaline phosphatase 96, AST 249, ALT 380.  Imaging:  -Chest x-ray with no active cardiopulmonary disease. -Right upper quadrant ultrasound cholelithiasis, no gallbladder wall thickening, positive Murphy sign, CBD 6 mm mildly dilated.  Hepatic steatosis. -MRCP, markedly limited by respiratory motion.  Numerous gallstones in the dependent gallbladder.  No pericholecystic stranding or wall thickening.  Biliary anatomy difficult to assess though suggestion of low insertion of the posterior  division right hepatic duct up in the common hepatic duct just above the cystic duct confluence (if there is plan for cholecystectomy at some point, this variant can be clinically significant based on position of this duct relative to the cystic duct).  No filling defect in the common bile duct but images are limited due to respiratory motion.  Colonoscopy 1994: Dr. Lucio Edward -erythema/friability of rectosigmoid colon, bx unremarkable -hyperplastic rectal polyps    Prior to Admission medications   Medication Sig Start Date End Date Taking? Authorizing Provider  acetaminophen (TYLENOL) 500 MG tablet Take 1,000 mg by mouth every 6 (six) hours as needed.   Yes [provider]  fluticasone furoate-vilanterol (BREO ELLIPTA) 100-25 MCG/INH AEPB Inhale 1 puff into the lungs every morning. 06/10/21  Yes Tanda Rockers, MD  ibuprofen (ADVIL) 400 MG tablet Take 400 mg by mouth as needed.    Yes [provider]  Ibuprofen-diphenhydrAMINE HCl (ADVIL PM) 200-25 MG CAPS Take by mouth at bedtime.   Yes [provider]  vitamin B-12 (CYANOCOBALAMIN) 1000 MCG tablet Take 1,000 mcg by mouth daily.   Yes [provider]                                    Current Facility-Administered Medications  Medication Dose Route Frequency Provider Last Rate Last Admin   acetaminophen (TYLENOL) tablet 1,000 mg  1,000 mg Oral Q6H PRN Lequita Halt, MD       bisacodyl (DULCOLAX) EC tablet 5 mg  5 mg Oral Daily PRN Lequita Halt, MD       cefTRIAXone (  ROCEPHIN) 2 g in sodium chloride 0.9 % 100 mL IVPB  2 g Intravenous Q24H Long, Wonda Olds, MD   Stopped at 06/23/21 1906   enoxaparin (LOVENOX) injection 40 mg  40 mg Subcutaneous Q24H Wynetta Fines T, MD   40 mg at 06/23/21 2027   fluticasone furoate-vilanterol (BREO ELLIPTA) 100-25 MCG/INH 1 puff  1 puff Inhalation q morning Wynetta Fines T, MD   1 puff at 06/24/21 0812   HYDROmorphone (DILAUDID) injection 0.5 mg  0.5 mg Intravenous Q4H PRN  Wynetta Fines T, MD   0.5 mg at 06/24/21 0946   ketorolac (TORADOL) 15 MG/ML injection 15 mg  15 mg Intravenous Q6H PRN Wynetta Fines T, MD   15 mg at 06/23/21 2050   metoprolol tartrate (LOPRESSOR) tablet 12.5 mg  12.5 mg Oral BID Wynetta Fines T, MD   12.5 mg at 06/24/21 0942   metroNIDAZOLE (FLAGYL) IVPB 500 mg  500 mg Intravenous Q8H Wynetta Fines T, MD 100 mL/hr at 06/24/21 0947 500 mg at 06/24/21 0947   nicotine (NICODERM CQ - dosed in mg/24 hours) patch 21 mg  21 mg Transdermal Daily Wynetta Fines T, MD   21 mg at 06/23/21 1911   ondansetron (ZOFRAN) tablet 4 mg  4 mg Oral Q6H PRN Wynetta Fines T, MD       Or   ondansetron [facility]) injection 4 mg  4 mg Intravenous Q6H PRN Wynetta Fines T, MD   4 mg at 06/24/21 0946   prochlorperazine (COMPAZINE) injection 10 mg  10 mg Intravenous Q6H PRN Barton Dubois, MD   10 mg at 06/24/21 1110   promethazine (PHENERGAN) 25 mg in sodium chloride 0.9 % 50 mL IVPB  25 mg Intravenous Q8H PRN Barton Dubois, MD       vitamin B-12 (CYANOCOBALAMIN) tablet 1,000 mcg  1,000 mcg Oral Daily Wynetta Fines T, MD   1,000 mcg at 06/24/21 0943   zolpidem (AMBIEN) tablet 5 mg  5 mg Oral QHS Wynetta Fines T, MD   5 mg at 06/23/21 2236   Current Outpatient Medications  Medication Sig Dispense Refill   acetaminophen (TYLENOL) 500 MG tablet Take 1,000 mg by mouth every 6 (six) hours as needed.     fluticasone furoate-vilanterol (BREO ELLIPTA) 100-25 MCG/INH AEPB Inhale 1 puff into the lungs every morning. 28 each 11   ibuprofen (ADVIL) 400 MG tablet Take 400 mg by mouth as needed.      Ibuprofen-diphenhydrAMINE HCl (ADVIL PM) 200-25 MG CAPS Take by mouth at bedtime.     vitamin B-12 (CYANOCOBALAMIN) 1000 MCG tablet Take 1,000 mcg by mouth daily.     fluticasone furoate-vilanterol (BREO ELLIPTA) 100-25 MCG/INH AEPB Inhale 1 puff into the lungs daily for 14 doses. (Patient not taking: No sig reported) 14 each 0   fluticasone furoate-vilanterol (BREO ELLIPTA) 100-25 MCG/INH AEPB Inhale 1 puff  into the lungs daily for 14 doses. (Patient not taking: No sig reported) 14 each 0   Melatonin 10 MG CAPS Take by mouth as needed. (Patient not taking: No sig reported)     VITAMIN D PO Take by mouth 3 (three) times a week. (Patient not taking: No sig reported)      Allergies as of 06/23/2021 - Review Complete 06/23/2021  Allergen Reaction Noted   Sulfa antibiotics Rash 11/08/2019    Past Medical History:  Diagnosis Date   COPD (chronic obstructive pulmonary disease) ([place])    Palpitations     Past Surgical History:  Procedure Laterality Date  skin grafts     TONSILLECTOMY      Family History  Adopted: Yes  Problem Relation Age of Onset   Colon cancer Brother 4    Social History   Socioeconomic History   Marital status: Divorced    Spouse name: [name]   Number of children: [name]   Years of education: [name]   Highest education level: [name]  Occupational History   Occupation: farmer  Tobacco Use   Smoking status: Some Days    Packs/day: 1.00    Types: Cigarettes    Start date: 11/07/1974   Smokeless tobacco: Never   Tobacco comments:    1 pack a day 06/10/21   Vaping Use   Vaping Use: Never used  Substance and Sexual Activity   Alcohol use: Yes    Comment: occasional   Drug use: Not Currently    Comment: Nothing in 30 years   Sexual activity: [name]  Other Topics Concern   [name]  Social History Narrative   Divorced for 20 years,married for 13 years previously.Lives with girlfriend of 2 years.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   Social Determinants of Health   Financial Resource Strain: [name]  Food Insecurity: [name]  Transportation Needs: [name]  Physical Activity: [name]  Stress: [name]  Social Connections: [name]  Intimate Partner Violence: [name]     ROS:  General: Negative for anorexia, weight loss, fever, chills, fatigue, weakness. Eyes: Negative for vision changes.  ENT:  Negative for hoarseness, difficulty swallowing , nasal congestion. CV: Negative for chest pain, angina, palpitations, dyspnea on exertion, peripheral edema. See hpi Respiratory: Negative for dyspnea at rest, dyspnea on exertion, cough, sputum, wheezing.  GI: See history of present illness. GU:  Negative for dysuria, hematuria, urinary incontinence, urinary frequency, nocturnal urination.  MS: Negative for joint pain, low back pain.  Derm: Negative for rash or itching.  Neuro: Negative for weakness, abnormal sensation, seizure, frequent headaches, memory loss, confusion.  Psych: Negative for anxiety, depression, suicidal ideation, hallucinations.  Endo: Negative for unusual weight change.  Heme: Negative for bruising or bleeding. Allergy: Negative for rash or hives.       Physical Examination: Vital signs in last 24 hours: Temp:  [97.8 F (36.6 C)] 97.8 F (36.6 C) (09/14 1332) Pulse Rate:  [25-114] 80 (09/15 1102) Resp:  [11-35] 18 (09/15 1102) BP: (85-167)/(32-113) 127/68 (09/15 1102) SpO2:  [93 %-100 %] 95 % (09/15 1102) Weight:  [78.9 kg] 78.9 kg (09/14 1332)    General: appears uncomfortable. Wife at bedside Head: Normocephalic, atraumatic.   Eyes: Conjunctiva pink, no icterus. Mouth: Oropharyngeal mucosa moist and pink , no lesions erythema or exudate. Neck: Supple without thyromegaly, masses, or lymphadenopathy.  Lungs: Clear to auscultation bilaterally.  Heart: Regular rate and rhythm, no murmurs rubs or gallops.  Abdomen: Bowel sounds are normal,   nondistended, no hepatosplenomegaly or masses, no abdominal bruits or    hernia , no rebound or guarding.  Mild epig tenderness Rectal: not performed Extremities: No lower extremity edema, clubbing, deformity.  Neuro: Alert and oriented x 4 , grossly normal neurologically.  Skin: Warm and dry, no rash or jaundice.   Psych: Alert and cooperative, normal mood and affect.        Intake/Output from previous day: 09/14 0701 -  09/15 0700 In: 1815.7 [I.V.:465.7; IV Piggyback:1350] Out: -  Intake/Output this  shift: No intake/output data recorded.  Lab Results: CBC Recent Labs    06/23/21 1355 06/24/21 0421  WBC 7.9 5.9  HGB 18.1* 15.4  HCT 56.1* 47.8  MCV 92.7 92.1  PLT 246 227   BMET Recent Labs    06/23/21 1355 06/24/21 0421  NA 138 138  K 4.3 3.7  CL 104 107  CO2 25 27  GLUCOSE 108* 122*  BUN 10 9  CREATININE 0.76 0.76  CALCIUM 9.6 8.3*   LFT Recent Labs    06/23/21 1355 06/24/21 0421  BILITOT 1.4* 0.9  BILIDIR 0.3*  --   IBILI 1.1*  --   ALKPHOS 97 96  AST 447* 249*  ALT 355* 380*  PROT 7.6 5.9*  ALBUMIN 4.5 3.5    Lipase Recent Labs    06/23/21 1355  LIPASE 39    PT/INR No results for input(s): LABPROT, INR in the last 72 hours.    Imaging Studies: DG Chest 2 View  Result Date: 06/23/2021 CLINICAL DATA:  Chest pain. EXAM: CHEST - 2 VIEW COMPARISON:  November 24, 2006. FINDINGS: The heart size and mediastinal contours are within normal limits. Both lungs are clear. The visualized skeletal structures are unremarkable. IMPRESSION: No active cardiopulmonary disease. Electronically Signed   By: Marijo Conception M.D.   On: 06/23/2021 14:16   MR 3D Recon At Scanner  Addendum Date: 06/23/2021   ADDENDUM REPORT: 06/23/2021 18:50 ADDENDUM: Again no MRI findings of acute cholecystitis. If there are concerning physical exam findings could consider HIDA scan for further assessment as warranted. Variant biliary ductal anatomy was discussed with the ordering provider as outlined below. These results were called by telephone at the time of interpretation on 06/23/2021 at 6:50 pm to provider [facility] , who verbally acknowledged these results. Electronically Signed   By: Zetta Bills M.D.   On: 06/23/2021 18:50   Result Date: 06/23/2021 CLINICAL DATA:  RIGHT upper quadrant abdominal pain in a 60 year old male. Ultrasound equivocal for CBD stone. EXAM: MRI ABDOMEN WITHOUT AND WITH  CONTRAST (INCLUDING MRCP) TECHNIQUE: Multiplanar multisequence MR imaging of the abdomen was performed both before and after the administration of intravenous contrast. Heavily T2-weighted images of the biliary and pancreatic ducts were obtained, and three-dimensional MRCP images were rendered by post processing. CONTRAST:  7.76m GADAVIST GADOBUTROL 1 MMOL/ML IV SOLN COMPARISON:  Ultrasound evaluation of the same date. FINDINGS: Lower chest: Lung bases are grossly clear. Limited assessment on MRI. Hepatobiliary: MRCP sequences are markedly limited by respiratory motion. There are numerous gallstones in the dependent gallbladder in the fundus and midportion of the gallbladder. No pericholecystic stranding, wall thickening or hyperenhancement of the gallbladder wall. Biliary anatomy is difficult to assess though there is suggestion of low insertion of a posterior division RIGHT hepatic duct upon the common hepatic duct just above the cystic duct confluence. No filling defect in the common bile duct to the extent that it can be evaluated on MRCP sequences and axial MR images which are limited by respiratory motion. No focal, suspicious hepatic lesion. Images compromised by motion as outlined above. Portal vein is patent. Hepatic veins are patent. Pancreas: Normal intrinsic T1 signal. No ductal dilation or sign of inflammation. No focal lesion. Spleen:  Normal spleen. Adrenals/Urinary Tract: Adrenal glands are normal. No focal, suspicious renal lesion. No hydronephrosis. No perinephric stranding. Stomach/Bowel: No acute gastrointestinal process to the extent evaluated on abdominal MRI, not performed for bowel evaluation. Vascular/Lymphatic: No pathologically enlarged lymph nodes identified. No abdominal aortic  aneurysm demonstrated. Atherosclerotic irregularity of the abdominal aorta. Other:  No ascites. Musculoskeletal: No suspicious bone lesions identified. IMPRESSION: Cholelithiasis without MR evidence of acute  cholecystitis. Variant biliary anatomy which may have clinical significance, posterior division RIGHT hepatic duct with low insertion hepatic duct on the common above the cystic duct. If there is plan for cholecystectomy at some point in this patient, this variant can be clinically significant based on position of this duct relative to the cystic duct. No filling defect in the common bile duct. Note that images are limited due to respiratory motion on the current study as outlined above. These results will be called to the ordering clinician or representative by the Radiologist Assistant, and communication documented in the PACS or [employer]. Electronically Signed: By: Zetta Bills M.D. On: 06/23/2021 18:42   MR ABDOMEN MRCP W WO CONTAST  Addendum Date: 06/23/2021   ADDENDUM REPORT: 06/23/2021 18:50 ADDENDUM: Again no MRI findings of acute cholecystitis. If there are concerning physical exam findings could consider HIDA scan for further assessment as warranted. Variant biliary ductal anatomy was discussed with the ordering provider as outlined below. These results were called by telephone at the time of interpretation on 06/23/2021 at 6:50 pm to provider [facility] , who verbally acknowledged these results. Electronically Signed   By: Zetta Bills M.D.   On: 06/23/2021 18:50   Result Date: 06/23/2021 CLINICAL DATA:  RIGHT upper quadrant abdominal pain in a 60 year old male. Ultrasound equivocal for CBD stone. EXAM: MRI ABDOMEN WITHOUT AND WITH CONTRAST (INCLUDING MRCP) TECHNIQUE: Multiplanar multisequence MR imaging of the abdomen was performed both before and after the administration of intravenous contrast. Heavily T2-weighted images of the biliary and pancreatic ducts were obtained, and three-dimensional MRCP images were rendered by post processing. CONTRAST:  7.10m GADAVIST GADOBUTROL 1 MMOL/ML IV SOLN COMPARISON:  Ultrasound evaluation of the same date. FINDINGS: Lower chest: Lung bases are  grossly clear. Limited assessment on MRI. Hepatobiliary: MRCP sequences are markedly limited by respiratory motion. There are numerous gallstones in the dependent gallbladder in the fundus and midportion of the gallbladder. No pericholecystic stranding, wall thickening or hyperenhancement of the gallbladder wall. Biliary anatomy is difficult to assess though there is suggestion of low insertion of a posterior division RIGHT hepatic duct upon the common hepatic duct just above the cystic duct confluence. No filling defect in the common bile duct to the extent that it can be evaluated on MRCP sequences and axial MR images which are limited by respiratory motion. No focal, suspicious hepatic lesion. Images compromised by motion as outlined above. Portal vein is patent. Hepatic veins are patent. Pancreas: Normal intrinsic T1 signal. No ductal dilation or sign of inflammation. No focal lesion. Spleen:  Normal spleen. Adrenals/Urinary Tract: Adrenal glands are normal. No focal, suspicious renal lesion. No hydronephrosis. No perinephric stranding. Stomach/Bowel: No acute gastrointestinal process to the extent evaluated on abdominal MRI, not performed for bowel evaluation. Vascular/Lymphatic: No pathologically enlarged lymph nodes identified. No abdominal aortic aneurysm demonstrated. Atherosclerotic irregularity of the abdominal aorta. Other:  No ascites. Musculoskeletal: No suspicious bone lesions identified. IMPRESSION: Cholelithiasis without MR evidence of acute cholecystitis. Variant biliary anatomy which may have clinical significance, posterior division RIGHT hepatic duct with low insertion hepatic duct on the common above the cystic duct. If there is plan for cholecystectomy at some point in this patient, this variant can be clinically significant based on position of this duct relative to the cystic duct. No filling defect in the common bile duct.  Note that images are limited due to respiratory motion on the  current study as outlined above. These results will be called to the ordering clinician or representative by the Radiologist Assistant, and communication documented in the PACS or [employer]. Electronically Signed: By: Zetta Bills M.D. On: 06/23/2021 18:42   US Abdomen Limited RUQ (LIVER/GB)  Result Date: 06/23/2021 CLINICAL DATA:  Right upper quadrant pain EXAM: ULTRASOUND ABDOMEN LIMITED RIGHT UPPER QUADRANT COMPARISON:  None. FINDINGS: Gallbladder: Small stones and sludge seen in the gallbladder. No gallbladder wall thickening. Positive Murphy sign noted by sonographer. Common bile duct: Diameter: 6 mm Liver: No focal lesion identified. Increased parenchymal echogenicity. Portal vein is patent on color Doppler imaging with normal direction of blood flow towards the liver. Other: None. IMPRESSION: Cholelithiasis and positive Murphy's sign, findings suggest acute cholecystitis. Mildly dilated common bile duct. MRCP could be performed if there is concern for choledocholithiasis. Hepatic steatosis. Electronically Signed   By: Yetta Glassman M.D.   On: 06/23/2021 17:03  [4 week]   Impression:  60 y/o male with acute onset chest/epigastric pain associated with N/V presenting to the ED. Noted to have new elevation of LFTs. Initial imaging via u/s with mild CBD dilation, cholelithiasis and positive Murphy's sign suggesting acute cholecystitis. No gb wall thickening noted. MRCP with no evidence of acute cholecystitis. Gallstones present. Variant biliary anatomy, posterior division RIGHT hepatic duct with low insertion hepatic duct on the common above the cystic duct. May be clinically significant if cholecystectomy planned. No filling defect in CBD but images limited due to respiratory motion artifact.   He has persistent pain today. LFTs slightly improved. He may have passed a stone or have persistent microlithiasis involving the CBD not well seen due to motion artifact. No clear indication at this  time for ERCP but may need intraoperative cholangiogram.   Plan: Continue antibiotic coverage.  Supportive measures. Consider intraoperative cholangiogram at time of cholecystectomy.  Trend LFTs.  Colonoscopy at a later date, overdue with FH colon cancer brother at age 78.   We would like to thank you for the opportunity to participate in the care of [name].  Laureen Ochs. Bernarda Caffey [facility] [phone] 9/15/202212:26 PM    LOS: 1 day

## 2021-06-24 NOTE — Progress Notes (Signed)
PROGRESS NOTE    Robert Yates  [id number] DOB: [birth year] DOA: 06/23/2021 PCP: No primary care provider on file.    Chief Complaint  Patient presents with   Chest Pain    Brief admission Narrative:  As per H&P written by Dr.Zhang on 06/23/21 Robert Yates is a 60 y.o. male with medical history significant of COPD, chronic palpitations, cigarette smoker, presented with new onset of epigastric pain and chest pain.  Symptoms started this morning after eating breakfast, patient started to experience severe cramping like epigastric pain radiated to the chest and right shoulder, worsening with deep breath, has episode of chills but no fever, feeling nauseous but no vomiting.  He called ambulance but when ambulance came he symptoms relieved.  But later the pain came back again and became persistent on the right upper quadrant, worsening with deep breath and movement and he called EMS again.   ED Course: Troponin negative x2, EKG showed no acute ST changes.  Blood work showed WBC 6.0, elevated LFTs AST 400 and ALT 350, CT abdomen showed signs of acute cholecystitis and mild dilation of CBD.  Assessment & Plan: 1-Acute cholelithiasis and biliary colic. -Still having intermittent nausea/vomiting right upper quadrant pain. -Patient is afebrile, normal WBCs and demonstrating no signs of cholecystitis on MRCP. -Case has been discussed with general surgery who have recommended continue treatment with fluid resuscitation and slowly advance diet will provide analgesics and antiemetics. -If patient is able to tolerate diet can discharge home with outpatient follow-up for elective cholecystectomy. -LFTs trending down.  2-transaminitis -As mentioned above LFTs are trending down -Appears to be associated with the passage of a gallstones -MRCP ruling out obstruction or significant dilatation of the bile duct. -No indication for ERCP at this time.  3-tobacco abuse -Cessation counseling  provided -Continue the use of nicotine patch.  4-COPD -No wheezing, no shortness of breath -Stable overall. -Continue bronchodilators.  5-PVC's/trigeminy  -Good response to the use of Lopressor -TSH within normal limit. -Continue to monitor on telemetry.  6-grade 1 diastolic dysfunction -Chronic and compensated -Continue to follow daily weights advance diet is resumed will encourage low-sodium diet.   DVT prophylaxis: Lovenox Code Status: Full code Family Communication: No family at bedside. Disposition:   Status is: Inpatient  Remains inpatient appropriate because:IV treatments appropriate due to intensity of illness or inability to take PO  Dispo: The patient is from: Home              Anticipated d/c is to: Home              Patient currently is not medically stable to d/c.   Difficult to place patient No     Consultants:  General surgery  Procedures:  See below for x-ray reports.  Antimicrobials: None   Subjective: No chest pain, no shortness of breath and is currently afebrile.  Continue expressing midepigastric region/right upper quadrant pain requiring IV pain medication and is still experiencing intermittent episode of nausea/vomiting.  Objective: Vitals:   06/24/21 1307 06/24/21 1332 06/24/21 1400 06/24/21 1455  BP:  121/73 (!) 117/59 115/69  Pulse: 64 65 68 81  Resp: (!) '23 20 20 18  '$ Temp:   98.3 F (36.8 C) 98.2 F (36.8 C)  TempSrc:    Oral  SpO2: 94% 94% 96% 98%  Weight:      Height:        Intake/Output Summary (Last 24 hours) at 06/24/2021 1817 Last data filed at 06/24/2021 1549 Gross per  24 hour  Intake 1865.71 ml  Output --  Net 1865.71 ml   Filed Weights   06/23/21 1332  Weight: 78.9 kg    Examination:  General exam: Reporting intermittent episode of nausea; 2 overnight episodes of emesis.  Right upper quadrant discomfort described.  Would like to have something to drink.  Remains afebrile.  No chest pain and no shortness of  breath. Respiratory system: Clear to auscultation. Respiratory effort normal.  No requiring oxygen supplementation.  No using accessory muscles. Cardiovascular system: S1 & S2 heard, RRR. No JVD, murmurs, rubs, gallops or clicks. No pedal edema. Gastrointestinal system: Abdomen is nondistended, soft and mild to moderately tender to palpation in his right upper quadrant/midepigastric region.  No guarding.  Positive bowel sounds.   Central nervous system: Alert and oriented. No focal neurological deficits. Extremities: No cyanosis or clubbing. Skin: No rashes, no petechiae. Psychiatry: Judgement and insight appear normal. Mood & affect appropriate.     Data Reviewed: I have personally reviewed following labs and imaging studies  CBC: Recent Labs  Lab 06/23/21 1355 06/24/21 0421  WBC 7.9 5.9  NEUTROABS  --  4.4  HGB 18.1* 15.4  HCT 56.1* 47.8  MCV 92.7 92.1  PLT 246 [id number]    Basic Metabolic Panel: Recent Labs  Lab 06/23/21 1355 06/23/21 1600 06/24/21 0421  NA 138  --  138  K 4.3  --  3.7  CL 104  --  107  CO2 25  --  27  GLUCOSE 108*  --  122*  BUN 10  --  9  CREATININE 0.76  --  0.76  CALCIUM 9.6  --  8.3*  MG  --  2.3  --     GFR: Estimated Creatinine Clearance: 106.8 mL/min (by C-G formula based on SCr of 0.76 mg/dL).  Liver Function Tests: Recent Labs  Lab 06/23/21 1355 06/24/21 0421  AST 447* 249*  ALT 355* 380*  ALKPHOS 97 96  BILITOT 1.4* 0.9  PROT 7.6 5.9*  ALBUMIN 4.5 3.5    CBG: No results for input(s): GLUCAP in the last 168 hours.   Recent Results (from the past 240 hour(s))  SARS CORONAVIRUS 2 (TAT 6-24 HRS) Nasopharyngeal Nasopharyngeal Swab     Status: None   Collection Time: 06/23/21  8:03 PM   Specimen: Nasopharyngeal Swab  Result Value Ref Range Status   SARS Coronavirus 2 NEGATIVE NEGATIVE Final    Comment: (NOTE) SARS-CoV-2 target nucleic acids are NOT DETECTED.  The SARS-CoV-2 RNA is generally detectable in upper and  lower respiratory specimens during the acute phase of infection. Negative results do not preclude SARS-CoV-2 infection, do not rule out co-infections with other pathogens, and should not be used as the sole basis for treatment or other patient management decisions. Negative results must be combined with clinical observations, patient history, and epidemiological information. The expected result is Negative.  Fact Sheet for Patients: SugarRoll.be  Fact Sheet for Healthcare Providers: https://www.woods-mathews.com/  This test is not yet approved or cleared by the [place] FDA and  has been authorized for detection and/or diagnosis of SARS-CoV-2 by FDA under an Emergency Use Authorization (EUA). This EUA will remain  in effect (meaning this test can be used) for the duration of the COVID-19 declaration under Se ction 564(b)(1) of the Act, 21 U.S.C. section 360bbb-3(b)(1), unless the authorization is terminated or revoked sooner.  Performed at [facility], [place] [address] [place], [place] [address]      Radiology  Studies: DG Chest 2 View  Result Date: 06/23/2021 CLINICAL DATA:  Chest pain. EXAM: CHEST - 2 VIEW COMPARISON:  November 24, 2006. FINDINGS: The heart size and mediastinal contours are within normal limits. Both lungs are clear. The visualized skeletal structures are unremarkable. IMPRESSION: No active cardiopulmonary disease. Electronically Signed   By: Marijo Conception M.D.   On: 06/23/2021 14:16   MR 3D Recon At Scanner  Addendum Date: 06/23/2021   ADDENDUM REPORT: 06/23/2021 18:50 ADDENDUM: Again no MRI findings of acute cholecystitis. If there are concerning physical exam findings could consider HIDA scan for further assessment as warranted. Variant biliary ductal anatomy was discussed with the ordering provider as outlined below. These results were called by telephone at the time of interpretation on 06/23/2021 at 6:50 pm  to provider [facility] , who verbally acknowledged these results. Electronically Signed   By: Zetta Bills M.D.   On: 06/23/2021 18:50   Result Date: 06/23/2021 CLINICAL DATA:  RIGHT upper quadrant abdominal pain in a 60 year old male. Ultrasound equivocal for CBD stone. EXAM: MRI ABDOMEN WITHOUT AND WITH CONTRAST (INCLUDING MRCP) TECHNIQUE: Multiplanar multisequence MR imaging of the abdomen was performed both before and after the administration of intravenous contrast. Heavily T2-weighted images of the biliary and pancreatic ducts were obtained, and three-dimensional MRCP images were rendered by post processing. CONTRAST:  7.35m GADAVIST GADOBUTROL 1 MMOL/ML IV SOLN COMPARISON:  Ultrasound evaluation of the same date. FINDINGS: Lower chest: Lung bases are grossly clear. Limited assessment on MRI. Hepatobiliary: MRCP sequences are markedly limited by respiratory motion. There are numerous gallstones in the dependent gallbladder in the fundus and midportion of the gallbladder. No pericholecystic stranding, wall thickening or hyperenhancement of the gallbladder wall. Biliary anatomy is difficult to assess though there is suggestion of low insertion of a posterior division RIGHT hepatic duct upon the common hepatic duct just above the cystic duct confluence. No filling defect in the common bile duct to the extent that it can be evaluated on MRCP sequences and axial MR images which are limited by respiratory motion. No focal, suspicious hepatic lesion. Images compromised by motion as outlined above. Portal vein is patent. Hepatic veins are patent. Pancreas: Normal intrinsic T1 signal. No ductal dilation or sign of inflammation. No focal lesion. Spleen:  Normal spleen. Adrenals/Urinary Tract: Adrenal glands are normal. No focal, suspicious renal lesion. No hydronephrosis. No perinephric stranding. Stomach/Bowel: No acute gastrointestinal process to the extent evaluated on abdominal MRI, not performed for bowel  evaluation. Vascular/Lymphatic: No pathologically enlarged lymph nodes identified. No abdominal aortic aneurysm demonstrated. Atherosclerotic irregularity of the abdominal aorta. Other:  No ascites. Musculoskeletal: No suspicious bone lesions identified. IMPRESSION: Cholelithiasis without MR evidence of acute cholecystitis. Variant biliary anatomy which may have clinical significance, posterior division RIGHT hepatic duct with low insertion hepatic duct on the common above the cystic duct. If there is plan for cholecystectomy at some point in this patient, this variant can be clinically significant based on position of this duct relative to the cystic duct. No filling defect in the common bile duct. Note that images are limited due to respiratory motion on the current study as outlined above. These results will be called to the ordering clinician or representative by the Radiologist Assistant, and communication documented in the PACS or [employer] Electronically Signed: By: GZetta BillsM.D. On: 06/23/2021 18:42   MR ABDOMEN MRCP W WO CONTAST  Addendum Date: 06/23/2021   ADDENDUM REPORT: 06/23/2021 18:50 ADDENDUM: Again no MRI findings of  acute cholecystitis. If there are concerning physical exam findings could consider HIDA scan for further assessment as warranted. Variant biliary ductal anatomy was discussed with the ordering provider as outlined below. These results were called by telephone at the time of interpretation on 06/23/2021 at 6:50 pm to provider [facility] , who verbally acknowledged these results. Electronically Signed   By: Zetta Bills M.D.   On: 06/23/2021 18:50   Result Date: 06/23/2021 CLINICAL DATA:  RIGHT upper quadrant abdominal pain in a 60 year old male. Ultrasound equivocal for CBD stone. EXAM: MRI ABDOMEN WITHOUT AND WITH CONTRAST (INCLUDING MRCP) TECHNIQUE: Multiplanar multisequence MR imaging of the abdomen was performed both before and after the administration of  intravenous contrast. Heavily T2-weighted images of the biliary and pancreatic ducts were obtained, and three-dimensional MRCP images were rendered by post processing. CONTRAST:  7.70m GADAVIST GADOBUTROL 1 MMOL/ML IV SOLN COMPARISON:  Ultrasound evaluation of the same date. FINDINGS: Lower chest: Lung bases are grossly clear. Limited assessment on MRI. Hepatobiliary: MRCP sequences are markedly limited by respiratory motion. There are numerous gallstones in the dependent gallbladder in the fundus and midportion of the gallbladder. No pericholecystic stranding, wall thickening or hyperenhancement of the gallbladder wall. Biliary anatomy is difficult to assess though there is suggestion of low insertion of a posterior division RIGHT hepatic duct upon the common hepatic duct just above the cystic duct confluence. No filling defect in the common bile duct to the extent that it can be evaluated on MRCP sequences and axial MR images which are limited by respiratory motion. No focal, suspicious hepatic lesion. Images compromised by motion as outlined above. Portal vein is patent. Hepatic veins are patent. Pancreas: Normal intrinsic T1 signal. No ductal dilation or sign of inflammation. No focal lesion. Spleen:  Normal spleen. Adrenals/Urinary Tract: Adrenal glands are normal. No focal, suspicious renal lesion. No hydronephrosis. No perinephric stranding. Stomach/Bowel: No acute gastrointestinal process to the extent evaluated on abdominal MRI, not performed for bowel evaluation. Vascular/Lymphatic: No pathologically enlarged lymph nodes identified. No abdominal aortic aneurysm demonstrated. Atherosclerotic irregularity of the abdominal aorta. Other:  No ascites. Musculoskeletal: No suspicious bone lesions identified. IMPRESSION: Cholelithiasis without MR evidence of acute cholecystitis. Variant biliary anatomy which may have clinical significance, posterior division RIGHT hepatic duct with low insertion hepatic duct on  the common above the cystic duct. If there is plan for cholecystectomy at some point in this patient, this variant can be clinically significant based on position of this duct relative to the cystic duct. No filling defect in the common bile duct. Note that images are limited due to respiratory motion on the current study as outlined above. These results will be called to the ordering clinician or representative by the Radiologist Assistant, and communication documented in the PACS or [employer] Electronically Signed: By: GZetta BillsM.D. On: 06/23/2021 18:42   [place]domen Limited RUQ (LIVER/GB)  Result Date: 06/23/2021 CLINICAL DATA:  Right upper quadrant pain EXAM: ULTRASOUND ABDOMEN LIMITED RIGHT UPPER QUADRANT COMPARISON:  None. FINDINGS: Gallbladder: Small stones and sludge seen in the gallbladder. No gallbladder wall thickening. Positive Murphy sign noted by sonographer. Common bile duct: Diameter: 6 mm Liver: No focal lesion identified. Increased parenchymal echogenicity. Portal vein is patent on color Doppler imaging with normal direction of blood flow towards the liver. Other: None. IMPRESSION: Cholelithiasis and positive Murphy's sign, findings suggest acute cholecystitis. Mildly dilated common bile duct. MRCP could be performed if there is concern for choledocholithiasis. Hepatic steatosis. Electronically Signed  By: Yetta Glassman M.D.   On: 06/23/2021 17:03     Scheduled Meds:  enoxaparin (LOVENOX) injection  40 mg Subcutaneous Q24H   fluticasone furoate-vilanterol  1 puff Inhalation q morning   metoprolol tartrate  12.5 mg Oral BID   nicotine  21 mg Transdermal Daily   vitamin B-12  1,000 mcg Oral Daily   zolpidem  5 mg Oral QHS   Continuous Infusions:  sodium chloride     promethazine (PHENERGAN) injection (IM or IVPB) Stopped (06/24/21 1549)     LOS: 1 day    Time spent: 30 minutes    Barton Dubois, MD [facility]   To contact the attending provider  between 7A-7P or the covering provider during after hours 7P-7A, please log into the web site [email or web address] and access using universal [place] password for that web site. If you do not have the password, please call the hospital operator.  06/24/2021, 6:17 PM

## 2021-06-24 NOTE — ED Notes (Signed)
Gastroenterology at bedside.

## 2021-06-24 NOTE — ED Notes (Signed)
Patient states that he is very nauseated and has a headache

## 2021-06-24 NOTE — Consult Note (Signed)
[facility] Consult  Reason for Consult: Possible cholecystitis, elevated LFTs ?choledocholithiasis  Referring Physician: Dr. Dyann Kief   Chief Complaint   Chest Pain     HPI: Robert Yates is a 61 y.o. male with COPD, palpitations but no cardiac history. Patient comes in with epigastric and chest pain that was radiating to the RUQ and radiating pain to the shoulder.  He had some nausea and vomiting but had EMS come to bring him to the hospital. LFTs were elevated and [place] was done without any gallbladder wall thickening or fluid, noted stones and mildly enlarged CBD. MRCP was recommended and completed demonstrating no acute cholecystitis and no obvious CBD stone but he also had aberrant anatomy on the MRCP with his right hepatic duct. He is feeling a little better and wants to eat something. He says the flagyl made him nauseated. He has no leukocytosis and LFTs were improving this AM.   Past Medical History:  Diagnosis Date   COPD (chronic obstructive pulmonary disease) (HCC)    Palpitations     Past Surgical History:  Procedure Laterality Date   skin grafts     TONSILLECTOMY      Family History  Adopted: Yes  Problem Relation Age of Onset   Colon cancer Brother 26    Social History   Tobacco Use   Smoking status: Some Days    Packs/day: 1.00    Types: Cigarettes    Start date: 11/07/1974   Smokeless tobacco: Never   Tobacco comments:    1 pack a day 06/10/21   Vaping Use   Vaping Use: Never used  Substance Use Topics   Alcohol use: Yes    Comment: occasional   Drug use: Not Currently    Comment: Nothing in 30 years    Medications: I have reviewed the patient's current medications. Prior to Admission:  Medications Prior to Admission  Medication Sig Dispense Refill Last Dose   acetaminophen (TYLENOL) 500 MG tablet Take 1,000 mg by mouth every 6 (six) hours as needed.   06/22/2021   fluticasone furoate-vilanterol (BREO ELLIPTA) 100-25 MCG/INH AEPB  Inhale 1 puff into the lungs every morning. 28 each 11 06/23/2021   ibuprofen (ADVIL) 400 MG tablet Take 400 mg by mouth as needed.    unknown   Ibuprofen-diphenhydrAMINE HCl (ADVIL PM) 200-25 MG CAPS Take by mouth at bedtime.   06/22/2021   vitamin B-12 (CYANOCOBALAMIN) 1000 MCG tablet Take 1,000 mcg by mouth daily.   06/22/2021   fluticasone furoate-vilanterol (BREO ELLIPTA) 100-25 MCG/INH AEPB Inhale 1 puff into the lungs daily for 14 doses. (Patient not taking: No sig reported) 14 each 0 Not Taking   fluticasone furoate-vilanterol (BREO ELLIPTA) 100-25 MCG/INH AEPB Inhale 1 puff into the lungs daily for 14 doses. (Patient not taking: No sig reported) 14 each 0 Not Taking   Melatonin 10 MG CAPS Take by mouth as needed. (Patient not taking: No sig reported)   Not Taking   VITAMIN D PO Take by mouth 3 (three) times a week. (Patient not taking: No sig reported)   Not Taking   Scheduled:  enoxaparin (LOVENOX) injection  40 mg Subcutaneous Q24H   fluticasone furoate-vilanterol  1 puff Inhalation q morning   metoprolol tartrate  12.5 mg Oral BID   nicotine  21 mg Transdermal Daily   vitamin B-12  1,000 mcg Oral Daily   zolpidem  5 mg Oral QHS   Continuous:  promethazine (PHENERGAN) injection (IM or IVPB) 25 mg (  06/24/21 1538)   [id number], bisacodyl, HYDROmorphone (DILAUDID) injection, ketorolac, ondansetron **OR** ondansetron (ZOFRAN) IV, prochlorperazine, promethazine (PHENERGAN) injection (IM or IVPB)  Allergies  Allergen Reactions   Sulfa Antibiotics Rash     ROS:  A comprehensive review of systems was negative except for: Gastrointestinal: positive for abdominal pain and nausea  Blood pressure 115/69, pulse 81, temperature 98.2 F (36.8 C), temperature source Oral, resp. rate 18, height '6\' 2"'$  (1.88 m), weight 78.9 kg, SpO2 98 %. Physical Exam Vitals reviewed.  Constitutional:      Appearance: He is well-developed.  HENT:     Head: Normocephalic.  Eyes:     Pupils: Pupils  are equal, round, and reactive to light.  Cardiovascular:     Rate and Rhythm: Normal rate and regular rhythm.  Pulmonary:     Effort: Pulmonary effort is normal.  Abdominal:     General: There is no abdominal bruit.     Palpations: Abdomen is soft.     Tenderness: There is abdominal tenderness in the right upper quadrant.  Musculoskeletal:        General: Normal range of motion.  Skin:    General: Skin is warm.  Neurological:     General: No focal deficit present.     Mental Status: He is alert and oriented to person, place, and time.  Psychiatric:        Mood and Affect: Mood normal.    Results: Results for orders placed or performed during the hospital encounter of 06/23/21 (from the past 48 hour(s))  Basic metabolic panel     Status: Abnormal   Collection Time: 06/23/21  1:55 PM  Result Value Ref Range   Sodium 138 135 - 145 mmol/L   Potassium 4.3 3.5 - 5.1 mmol/L   Chloride 104 98 - 111 mmol/L   CO2 25 22 - 32 mmol/L   Glucose, Bld 108 (H) 70 - 99 mg/dL    Comment: Glucose reference range applies only to samples taken after fasting for at least 8 hours.   BUN 10 8 - 23 mg/dL   Creatinine, Ser 0.76 0.61 - 1.24 mg/dL   Calcium 9.6 8.9 - 10.3 mg/dL   GFR, Estimated >60 >60 mL/min    Comment: (NOTE) Calculated using the CKD-EPI Creatinine Equation (2021)    Anion gap 9 5 - 15    Comment: Performed at [facility], [address], [place], [place] [address]  CBC     Status: Abnormal   Collection Time: 06/23/21  1:55 PM  Result Value Ref Range   WBC 7.9 4.0 - 10.5 K/uL   RBC 6.05 (H) 4.22 - 5.81 MIL/uL   Hemoglobin 18.1 (H) 13.0 - 17.0 g/dL   HCT 56.1 (H) 39.0 - 52.0 %   MCV 92.7 80.0 - 100.0 fL   MCH 29.9 26.0 - 34.0 pg   MCHC 32.3 30.0 - 36.0 g/dL   RDW 14.3 11.5 - 15.5 %   Platelets 246 150 - 400 K/uL   nRBC 0.0 0.0 - 0.2 %    Comment: Performed at [facility], [address] [place], [place] [address]  Troponin I (High Sensitivity)     Status: None    Collection Time: 06/23/21  1:55 PM  Result Value Ref Range   Troponin I (High Sensitivity) 3 <18 ng/L    Comment: (NOTE) Elevated high sensitivity troponin I (hsTnI) values and significant  changes across serial measurements may suggest ACS but many other  chronic and acute conditions  are known to elevate hsTnI results.  Refer to the "Links" section for chest pain algorithms and additional  guidance. Performed at [facility], [address] [place], [place] [address]   Lipase, blood     Status: None   Collection Time: 06/23/21  1:55 PM  Result Value Ref Range   Lipase 39 11 - 51 U/L    Comment: Performed at [facility], [address] [place], [place] [address]  Hepatic function panel     Status: Abnormal   Collection Time: 06/23/21  1:55 PM  Result Value Ref Range   Total Protein 7.6 6.5 - 8.1 g/dL   Albumin 4.5 3.5 - 5.0 g/dL   AST 447 (H) 15 - 41 U/L   ALT 355 (H) 0 - 44 U/L   Alkaline Phosphatase 97 38 - 126 U/L   Total Bilirubin 1.4 (H) 0.3 - 1.2 mg/dL   Bilirubin, Direct 0.3 (H) 0.0 - 0.2 mg/dL   Indirect Bilirubin 1.1 (H) 0.3 - 0.9 mg/dL    Comment: Performed at [facility], [address], [place], [place] [address]  HIV Antibody (routine testing w rflx)     Status: None   Collection Time: 06/23/21  1:55 PM  Result Value Ref Range   HIV Screen 4th Generation wRfx Non Reactive Non Reactive    Comment: Performed at [facility], [place] [address] [place], [place] [address]  Troponin I (High Sensitivity)     Status: None   Collection Time: 06/23/21  4:00 PM  Result Value Ref Range   Troponin I (High Sensitivity) 3 <18 ng/L    Comment: (NOTE) Elevated high sensitivity troponin I (hsTnI) values and significant  changes across serial measurements may suggest ACS but many other  chronic and acute conditions are known to elevate hsTnI results.  Refer to the "Links" section for chest pain algorithms and additional  guidance. Performed at [facility], [address] [place],  [address]   Magnesium     Status: None   Collection Time: 06/23/21  4:00 PM  Result Value Ref Range   Magnesium 2.3 1.7 - 2.4 mg/dL    Comment: Performed at [facility], [address], [place], [place] [address]  SARS CORONAVIRUS 2 (TAT 6-24 HRS) Nasopharyngeal Nasopharyngeal Swab     Status: None   Collection Time: 06/23/21  8:03 PM   Specimen: Nasopharyngeal Swab  Result Value Ref Range   SARS Coronavirus 2 NEGATIVE NEGATIVE    Comment: (NOTE) SARS-CoV-2 target nucleic acids are NOT DETECTED.  The SARS-CoV-2 RNA is generally detectable in upper and lower respiratory specimens during the acute phase of infection. Negative results do not preclude SARS-CoV-2 infection, do not rule out co-infections with other pathogens, and should not be used as the sole basis for treatment or other patient management decisions. Negative results must be combined with clinical observations, patient history, and epidemiological information. The expected result is Negative.  Fact Sheet for Patients: SugarRoll.be  Fact Sheet for Healthcare Providers: https://www.woods-mathews.com/  This test is not yet approved or cleared by the [place] FDA and  has been authorized for detection and/or diagnosis of SARS-CoV-2 by FDA under an Emergency Use Authorization (EUA). This EUA will remain  in effect (meaning this test can be used) for the duration of the COVID-19 declaration under Se ction 564(b)(1) of the Act, 21 U.S.C. section 360bbb-3(b)(1), unless the authorization is terminated or revoked sooner.  Performed at [facility], [place] [address]  [place], [place] [address]   Comprehensive metabolic panel     Status: Abnormal   Collection Time: 06/24/21  4:21 AM  Result Value Ref Range   Sodium 138 135 - 145 mmol/L   Potassium 3.7 3.5 - 5.1 mmol/L   Chloride 107 98 - 111 mmol/L   CO2 27 22 - 32 mmol/L   Glucose, Bld 122 (H) 70 - 99  mg/dL    Comment: Glucose reference range applies only to samples taken after fasting for at least 8 hours.   BUN 9 8 - 23 mg/dL   Creatinine, Ser 0.76 0.61 - 1.24 mg/dL   Calcium 8.3 (L) 8.9 - 10.3 mg/dL   Total Protein 5.9 (L) 6.5 - 8.1 g/dL   Albumin 3.5 3.5 - 5.0 g/dL   AST 249 (H) 15 - 41 U/L   ALT 380 (H) 0 - 44 U/L   Alkaline Phosphatase 96 38 - 126 U/L   Total Bilirubin 0.9 0.3 - 1.2 mg/dL   GFR, Estimated >60 >60 mL/min    Comment: (NOTE) Calculated using the CKD-EPI Creatinine Equation (2021)    Anion gap 4 (L) 5 - 15    Comment: Performed at [facility], [address], [place], [place] [address]  CBC with Differential/Platelet     Status: None   Collection Time: 06/24/21  4:21 AM  Result Value Ref Range   WBC 5.9 4.0 - 10.5 K/uL   RBC 5.19 4.22 - 5.81 MIL/uL   Hemoglobin 15.4 13.0 - 17.0 g/dL   HCT 47.8 39.0 - 52.0 %   MCV 92.1 80.0 - 100.0 fL   MCH 29.7 26.0 - 34.0 pg   MCHC 32.2 30.0 - 36.0 g/dL   RDW 13.8 11.5 - 15.5 %   Platelets 227 150 - 400 K/uL   nRBC 0.0 0.0 - 0.2 %   Neutrophils Relative % 74 %   Neutro Abs 4.4 1.7 - 7.7 K/uL   Lymphocytes Relative 13 %   Lymphs Abs 0.8 0.7 - 4.0 K/uL   Monocytes Relative 10 %   Monocytes Absolute 0.6 0.1 - 1.0 K/uL   Eosinophils Relative 1 %   Eosinophils Absolute 0.0 0.0 - 0.5 K/uL   Basophils Relative 1 %   Basophils Absolute 0.1 0.0 - 0.1 K/uL   Immature Granulocytes 1 %   Abs Immature Granulocytes 0.04 0.00 - 0.07 K/uL    Comment: Performed at [facility], [address], [place],  [address]   Personally reviewed [place] and MRCP- no wall thickening and stones noted, CBD slightly enlarged on [place], 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 EXAM: CHEST - 2 VIEW COMPARISON:  November 24, 2006. FINDINGS: The heart size and mediastinal contours are within normal limits. Both lungs are clear. The visualized skeletal  structures are unremarkable. IMPRESSION: No active cardiopulmonary disease. Electronically Signed   By: Marijo Conception M.D.   On: 06/23/2021 14:16   MR 3D Recon At Scanner  Addendum Date: 06/23/2021   ADDENDUM REPORT: 06/23/2021 18:50 ADDENDUM: Again no MRI findings of acute cholecystitis. If there are concerning physical exam findings could consider HIDA scan for further assessment as warranted. Variant biliary ductal anatomy was discussed with the ordering provider as outlined below. These results were called by telephone at the time of interpretation on 06/23/2021 at 6:50 pm to provider [facility] , who verbally acknowledged these results. Electronically Signed  By: Zetta Bills M.D.   On: 06/23/2021 18:50   Result Date: 06/23/2021 CLINICAL DATA:  RIGHT upper quadrant abdominal pain in a 60 year old male. Ultrasound equivocal for CBD stone. EXAM: MRI ABDOMEN WITHOUT AND WITH CONTRAST (INCLUDING MRCP) TECHNIQUE: Multiplanar multisequence MR imaging of the abdomen was performed both before and after the administration of intravenous contrast. Heavily T2-weighted images of the biliary and pancreatic ducts were obtained, and three-dimensional MRCP images were rendered by post processing. CONTRAST:  7.53m GADAVIST GADOBUTROL 1 MMOL/ML IV SOLN COMPARISON:  Ultrasound evaluation of the same date. FINDINGS: Lower chest: Lung bases are grossly clear. Limited assessment on MRI. Hepatobiliary: MRCP sequences are markedly limited by respiratory motion. There are numerous gallstones in the dependent gallbladder in the fundus and midportion of the gallbladder. No pericholecystic stranding, wall thickening or hyperenhancement of the gallbladder wall. Biliary anatomy is difficult to assess though there is suggestion of low insertion of a posterior division RIGHT hepatic duct upon the common hepatic duct just above the cystic duct confluence. No filling defect in the common bile duct to the extent that it can be  evaluated on MRCP sequences and axial MR images which are limited by respiratory motion. No focal, suspicious hepatic lesion. Images compromised by motion as outlined above. Portal vein is patent. Hepatic veins are patent. Pancreas: Normal intrinsic T1 signal. No ductal dilation or sign of inflammation. No focal lesion. Spleen:  Normal spleen. Adrenals/Urinary Tract: Adrenal glands are normal. No focal, suspicious renal lesion. No hydronephrosis. No perinephric stranding. Stomach/Bowel: No acute gastrointestinal process to the extent evaluated on abdominal MRI, not performed for bowel evaluation. Vascular/Lymphatic: No pathologically enlarged lymph nodes identified. No abdominal aortic aneurysm demonstrated. Atherosclerotic irregularity of the abdominal aorta. Other:  No ascites. Musculoskeletal: No suspicious bone lesions identified. IMPRESSION: Cholelithiasis without MR evidence of acute cholecystitis. Variant biliary anatomy which may have clinical significance, posterior division RIGHT hepatic duct with low insertion hepatic duct on the common above the cystic duct. If there is plan for cholecystectomy at some point in this patient, this variant can be clinically significant based on position of this duct relative to the cystic duct. No filling defect in the common bile duct. Note that images are limited due to respiratory motion on the current study as outlined above. These results will be called to the ordering clinician or representative by the Radiologist Assistant, and communication documented in the PACS or [employer] Electronically Signed: By: GZetta BillsM.D. On: 06/23/2021 18:42   MR ABDOMEN MRCP W WO CONTAST  Addendum Date: 06/23/2021   ADDENDUM REPORT: 06/23/2021 18:50 ADDENDUM: Again no MRI findings of acute cholecystitis. If there are concerning physical exam findings could consider HIDA scan for further assessment as warranted. Variant biliary ductal anatomy was discussed with the  ordering provider as outlined below. These results were called by telephone at the time of interpretation on 06/23/2021 at 6:50 pm [facility], who verbally acknowledged these results. Electronically Signed   By: GZetta BillsM.D.   On: 06/23/2021 18:50   Result Date: 06/23/2021 CLINICAL DATA:  RIGHT upper quadrant abdominal pain in a 60year old male. Ultrasound equivocal for CBD stone. EXAM: MRI ABDOMEN WITHOUT AND WITH CONTRAST (INCLUDING MRCP) TECHNIQUE: Multiplanar multisequence MR imaging of the abdomen was performed both before and after the administration of intravenous contrast. Heavily T2-weighted images of the biliary and pancreatic ducts were obtained, and three-dimensional MRCP images were rendered by post processing. CONTRAST:  7.574mGADAVIST GADOBUTROL 1 MMOL/ML IV  SOLN COMPARISON:  Ultrasound evaluation of the same date. FINDINGS: Lower chest: Lung bases are grossly clear. Limited assessment on MRI. Hepatobiliary: MRCP sequences are markedly limited by respiratory motion. There are numerous gallstones in the dependent gallbladder in the fundus and midportion of the gallbladder. No pericholecystic stranding, wall thickening or hyperenhancement of the gallbladder wall. Biliary anatomy is difficult to assess though there is suggestion of low insertion of a posterior division RIGHT hepatic duct upon the common hepatic duct just above the cystic duct confluence. No filling defect in the common bile duct to the extent that it can be evaluated on MRCP sequences and axial MR images which are limited by respiratory motion. No focal, suspicious hepatic lesion. Images compromised by motion as outlined above. Portal vein is patent. Hepatic veins are patent. Pancreas: Normal intrinsic T1 signal. No ductal dilation or sign of inflammation. No focal lesion. Spleen:  Normal spleen. Adrenals/Urinary Tract: Adrenal glands are normal. No focal, suspicious renal lesion. No hydronephrosis. No  perinephric stranding. Stomach/Bowel: No acute gastrointestinal process to the extent evaluated on abdominal MRI, not performed for bowel evaluation. Vascular/Lymphatic: No pathologically enlarged lymph nodes identified. No abdominal aortic aneurysm demonstrated. Atherosclerotic irregularity of the abdominal aorta. Other:  No ascites. Musculoskeletal: No suspicious bone lesions identified. IMPRESSION: Cholelithiasis without MR evidence of acute cholecystitis. Variant biliary anatomy which may have clinical significance, posterior division RIGHT hepatic duct with low insertion hepatic duct on the common above the cystic duct. If there is plan for cholecystectomy at some point in this patient, this variant can be clinically significant based on position of this duct relative to the cystic duct. No filling defect in the common bile duct. Note that images are limited due to respiratory motion on the current study as outlined above. These results will be called to the ordering clinician or representative by the Radiologist Assistant, and communication documented in the PACS or [employer]. Electronically Signed: By: Zetta Bills M.D. On: 06/23/2021 18:42   US Abdomen Limited RUQ (LIVER/GB)  Result Date: 06/23/2021 CLINICAL DATA:  Right upper quadrant pain EXAM: ULTRASOUND ABDOMEN LIMITED RIGHT UPPER QUADRANT COMPARISON:  None. FINDINGS: Gallbladder: Small stones and sludge seen in the gallbladder. No gallbladder wall thickening. Positive Murphy sign noted by sonographer. Common bile duct: Diameter: 6 mm Liver: No focal lesion identified. Increased parenchymal echogenicity. Portal vein is patent on color Doppler imaging with normal direction of blood flow towards the liver. Other: None. IMPRESSION: Cholelithiasis and positive Murphy's sign, findings suggest acute cholecystitis. Mildly dilated common bile duct. MRCP could be performed if there is concern for choledocholithiasis. Hepatic steatosis. Electronically  Signed   By: Yetta Glassman M.D.   On: 06/23/2021 17:03     Assessment & Plan:  SHAVONTE PORZIO is a 60 y.o. male with cholelithiasis and likely choledocholithiasis that passed during this episode given the LFTs. MRCP with aberrant anatomy. I have discussed the patient with Dr. Michaelle Birks, Hepatobiliary, and have discussed things with the patient, wife, GI and Dr. Dyann Kief. Given the anatomy I think he should have his cholecystectomy with someone that has more expertise in the hepatobiliary realm. No signs of cholecystitis on MRCP, so antibiotics were stopped.   Diet now, Lfts in the AM, if all improving plan for outpatient cholecystectomy   -Dr. Zenia Resides will see patient as an outpatient. Patient and wife in agreement.   All questions were answered to the satisfaction of the patient and family.   Virl Cagey 06/24/2021, 4:23 PM

## 2021-06-24 NOTE — ED Notes (Signed)
Wife at bedside; gave update. Requesting to speak with Dr Marland Kitchen  Constance Haw and [facility] paged. Medicated pt for ongoing nausea and headache.

## 2021-06-24 NOTE — Progress Notes (Signed)
2205 called into room d/t nosebleed. Large amount of thin red blood noted on bed covers, hands and pillowcase. Advised to pinch nose and tuck chin to chest, washcloth was provided at this time. Appeared to be coming from right nare. Notified MD Somalia Zierle-Ghosh and gave patient cotton ball. New order for Aferin, stat [facility] and hold anticoagulant. Patient had already refused lovenox.   2230 bleeding noted to slow down, aferin was administered as ordered. Will continue to monitor.

## 2021-06-24 NOTE — ED Notes (Signed)
ED TO INPATIENT HANDOFF REPORT  ED Nurse Name and Phone #: Jerene Pitch [phone]  S Name/Age/Gender Robert Yates 60 y.o. male Room/Bed: APA12/APA12  Code Status   Code Status: Full Code  Home/SNF/Other Home Patient oriented to: self, place, time, and situation Is this baseline? Yes   Triage Complete: Triage complete  Chief Complaint Cholecystitis [K81.9] Acute cholecystitis [K81.0]  Triage Note Pt to er, pt states that this am he was sitting watching tv and started having chest pain, states that he called 911 and then felt better so they canceled them, states that shortly after the pain came back, states that he felt weak and diaphoretic.     Allergies Allergies  Allergen Reactions   Sulfa Antibiotics Rash    Level of Care/Admitting Diagnosis ED Disposition     ED Disposition  Admit   Condition  --   [place]: [facility] [id number]  Level of Care: Telemetry [5]  Covid Evaluation: Asymptomatic Screening Protocol (No Symptoms)  Diagnosis: Acute cholecystitis [575.0.ICD-9-CM]  Admitting Physician: Lequita Halt [id number]  Attending Physician: Lequita Halt [id number]  Estimated length of stay: past midnight tomorrow  Certification:: I certify this patient will need inpatient services for at least 2 midnights          B Medical/Surgery History Past Medical History:  Diagnosis Date   COPD (chronic obstructive pulmonary disease) ([place])    Palpitations    Past Surgical History:  Procedure Laterality Date   skin grafts     TONSILLECTOMY       A IV Location/Drains/Wounds Patient Lines/Drains/Airways Status     Active Line/Drains/Airways     Name Placement date Placement time Site Days   Peripheral IV 06/23/21 20 G Anterior;Left Forearm 06/23/21  1730  Forearm  1            Intake/Output Last 24 hours  Intake/Output Summary (Last 24 hours) at 06/24/2021 1424 Last data filed at 06/23/2021 2321 Gross per 24 hour  Intake 1815.71  ml  Output --  Net 1815.71 ml    Labs/Imaging Results for orders placed or performed during the hospital encounter of 06/23/21 (from the past 48 hour(s))  Basic metabolic panel     Status: Abnormal   Collection Time: 06/23/21  1:55 PM  Result Value Ref Range   Sodium 138 135 - 145 mmol/L   Potassium 4.3 3.5 - 5.1 mmol/L   Chloride 104 98 - 111 mmol/L   CO2 25 22 - 32 mmol/L   Glucose, Bld 108 (H) 70 - 99 mg/dL    Comment: Glucose reference range applies only to samples taken after fasting for at least 8 hours.   BUN 10 8 - 23 mg/dL   Creatinine, Ser 0.76 0.61 - 1.24 mg/dL   Calcium 9.6 8.9 - 10.3 mg/dL   GFR, Estimated >60 >60 mL/min    Comment: (NOTE) Calculated using the CKD-EPI Creatinine Equation (2021)    Anion gap 9 5 - 15    Comment: Performed at [facility], [address], [place], [place] [address]  CBC     Status: Abnormal   Collection Time: 06/23/21  1:55 PM  Result Value Ref Range   WBC 7.9 4.0 - 10.5 K/uL   RBC 6.05 (H) 4.22 - 5.81 MIL/uL   Hemoglobin 18.1 (H) 13.0 - 17.0 g/dL   HCT 56.1 (H) 39.0 - 52.0 %   MCV 92.7 80.0 - 100.0 fL   MCH 29.9 26.0 - 34.0 pg  MCHC 32.3 30.0 - 36.0 g/dL   RDW 14.3 11.5 - 15.5 %   Platelets 246 150 - 400 K/uL   nRBC 0.0 0.0 - 0.2 %    Comment: Performed at [facility], [address], [place], [place] [address]  Troponin I (High Sensitivity)     Status: None   Collection Time: 06/23/21  1:55 PM  Result Value Ref Range   Troponin I (High Sensitivity) 3 <18 ng/L    Comment: (NOTE) Elevated high sensitivity troponin I (hsTnI) values and significant  changes across serial measurements may suggest ACS but many other  chronic and acute conditions are known to elevate hsTnI results.  Refer to the "Links" section for chest pain algorithms and additional  guidance. Performed at [facility], [address] [place], [place] [address]   Lipase, blood     Status: None   Collection Time: 06/23/21  1:55 PM  Result Value Ref Range    Lipase 39 11 - 51 U/L    Comment: Performed at [facility], [address] [place], [place] [address]  Hepatic function panel     Status: Abnormal   Collection Time: 06/23/21  1:55 PM  Result Value Ref Range   Total Protein 7.6 6.5 - 8.1 g/dL   Albumin 4.5 3.5 - 5.0 g/dL   AST 447 (H) 15 - 41 U/L   ALT 355 (H) 0 - 44 U/L   Alkaline Phosphatase 97 38 - 126 U/L   Total Bilirubin 1.4 (H) 0.3 - 1.2 mg/dL   Bilirubin, Direct 0.3 (H) 0.0 - 0.2 mg/dL   Indirect Bilirubin 1.1 (H) 0.3 - 0.9 mg/dL    Comment: Performed at [facility], [address] [place], [place] [address]  HIV Antibody (routine testing w rflx)     Status: None   Collection Time: 06/23/21  1:55 PM  Result Value Ref Range   HIV Screen 4th Generation wRfx Non Reactive Non Reactive    Comment: Performed at [facility], [place] [address] [place], [place] [address]  Troponin I (High Sensitivity)     Status: None   Collection Time: 06/23/21  4:00 PM  Result Value Ref Range   Troponin I (High Sensitivity) 3 <18 ng/L    Comment: (NOTE) Elevated high sensitivity troponin I (hsTnI) values and significant  changes across serial measurements may suggest ACS but many other  chronic and acute conditions are known to elevate hsTnI results.  Refer to the "Links" section for chest pain algorithms and additional  guidance. Performed at [facility], [address], [place], [place] [address]   Magnesium     Status: None   Collection Time: 06/23/21  4:00 PM  Result Value Ref Range   Magnesium 2.3 1.7 - 2.4 mg/dL    Comment: Performed at [facility], [address], [place], [place] [address]  Comprehensive metabolic panel     Status: Abnormal   Collection Time: 06/24/21  4:21 AM  Result Value Ref Range   Sodium 138 135 - 145 mmol/L   Potassium 3.7 3.5 - 5.1 mmol/L   Chloride 107 98 - 111 mmol/L   CO2 27 22 - 32 mmol/L   Glucose, Bld 122 (H) 70 - 99 mg/dL    Comment: Glucose reference range applies only to samples taken after  fasting for at least 8 hours.   BUN 9 8 - 23 mg/dL   Creatinine, Ser 0.76 0.61 - 1.24 mg/dL   Calcium 8.3 (L) 8.9 -  10.3 mg/dL   Total Protein 5.9 (L) 6.5 - 8.1 g/dL   Albumin 3.5 3.5 - 5.0 g/dL   AST 249 (H) 15 - 41 U/L   ALT 380 (H) 0 - 44 U/L   Alkaline Phosphatase 96 38 - 126 U/L   Total Bilirubin 0.9 0.3 - 1.2 mg/dL   GFR, Estimated >60 >60 mL/min    Comment: (NOTE) Calculated using the CKD-EPI Creatinine Equation (2021)    Anion gap 4 (L) 5 - 15    Comment: Performed at [facility], [address] [place], [place] [address]  CBC with Differential/Platelet     Status: None   Collection Time: 06/24/21  4:21 AM  Result Value Ref Range   WBC 5.9 4.0 - 10.5 K/uL   RBC 5.19 4.22 - 5.81 MIL/uL   Hemoglobin 15.4 13.0 - 17.0 g/dL   HCT 47.8 39.0 - 52.0 %   MCV 92.1 80.0 - 100.0 fL   MCH 29.7 26.0 - 34.0 pg   MCHC 32.2 30.0 - 36.0 g/dL   RDW 13.8 11.5 - 15.5 %   Platelets 227 150 - 400 K/uL   nRBC 0.0 0.0 - 0.2 %   Neutrophils Relative % 74 %   Neutro Abs 4.4 1.7 - 7.7 K/uL   Lymphocytes Relative 13 %   Lymphs Abs 0.8 0.7 - 4.0 K/uL   Monocytes Relative 10 %   Monocytes Absolute 0.6 0.1 - 1.0 K/uL   Eosinophils Relative 1 %   Eosinophils Absolute 0.0 0.0 - 0.5 K/uL   Basophils Relative 1 %   Basophils Absolute 0.1 0.0 - 0.1 K/uL   Immature Granulocytes 1 %   Abs Immature Granulocytes 0.04 0.00 - 0.07 K/uL    Comment: Performed at [facility], [address], [place], [place] [address]   DG Chest 2 View  Result Date: 06/23/2021 CLINICAL DATA:  Chest pain. EXAM: CHEST - 2 VIEW COMPARISON:  November 24, 2006. FINDINGS: The heart size and mediastinal contours are within normal limits. Both lungs are clear. The visualized skeletal structures are unremarkable. IMPRESSION: No active cardiopulmonary disease. Electronically Signed   By: Marijo Conception M.D.   On: 06/23/2021 14:16   MR 3D Recon At Scanner  Addendum Date: 06/23/2021   ADDENDUM REPORT: 06/23/2021 18:50 ADDENDUM:  Again no MRI findings of acute cholecystitis. If there are concerning physical exam findings could consider HIDA scan for further assessment as warranted. Variant biliary ductal anatomy was discussed with the ordering provider as outlined below. These results were called by telephone at the time of interpretation on 06/23/2021 at 6:50 pm to provider [facility] , who verbally acknowledged these results. Electronically Signed   By: Zetta Bills M.D.   On: 06/23/2021 18:50   Result Date: 06/23/2021 CLINICAL DATA:  RIGHT upper quadrant abdominal pain in a 60 year old male. Ultrasound equivocal for CBD stone. EXAM: MRI ABDOMEN WITHOUT AND WITH CONTRAST (INCLUDING MRCP) TECHNIQUE: Multiplanar multisequence MR imaging of the abdomen was performed both before and after the administration of intravenous contrast. Heavily T2-weighted images of the biliary and pancreatic ducts were obtained, and three-dimensional MRCP images were rendered by post processing. CONTRAST:  7.42m GADAVIST GADOBUTROL 1 MMOL/ML IV SOLN COMPARISON:  Ultrasound evaluation of the same date. FINDINGS: Lower chest: Lung bases are grossly clear. Limited assessment on MRI. Hepatobiliary: MRCP sequences are markedly limited by respiratory motion. There are numerous gallstones in the dependent gallbladder in the fundus and midportion of the gallbladder. No pericholecystic stranding, wall thickening  or hyperenhancement of the gallbladder wall. Biliary anatomy is difficult to assess though there is suggestion of low insertion of a posterior division RIGHT hepatic duct upon the common hepatic duct just above the cystic duct confluence. No filling defect in the common bile duct to the extent that it can be evaluated on MRCP sequences and axial MR images which are limited by respiratory motion. No focal, suspicious hepatic lesion. Images compromised by motion as outlined above. Portal vein is patent. Hepatic veins are patent. Pancreas: Normal intrinsic T1  signal. No ductal dilation or sign of inflammation. No focal lesion. Spleen:  Normal spleen. Adrenals/Urinary Tract: Adrenal glands are normal. No focal, suspicious renal lesion. No hydronephrosis. No perinephric stranding. Stomach/Bowel: No acute gastrointestinal process to the extent evaluated on abdominal MRI, not performed for bowel evaluation. Vascular/Lymphatic: No pathologically enlarged lymph nodes identified. No abdominal aortic aneurysm demonstrated. Atherosclerotic irregularity of the abdominal aorta. Other:  No ascites. Musculoskeletal: No suspicious bone lesions identified. IMPRESSION: Cholelithiasis without MR evidence of acute cholecystitis. Variant biliary anatomy which may have clinical significance, posterior division RIGHT hepatic duct with low insertion hepatic duct on the common above the cystic duct. If there is plan for cholecystectomy at some point in this patient, this variant can be clinically significant based on position of this duct relative to the cystic duct. No filling defect in the common bile duct. Note that images are limited due to respiratory motion on the current study as outlined above. These results will be called to the ordering clinician or representative by the Radiologist Assistant, and communication documented in the PACS or [employer]. Electronically Signed: By: Zetta Bills M.D. On: 06/23/2021 18:42   MR ABDOMEN MRCP W WO CONTAST  Addendum Date: 06/23/2021   ADDENDUM REPORT: 06/23/2021 18:50 ADDENDUM: Again no MRI findings of acute cholecystitis. If there are concerning physical exam findings could consider HIDA scan for further assessment as warranted. Variant biliary ductal anatomy was discussed with the ordering provider as outlined below. These results were called by telephone at the time of interpretation on 06/23/2021 at 6:50 pm to provider [facility] , who verbally acknowledged these results. Electronically Signed   By: Zetta Bills M.D.   On:  06/23/2021 18:50   Result Date: 06/23/2021 CLINICAL DATA:  RIGHT upper quadrant abdominal pain in a 60 year old male. Ultrasound equivocal for CBD stone. EXAM: MRI ABDOMEN WITHOUT AND WITH CONTRAST (INCLUDING MRCP) TECHNIQUE: Multiplanar multisequence MR imaging of the abdomen was performed both before and after the administration of intravenous contrast. Heavily T2-weighted images of the biliary and pancreatic ducts were obtained, and three-dimensional MRCP images were rendered by post processing. CONTRAST:  7.53m GADAVIST GADOBUTROL 1 MMOL/ML IV SOLN COMPARISON:  Ultrasound evaluation of the same date. FINDINGS: Lower chest: Lung bases are grossly clear. Limited assessment on MRI. Hepatobiliary: MRCP sequences are markedly limited by respiratory motion. There are numerous gallstones in the dependent gallbladder in the fundus and midportion of the gallbladder. No pericholecystic stranding, wall thickening or hyperenhancement of the gallbladder wall. Biliary anatomy is difficult to assess though there is suggestion of low insertion of a posterior division RIGHT hepatic duct upon the common hepatic duct just above the cystic duct confluence. No filling defect in the common bile duct to the extent that it can be evaluated on MRCP sequences and axial MR images which are limited by respiratory motion. No focal, suspicious hepatic lesion. Images compromised by motion as outlined above. Portal vein is patent. Hepatic veins are patent. Pancreas: Normal  intrinsic T1 signal. No ductal dilation or sign of inflammation. No focal lesion. Spleen:  Normal spleen. Adrenals/Urinary Tract: Adrenal glands are normal. No focal, suspicious renal lesion. No hydronephrosis. No perinephric stranding. Stomach/Bowel: No acute gastrointestinal process to the extent evaluated on abdominal MRI, not performed for bowel evaluation. Vascular/Lymphatic: No pathologically enlarged lymph nodes identified. No abdominal aortic aneurysm demonstrated.  Atherosclerotic irregularity of the abdominal aorta. Other:  No ascites. Musculoskeletal: No suspicious bone lesions identified. IMPRESSION: Cholelithiasis without MR evidence of acute cholecystitis. Variant biliary anatomy which may have clinical significance, posterior division RIGHT hepatic duct with low insertion hepatic duct on the common above the cystic duct. If there is plan for cholecystectomy at some point in this patient, this variant can be clinically significant based on position of this duct relative to the cystic duct. No filling defect in the common bile duct. Note that images are limited due to respiratory motion on the current study as outlined above. These results will be called to the ordering clinician or representative by the Radiologist Assistant, and communication documented in the PACS or [employer]. Electronically Signed: By: Zetta Bills M.D. On: 06/23/2021 18:42   US Abdomen Limited RUQ (LIVER/GB)  Result Date: 06/23/2021 CLINICAL DATA:  Right upper quadrant pain EXAM: ULTRASOUND ABDOMEN LIMITED RIGHT UPPER QUADRANT COMPARISON:  None. FINDINGS: Gallbladder: Small stones and sludge seen in the gallbladder. No gallbladder wall thickening. Positive Murphy sign noted by sonographer. Common bile duct: Diameter: 6 mm Liver: No focal lesion identified. Increased parenchymal echogenicity. Portal vein is patent on color Doppler imaging with normal direction of blood flow towards the liver. Other: None. IMPRESSION: Cholelithiasis and positive Murphy's sign, findings suggest acute cholecystitis. Mildly dilated common bile duct. MRCP could be performed if there is concern for choledocholithiasis. Hepatic steatosis. Electronically Signed   By: Yetta Glassman M.D.   On: 06/23/2021 17:03    Pending Labs Unresulted Labs (From admission, onward)     Start     Ordered   06/25/21 0500  Hepatic function panel  Tomorrow morning,   R        06/24/21 1242   06/23/21 1910  SARS CORONAVIRUS  2 (TAT 6-24 HRS) Nasopharyngeal Nasopharyngeal Swab  (Tier 3 - Symptomatic/asymptomatic)  Once,   STAT       Question Answer Comment  Is this test for diagnosis or screening Screening   Symptomatic for COVID-19 as defined by CDC No   Hospitalized for COVID-19 No   Admitted to ICU for COVID-19 No   Previously tested for COVID-19 No   Resident in a congregate (group) care setting No   Employed in healthcare setting No   Has patient completed COVID vaccination(s) (2 doses of Pfizer/Moderna 1 dose of [employer]) Unknown      06/23/21 1909            Vitals/Pain Today's Vitals   06/24/21 1307 06/24/21 1332 06/24/21 1356 06/24/21 1400  BP:  121/73  (!) 117/59  Pulse: 64 65  68  Resp: (!) '23 20  20  '$ Temp:      TempSrc:      SpO2: 94% 94%  96%  Weight:      Height:      PainSc:   Asleep     Isolation Precautions No active isolations  Medications Medications  acetaminophen (TYLENOL) tablet 1,000 mg (has no administration in time range)  vitamin B-12 (CYANOCOBALAMIN) tablet 1,000 mcg (1,000 mcg Oral Given 06/24/21 0943)  fluticasone furoate-vilanterol (BREO  ELLIPTA) 100-25 MCG/INH 1 puff (1 puff Inhalation Given 06/24/21 0812)  enoxaparin (LOVENOX) injection 40 mg (40 mg Subcutaneous Given 06/23/21 2027)  bisacodyl (DULCOLAX) EC tablet 5 mg (has no administration in time range)  ondansetron (ZOFRAN) tablet 4 mg ( Oral See Alternative 06/24/21 0946)    Or  ondansetron (ZOFRAN) injection 4 mg (4 mg Intravenous Given 06/24/21 0946)  nicotine (NICODERM CQ - dosed in mg/24 hours) patch 21 mg (21 mg Transdermal Patch Removed 06/24/21 0942)  metoprolol tartrate (LOPRESSOR) tablet 12.5 mg (12.5 mg Oral Given 06/24/21 0942)  0.9 %  sodium chloride infusion ( Intravenous Not Given 06/24/21 1402)  ketorolac (TORADOL) 15 MG/ML injection 15 mg (15 mg Intravenous Given 06/23/21 2050)  HYDROmorphone (DILAUDID) injection 0.5 mg (0.5 mg Intravenous Given 06/24/21 0946)  zolpidem (AMBIEN) tablet  5 mg (5 mg Oral Given 06/23/21 2236)  promethazine (PHENERGAN) 25 mg in sodium chloride 0.9 % 50 mL IVPB (has no administration in time range)  prochlorperazine (COMPAZINE) injection 10 mg (10 mg Intravenous Given 06/24/21 1110)  HYDROmorphone (DILAUDID) injection 0.5 mg (0.5 mg Intravenous Given 06/23/21 1733)  ondansetron (ZOFRAN) injection 4 mg (4 mg Intravenous Given 06/23/21 1731)  sodium chloride 0.9 % bolus 1,000 mL (0 mLs Intravenous Stopped 06/23/21 1932)  gadobutrol (GADAVIST) 1 MMOL/ML injection 7.5 mL (7.5 mLs Intravenous Contrast Given 06/23/21 1808)    Mobility walks Low fall risk   Focused Assessments    R Recommendations: See Admitting Provider Note  Report given to:   Additional Notes:

## 2021-06-24 NOTE — Progress Notes (Signed)
Patient with nosebleed over night. Advised holding lovenox. Ordered Aferin and also stat HH. Advised holding pressure.

## 2021-06-25 DIAGNOSIS — Z720 Tobacco use: Secondary | ICD-10-CM

## 2021-06-25 DIAGNOSIS — F17200 Nicotine dependence, unspecified, uncomplicated: Secondary | ICD-10-CM

## 2021-06-25 DIAGNOSIS — K8020 Calculus of gallbladder without cholecystitis without obstruction: Secondary | ICD-10-CM

## 2021-06-25 LAB — COMPREHENSIVE METABOLIC PANEL
ALT: 480 U/L — ABNORMAL HIGH (ref 0–44)
AST: 267 U/L — ABNORMAL HIGH (ref 15–41)
Albumin: 3.3 g/dL — ABNORMAL LOW (ref 3.5–5.0)
Alkaline Phosphatase: 154 U/L — ABNORMAL HIGH (ref 38–126)
Anion gap: 5 (ref 5–15)
BUN: 7 mg/dL — ABNORMAL LOW (ref 8–23)
CO2: 27 mmol/L (ref 22–32)
Calcium: 8.5 mg/dL — ABNORMAL LOW (ref 8.9–10.3)
Chloride: 108 mmol/L (ref 98–111)
Creatinine, Ser: 0.91 mg/dL (ref 0.61–1.24)
GFR, Estimated: >60 mL/min (ref >60)
Glucose, Bld: 101 mg/dL — ABNORMAL HIGH (ref 70–99)
Potassium: 4.1 mmol/L (ref 3.5–5.1)
Sodium: 140 mmol/L (ref 135–145)
Total Bilirubin: 1.2 mg/dL (ref 0.3–1.2)
Total Protein: 5.8 g/dL — ABNORMAL LOW (ref 6.5–8.1)

## 2021-06-25 MED ORDER — PROMETHAZINE HCL 25 MG RE SUPP: 25.0000 mg | Freq: Four times a day (QID) | RECTAL | 0 refills | Status: DC | PRN

## 2021-06-25 MED ORDER — PROMETHAZINE HCL 25 MG PO TABS: 25.0000 mg | ORAL_TABLET | Freq: Four times a day (QID) | ORAL | 0 refills | Status: DC | PRN

## 2021-06-25 MED ORDER — ALUM & MAG HYDROXIDE-SIMETH 200-200-20 MG/5ML PO SUSP
30.0000 mL | ORAL | Status: DC | PRN
Start: 2021-06-25 — End: 2021-06-25
  Administered 2021-06-25: 30 mL via ORAL
  Filled 2021-06-25: qty 30

## 2021-06-25 MED ORDER — METOPROLOL TARTRATE 25 MG PO TABS: 12.5000 mg | ORAL_TABLET | Freq: Two times a day (BID) | ORAL | 1 refills | Status: DC

## 2021-06-25 MED ORDER — OXYCODONE HCL 5 MG PO TABS: 5.0000 mg | ORAL_TABLET | Freq: Four times a day (QID) | ORAL | 0 refills | Status: DC | PRN

## 2021-06-25 MED ORDER — NICOTINE 21 MG/24HR TD PT24: 21.0000 mg | MEDICATED_PATCH | Freq: Every day | TRANSDERMAL | 0 refills | Status: DC

## 2021-06-25 NOTE — Progress Notes (Signed)
Subjective: Feels well this morning.  Denies abdominal pain, nausea, or vomiting.  The symptoms resolved around midday yesterday.  Had mashed potatoes and a biscuit for dinner and tolerated this well.  Denies mental status changes.  Denies any significant abdominal pain prior to this episode, but reports he has been having a lot of indigestion and intermittent hiccups over the last month.  Would like to go home today if he can.  Denies Tylenol, over-the-counter supplements aside, history of IV or intranasal drug use, tattoos, or exposures to hepatitis.  Objective: Vital signs in last 24 hours: Temp:  [98.2 F (36.8 C)-99.1 F (37.3 C)] 98.8 F (37.1 C) (09/16 0508) Pulse Rate:  [29-114] 81 (09/16 0508) Resp:  [12-28] 18 (09/16 0508) BP: (85-127)/(32-77) 101/77 (09/16 0508) SpO2:  [88 %-99 %] 94 % (09/16 0508)   General:   Alert and oriented, pleasant, no acute distress. Head:  Normocephalic and atraumatic. Eyes:  No icterus, sclera clear. Conjuctiva pink.  Abdomen:  Bowel sounds present, soft, non-tender, non-distended. No HSM or hernias noted. No rebound or guarding. No masses appreciated  Msk:  Symmetrical without gross deformities. Normal posture. Extremities:  Without edema. Neurologic:  Alert and  oriented x4;  grossly normal neurologically. Skin:  Warm and dry, intact without significant lesions.  Psych: Normal mood and affect.  Intake/Output from previous day: 09/15 0701 - 09/16 0700 In: 458.9 [I.V.:408.9; IV Piggyback:50] Out: -  Intake/Output this shift: No intake/output data recorded.  Lab Results: Recent Labs    06/23/21 1355 06/24/21 0421 06/24/21 2235  WBC 7.9 5.9  --   HGB 18.1* 15.4 14.8  HCT 56.1* 47.8 45.3  PLT 246 227  --    BMET Recent Labs    06/23/21 1355 06/24/21 0421 06/25/21 0609  NA 138 138 140  K 4.3 3.7 4.1  CL 104 107 108  CO2 25 27 27   GLUCOSE 108* 122* 101*  BUN 10 9 7*  CREATININE 0.76 0.76 0.91  CALCIUM 9.6 8.3* 8.5*    LFT Recent Labs    06/23/21 1355 06/24/21 0421 06/25/21 0609  PROT 7.6 5.9* 5.8*  ALBUMIN 4.5 3.5 3.3*  AST 447* 249* 267*  ALT 355* 380* 480*  ALKPHOS 97 96 154*  BILITOT 1.4* 0.9 1.2  BILIDIR 0.3*  --   --   IBILI 1.1*  --   --    Studies/Results: DG Chest 2 View  Result Date: 06/23/2021 CLINICAL DATA:  Chest pain. EXAM: CHEST - 2 VIEW COMPARISON:  November 24, 2006. FINDINGS: The heart size and mediastinal contours are within normal limits. Both lungs are clear. The visualized skeletal structures are unremarkable. IMPRESSION: No active cardiopulmonary disease. Electronically Signed   By: Marijo Conception M.D.   On: 06/23/2021 14:16   MR 3D Recon At Scanner  Addendum Date: 06/23/2021   ADDENDUM REPORT: 06/23/2021 18:50 ADDENDUM: Again no MRI findings of acute cholecystitis. If there are concerning physical exam findings could consider HIDA scan for further assessment as warranted. Variant biliary ductal anatomy was discussed with the ordering provider as outlined below. These results were called by telephone at the time of interpretation on 06/23/2021 at 6:50 pm to provider [facility] , who verbally acknowledged these results. Electronically Signed   By: Zetta Bills M.D.   On: 06/23/2021 18:50   Result Date: 06/23/2021 CLINICAL DATA:  RIGHT upper quadrant abdominal pain in a 60 year old male. Ultrasound equivocal for CBD stone. EXAM: MRI ABDOMEN WITHOUT AND WITH CONTRAST (INCLUDING  MRCP) TECHNIQUE: Multiplanar multisequence MR imaging of the abdomen was performed both before and after the administration of intravenous contrast. Heavily T2-weighted images of the biliary and pancreatic ducts were obtained, and three-dimensional MRCP images were rendered by post processing. CONTRAST:  7.80m GADAVIST GADOBUTROL 1 MMOL/ML IV SOLN COMPARISON:  Ultrasound evaluation of the same date. FINDINGS: Lower chest: Lung bases are grossly clear. Limited assessment on MRI. Hepatobiliary: MRCP  sequences are markedly limited by respiratory motion. There are numerous gallstones in the dependent gallbladder in the fundus and midportion of the gallbladder. No pericholecystic stranding, wall thickening or hyperenhancement of the gallbladder wall. Biliary anatomy is difficult to assess though there is suggestion of low insertion of a posterior division RIGHT hepatic duct upon the common hepatic duct just above the cystic duct confluence. No filling defect in the common bile duct to the extent that it can be evaluated on MRCP sequences and axial MR images which are limited by respiratory motion. No focal, suspicious hepatic lesion. Images compromised by motion as outlined above. Portal vein is patent. Hepatic veins are patent. Pancreas: Normal intrinsic T1 signal. No ductal dilation or sign of inflammation. No focal lesion. Spleen:  Normal spleen. Adrenals/Urinary Tract: Adrenal glands are normal. No focal, suspicious renal lesion. No hydronephrosis. No perinephric stranding. Stomach/Bowel: No acute gastrointestinal process to the extent evaluated on abdominal MRI, not performed for bowel evaluation. Vascular/Lymphatic: No pathologically enlarged lymph nodes identified. No abdominal aortic aneurysm demonstrated. Atherosclerotic irregularity of the abdominal aorta. Other:  No ascites. Musculoskeletal: No suspicious bone lesions identified. IMPRESSION: Cholelithiasis without MR evidence of acute cholecystitis. Variant biliary anatomy which may have clinical significance, posterior division RIGHT hepatic duct with low insertion hepatic duct on the common above the cystic duct. If there is plan for cholecystectomy at some point in this patient, this variant can be clinically significant based on position of this duct relative to the cystic duct. No filling defect in the common bile duct. Note that images are limited due to respiratory motion on the current study as outlined above. These results will be called to the  ordering clinician or representative by the Radiologist Assistant, and communication documented in the PACS or [employer] Electronically Signed: By: GZetta BillsM.D. On: 06/23/2021 18:42   MR ABDOMEN MRCP W WO CONTAST  Addendum Date: 06/23/2021   ADDENDUM REPORT: 06/23/2021 18:50 ADDENDUM: Again no MRI findings of acute cholecystitis. If there are concerning physical exam findings could consider HIDA scan for further assessment as warranted. Variant biliary ductal anatomy was discussed with the ordering provider as outlined below. These results were called by telephone at the time of interpretation on 06/23/2021 at 6:50 pm [facility], who verbally acknowledged these results. Electronically Signed   By: GZetta BillsM.D.   On: 06/23/2021 18:50   Result Date: 06/23/2021 CLINICAL DATA:  RIGHT upper quadrant abdominal pain in a 60year old male. Ultrasound equivocal for CBD stone. EXAM: MRI ABDOMEN WITHOUT AND WITH CONTRAST (INCLUDING MRCP) TECHNIQUE: Multiplanar multisequence MR imaging of the abdomen was performed both before and after the administration of intravenous contrast. Heavily T2-weighted images of the biliary and pancreatic ducts were obtained, and three-dimensional MRCP images were rendered by post processing. CONTRAST:  7.526mGADAVIST GADOBUTROL 1 MMOL/ML IV SOLN COMPARISON:  Ultrasound evaluation of the same date. FINDINGS: Lower chest: Lung bases are grossly clear. Limited assessment on MRI. Hepatobiliary: MRCP sequences are markedly limited by respiratory motion. There are numerous gallstones in the dependent gallbladder in the  fundus and midportion of the gallbladder. No pericholecystic stranding, wall thickening or hyperenhancement of the gallbladder wall. Biliary anatomy is difficult to assess though there is suggestion of low insertion of a posterior division RIGHT hepatic duct upon the common hepatic duct just above the cystic duct confluence. No filling defect in  the common bile duct to the extent that it can be evaluated on MRCP sequences and axial MR images which are limited by respiratory motion. No focal, suspicious hepatic lesion. Images compromised by motion as outlined above. Portal vein is patent. Hepatic veins are patent. Pancreas: Normal intrinsic T1 signal. No ductal dilation or sign of inflammation. No focal lesion. Spleen:  Normal spleen. Adrenals/Urinary Tract: Adrenal glands are normal. No focal, suspicious renal lesion. No hydronephrosis. No perinephric stranding. Stomach/Bowel: No acute gastrointestinal process to the extent evaluated on abdominal MRI, not performed for bowel evaluation. Vascular/Lymphatic: No pathologically enlarged lymph nodes identified. No abdominal aortic aneurysm demonstrated. Atherosclerotic irregularity of the abdominal aorta. Other:  No ascites. Musculoskeletal: No suspicious bone lesions identified. IMPRESSION: Cholelithiasis without MR evidence of acute cholecystitis. Variant biliary anatomy which may have clinical significance, posterior division RIGHT hepatic duct with low insertion hepatic duct on the common above the cystic duct. If there is plan for cholecystectomy at some point in this patient, this variant can be clinically significant based on position of this duct relative to the cystic duct. No filling defect in the common bile duct. Note that images are limited due to respiratory motion on the current study as outlined above. These results will be called to the ordering clinician or representative by the Radiologist Assistant, and communication documented in the PACS or [employer]. Electronically Signed: By: Zetta Bills M.D. On: 06/23/2021 18:42   US Abdomen Limited RUQ (LIVER/GB)  Result Date: 06/23/2021 CLINICAL DATA:  Right upper quadrant pain EXAM: ULTRASOUND ABDOMEN LIMITED RIGHT UPPER QUADRANT COMPARISON:  None. FINDINGS: Gallbladder: Small stones and sludge seen in the gallbladder. No gallbladder  wall thickening. Positive Murphy sign noted by sonographer. Common bile duct: Diameter: 6 mm Liver: No focal lesion identified. Increased parenchymal echogenicity. Portal vein is patent on color Doppler imaging with normal direction of blood flow towards the liver. Other: None. IMPRESSION: Cholelithiasis and positive Murphy's sign, findings suggest acute cholecystitis. Mildly dilated common bile duct. MRCP could be performed if there is concern for choledocholithiasis. Hepatic steatosis. Electronically Signed   By: Yetta Glassman M.D.   On: 06/23/2021 17:03    Assessment: 60 y/o male with acute onset chest/epigastric pain associated with N/V presenting to the ED. Noted to have new elevation of LFTs and slightly elevated bilirubin.  Initial imaging via u/s with mild CBD dilation, cholelithiasis and positive Murphy's sign suggesting acute cholecystitis. No gb wall thickening noted. MRCP with no evidence of acute cholecystitis. Gallstones present. Variant biliary anatomy, posterior division RIGHT hepatic duct with low insertion hepatic duct on the common above the cystic duct. May be clinically significant if cholecystectomy planned. No filling defect in CBD but images limited due to respiratory motion artifact.   Dr. Constance Haw with general surgery spoke with Dr. Michaelle Birks, Hepatobiliary.  Given anatomy, recommended cholecystectomy with someone that has more expertise in the hepatobiliary realm on an outpatient basis as long as LFTs continue to improve.  Antibiotics were discontinued yesterday as there was no evidence of cholecystitis on MRCP.   LFTs this admission: AST 447>> 249>> 267 today.  ALT 355>> 380>> 480 today.  Alk phos 97>> 96>> 154 today.  T  bili 1.4>> 0.9>> 1.2 today.  Clinically, patient is feeling much improved.  Reports resolution of abdominal pain, nausea, vomiting around midday yesterday.  Tolerated soft diet well for dinner.  Query whether patient may have passed a stone/microlithiasis.  Increasing LFTs today may be reflecting passage of microlithiasis yesterday as he continued with pain until mid day yesterday. No clear indication for ERCP at this time. Suspect LFTs will start to trend down. Hold off on additional evaluation unless LFTs bump significantly or do not improve following cholecystectomy. If patient continues to feel well today and tolerates his diet, anticipate discharge. LFTs will need to continue to be monitored closely as an outpatient.  Case was discussed with Dr. Constance Haw again today.  Dr. Constance Haw is planning for follow-up in the office on Monday with repeat labs.  Dr. Constance Haw also spoke with Dr. Zenia Resides again.  Dr. Zenia Resides plans to see patient on 9/28 at 1045.  Plan: If patient tolerates his diet well today without any recurrent abdominal pain, nausea, or vomiting, agree with discharging home with follow-up in the office with Dr. Constance Haw on Monday with repeat labs. Continue supportive measures. Continue to trend LFTs. Hold off on extensive serologic evaluation of LFTs elevation unless significant bump in LFTs or if LFTs do not normalize following cholecystectomy. Keep appointment to see Dr. Zenia Resides on 9/28 for consult for cholecystectomy. Colonoscopy at a later date, overdue with FH colon cancer, brother at age 39.    LOS: 2 days    06/25/2021, 7:31 AM   Aliene Altes, PA-C [facility]

## 2021-06-25 NOTE — Progress Notes (Addendum)
[facility] Progress Note     Subjective: Tolerated diet and no nausea, pain resolved. Wanting to go home and follow up as outpatient. LFTs up slightly but nothing major.   Objective: Vital signs in last 24 hours: Temp:  [98.2 F (36.8 C)-99.1 F (37.3 C)] 98.8 F (37.1 C) (09/16 0508) Pulse Rate:  [29-93] 81 (09/16 0508) Resp:  [12-28] 18 (09/16 0508) BP: (96-123)/(50-77) 101/77 (09/16 0508) SpO2:  [88 %-98 %] 94 % (09/16 0508)    Intake/Output from previous day: 09/15 0701 - 09/16 0700 In: 458.9 [I.V.:408.9; IV Piggyback:50] Out: -  Intake/Output this shift: Total I/O In: 371.1 [I.V.:371.1] Out: -   General appearance: alert and no distress Resp: normal work of breathing GI: soft, non-tender; bowel sounds normal; no masses,  no organomegaly  Lab Results:  Recent Labs    06/23/21 1355 06/24/21 0421 06/24/21 2235  WBC 7.9 5.9  --   HGB 18.1* 15.4 14.8  HCT 56.1* 47.8 45.3  PLT 246 227  --    BMET Recent Labs    06/24/21 0421 06/25/21 0609  NA 138 140  K 3.7 4.1  CL 107 108  CO2 27 27  GLUCOSE 122* 101*  BUN 9 7*  CREATININE 0.76 0.91  CALCIUM 8.3* 8.5*   PT/INR No results for input(s): LABPROT, INR in the last 72 hours.  Studies/Results: DG Chest 2 View  Result Date: 06/23/2021 CLINICAL DATA:  Chest pain. EXAM: CHEST - 2 VIEW COMPARISON:  November 24, 2006. FINDINGS: The heart size and mediastinal contours are within normal limits. Both lungs are clear. The visualized skeletal structures are unremarkable. IMPRESSION: No active cardiopulmonary disease. Electronically Signed   By: Marijo Conception M.D.   On: 06/23/2021 14:16   MR 3D Recon At Scanner  Addendum Date: 06/23/2021   ADDENDUM REPORT: 06/23/2021 18:50 ADDENDUM: Again no MRI findings of acute cholecystitis. If there are concerning physical exam findings could consider HIDA scan for further assessment as warranted. Variant biliary ductal anatomy was discussed with the ordering  provider as outlined below. These results were called by telephone at the time of interpretation on 06/23/2021 at 6:50 pm to provider [facility] , who verbally acknowledged these results. Electronically Signed   By: Zetta Bills M.D.   On: 06/23/2021 18:50   Result Date: 06/23/2021 CLINICAL DATA:  RIGHT upper quadrant abdominal pain in a 60 year old male. Ultrasound equivocal for CBD stone. EXAM: MRI ABDOMEN WITHOUT AND WITH CONTRAST (INCLUDING MRCP) TECHNIQUE: Multiplanar multisequence MR imaging of the abdomen was performed both before and after the administration of intravenous contrast. Heavily T2-weighted images of the biliary and pancreatic ducts were obtained, and three-dimensional MRCP images were rendered by post processing. CONTRAST:  7.90m GADAVIST GADOBUTROL 1 MMOL/ML IV SOLN COMPARISON:  Ultrasound evaluation of the same date. FINDINGS: Lower chest: Lung bases are grossly clear. Limited assessment on MRI. Hepatobiliary: MRCP sequences are markedly limited by respiratory motion. There are numerous gallstones in the dependent gallbladder in the fundus and midportion of the gallbladder. No pericholecystic stranding, wall thickening or hyperenhancement of the gallbladder wall. Biliary anatomy is difficult to assess though there is suggestion of low insertion of a posterior division RIGHT hepatic duct upon the common hepatic duct just above the cystic duct confluence. No filling defect in the common bile duct to the extent that it can be evaluated on MRCP sequences and axial MR images which are limited by respiratory motion. No focal, suspicious hepatic lesion. Images compromised by motion as  outlined above. Portal vein is patent. Hepatic veins are patent. Pancreas: Normal intrinsic T1 signal. No ductal dilation or sign of inflammation. No focal lesion. Spleen:  Normal spleen. Adrenals/Urinary Tract: Adrenal glands are normal. No focal, suspicious renal lesion. No hydronephrosis. No perinephric  stranding. Stomach/Bowel: No acute gastrointestinal process to the extent evaluated on abdominal MRI, not performed for bowel evaluation. Vascular/Lymphatic: No pathologically enlarged lymph nodes identified. No abdominal aortic aneurysm demonstrated. Atherosclerotic irregularity of the abdominal aorta. Other:  No ascites. Musculoskeletal: No suspicious bone lesions identified. IMPRESSION: Cholelithiasis without MR evidence of acute cholecystitis. Variant biliary anatomy which may have clinical significance, posterior division RIGHT hepatic duct with low insertion hepatic duct on the common above the cystic duct. If there is plan for cholecystectomy at some point in this patient, this variant can be clinically significant based on position of this duct relative to the cystic duct. No filling defect in the common bile duct. Note that images are limited due to respiratory motion on the current study as outlined above. These results will be called to the ordering clinician or representative by the Radiologist Assistant, and communication documented in the PACS or [employer]. Electronically Signed: By: Zetta Bills M.D. On: 06/23/2021 18:42   MR ABDOMEN MRCP W WO CONTAST  Addendum Date: 06/23/2021   ADDENDUM REPORT: 06/23/2021 18:50 ADDENDUM: Again no MRI findings of acute cholecystitis. If there are concerning physical exam findings could consider HIDA scan for further assessment as warranted. Variant biliary ductal anatomy was discussed with the ordering provider as outlined below. These results were called by telephone at the time of interpretation on 06/23/2021 at 6:50 pm to provider [facility] , who verbally acknowledged these results. Electronically Signed   By: Zetta Bills M.D.   On: 06/23/2021 18:50   Result Date: 06/23/2021 CLINICAL DATA:  RIGHT upper quadrant abdominal pain in a 60 year old male. Ultrasound equivocal for CBD stone. EXAM: MRI ABDOMEN WITHOUT AND WITH CONTRAST (INCLUDING  MRCP) TECHNIQUE: Multiplanar multisequence MR imaging of the abdomen was performed both before and after the administration of intravenous contrast. Heavily T2-weighted images of the biliary and pancreatic ducts were obtained, and three-dimensional MRCP images were rendered by post processing. CONTRAST:  7.73m GADAVIST GADOBUTROL 1 MMOL/ML IV SOLN COMPARISON:  Ultrasound evaluation of the same date. FINDINGS: Lower chest: Lung bases are grossly clear. Limited assessment on MRI. Hepatobiliary: MRCP sequences are markedly limited by respiratory motion. There are numerous gallstones in the dependent gallbladder in the fundus and midportion of the gallbladder. No pericholecystic stranding, wall thickening or hyperenhancement of the gallbladder wall. Biliary anatomy is difficult to assess though there is suggestion of low insertion of a posterior division RIGHT hepatic duct upon the common hepatic duct just above the cystic duct confluence. No filling defect in the common bile duct to the extent that it can be evaluated on MRCP sequences and axial MR images which are limited by respiratory motion. No focal, suspicious hepatic lesion. Images compromised by motion as outlined above. Portal vein is patent. Hepatic veins are patent. Pancreas: Normal intrinsic T1 signal. No ductal dilation or sign of inflammation. No focal lesion. Spleen:  Normal spleen. Adrenals/Urinary Tract: Adrenal glands are normal. No focal, suspicious renal lesion. No hydronephrosis. No perinephric stranding. Stomach/Bowel: No acute gastrointestinal process to the extent evaluated on abdominal MRI, not performed for bowel evaluation. Vascular/Lymphatic: No pathologically enlarged lymph nodes identified. No abdominal aortic aneurysm demonstrated. Atherosclerotic irregularity of the abdominal aorta. Other:  No ascites. Musculoskeletal: No suspicious bone  lesions identified. IMPRESSION: Cholelithiasis without MR evidence of acute cholecystitis. Variant  biliary anatomy which may have clinical significance, posterior division RIGHT hepatic duct with low insertion hepatic duct on the common above the cystic duct. If there is plan for cholecystectomy at some point in this patient, this variant can be clinically significant based on position of this duct relative to the cystic duct. No filling defect in the common bile duct. Note that images are limited due to respiratory motion on the current study as outlined above. These results will be called to the ordering clinician or representative by the Radiologist Assistant, and communication documented in the PACS or [employer]. Electronically Signed: By: Zetta Bills M.D. On: 06/23/2021 18:42   US Abdomen Limited RUQ (LIVER/GB)  Result Date: 06/23/2021 CLINICAL DATA:  Right upper quadrant pain EXAM: ULTRASOUND ABDOMEN LIMITED RIGHT UPPER QUADRANT COMPARISON:  None. FINDINGS: Gallbladder: Small stones and sludge seen in the gallbladder. No gallbladder wall thickening. Positive Murphy sign noted by sonographer. Common bile duct: Diameter: 6 mm Liver: No focal lesion identified. Increased parenchymal echogenicity. Portal vein is patent on color Doppler imaging with normal direction of blood flow towards the liver. Other: None. IMPRESSION: Cholelithiasis and positive Murphy's sign, findings suggest acute cholecystitis. Mildly dilated common bile duct. MRCP could be performed if there is concern for choledocholithiasis. Hepatic steatosis. Electronically Signed   By: Yetta Glassman M.D.   On: 06/23/2021 17:03    Anti-infectives: Anti-infectives (From admission, onward)    Start     Dose/Rate Route Frequency Ordered Stop   06/24/21 0000  cefTRIAXone (ROCEPHIN) 1 g in sodium chloride 0.9 % 100 mL IVPB  Status:  Discontinued        1 g 200 mL/hr over 30 Minutes Intravenous Every 24 hours 06/23/21 1801 06/23/21 1805   06/23/21 1815  metroNIDAZOLE (FLAGYL) IVPB 500 mg  Status:  Discontinued        500 mg 100  mL/hr over 60 Minutes Intravenous Every 8 hours 06/23/21 1801 06/24/21 1240   06/23/21 1815  cefTRIAXone (ROCEPHIN) 2 g in sodium chloride 0.9 % 100 mL IVPB  Status:  Discontinued        2 g 200 mL/hr over 30 Minutes Intravenous Every 24 hours 06/23/21 1807 06/24/21 1240   06/23/21 1806  cefTRIAXone (ROCEPHIN) 2 g in sodium chloride 0.9 % 100 mL IVPB  Status:  Discontinued        2 g 200 mL/hr over 30 Minutes Intravenous Every 24 hours 06/23/21 1806 06/23/21 1807   06/23/21 1730  cefTRIAXone (ROCEPHIN) 2 g in sodium chloride 0.9 % 100 mL IVPB  Status:  Discontinued        2 g 200 mL/hr over 30 Minutes Intravenous  Once 06/23/21 1724 06/23/21 1806       Assessment/Plan: Patient with cholelithiasis and potential choledocholithiasis that resolved spontaneously with aberrant anatomy on MRCP. Dr. Zenia Resides will see to get his  gallbladder removed. He is overall improving clinically. I have discussed with the team and the patient/wife a plan to get him out with labs Monday and a phone call to ensure no changes or worsening.    Lab work on Monday 06/28/2021 at [facility]. Orders in place.  Dr. Constance Haw will call you and talk to you on Monday to make sure you are still improving.   If you have worsening pain, nausea, vomiting, worsening lab work you may have to go back to the hospital to get admitted for cholecystectomy before you see Dr.  Zenia Resides in her clinic on 07/07/2021 @ 10:45AM (arrive at 10:15AM).   Curlene Labrum, MD [facility] [address] [place], [place] [address] [phone] (office)

## 2021-06-25 NOTE — Discharge Instructions (Signed)
Lab work on Monday 06/28/2021 at [facility]. Orders in place.  Dr. Constance Haw will call you and talk to you on Monday to make sure you are still improving.  If you have worsening pain, nausea, vomiting, worsening lab work you may have to go back to the hospital to get admitted for cholecystectomy before you see Dr. Zenia Resides in her clinic on 07/07/2021 @ 10:45AM (arrive at 10:15AM).

## 2021-06-25 NOTE — Discharge Summary (Signed)
Physician Discharge Summary  Robert Yates [id number] DOB: 23-Feb-1961 DOA: 06/23/2021  PCP: Robert Ards, NP  Admit date: 06/23/2021 Discharge date: 06/25/2021  Time spent: 35 minutes  Recommendations for Outpatient Follow-up:  1-Repeat complete metabolic panel to follow electrolytes, renal function and LFTs. 2-continue assisting with tobacco cessation. 3-outpatient follow-up with general surgery.  Discharge Diagnoses:  Active Problems:   Acute cholecystitis   Cholecystitis   RUQ pain   Elevated LFTs   Calculus of gallbladder with biliary obstruction but without cholecystitis   Calculus of gallbladder without cholecystitis without obstruction   Tobacco abuse   Discharge Condition: Stable and improved; discharged home with instruction to follow-up with general surgery and PCP.  She as needed antiemetics, analgesics provided.  CODE STATUS: Full code.  Diet recommendation: Heart healthy/low sodium low-fat diet.  Filed Weights   06/23/21 1332  Weight: 78.9 kg    History of present illness:  As per H&P written by Dr. Roosevelt Yates on 06/23/21 Robert Yates is a 60 y.o. male with medical history significant of COPD, chronic palpitations, cigarette smoker, presented with new onset of epigastric pain and chest pain.  Symptoms started this morning after eating breakfast, patient started to experience severe cramping like epigastric pain radiated to the chest and right shoulder, worsening with deep breath, has episode of chills but no fever, feeling nauseous but no vomiting.  He called ambulance but when ambulance came he symptoms relieved.  But later the pain came back again and became persistent on the right upper quadrant, worsening with deep breath and movement and he called EMS again.   ED Course: Troponin negative x2, EKG showed no acute ST changes.  Blood work showed WBC 6.0, elevated LFTs AST 400 and ALT 350, CT abdomen showed signs of acute cholecystitis and mild dilation of  CBD.  Hospital Course:  1-Acute cholelithiasis and biliary colic. -Still having intermittent nausea/vomiting right upper quadrant pain. -Patient is afebrile, normal WBCs and demonstrating no signs of cholecystitis on MRCP. -Patient has tolerated diet with mild discomfort; ok to discharge home with outpatient follow up with general surgery -follow LFT's -continue to follow PRN analgesics, antiemetics and low fat diet. -LFTs stable   2-transaminitis -As mentioned above LFTs has remained stable/starting to trend down. -Appears to be associated with the passage of a gallstones -MRCP ruling out obstruction or significant dilatation of the bile duct. -No indication for ERCP at this time.   3-tobacco abuse -Cessation counseling provided -Continue the use of nicotine patch.   4-COPD -No wheezing, no shortness of breath -Stable overall. -Continue bronchodilators.   5-PVC's/trigeminy  -Good response to the use of Lopressor -TSH within normal limit. -Continue b-blocker at discharge.   6-grade 1 diastolic dysfunction -Chronic and compensated -Continue to follow daily weights and follow low sodium diet.   Procedures: See below for x-ray report.  Consultations: General surgery  Discharge Exam: Vitals:   06/24/21 2101 06/25/21 0508  BP: 117/74 101/77  Pulse: 83 81  Resp: 18 18  Temp: 98.4 F (36.9 C) 98.8 F (37.1 C)  SpO2: 97% 94%    General: Patient reports no nausea or vomiting; still with mild to moderate intermittent right upper quadrant pain. Tolerating diet; no fever. Cardiovascular: S1 and S2, no rubs, no gallops, no JVD. Respiratory: Clear to auscultation bilaterally; no using accessory muscle.  No requiring oxygen supplementation. Abd: Mild right upper quadrant discomfort on palpation; no nausea or vomiting reported.  Positive bowel sounds appreciated.  No guarding Extremities: No cyanosis  or clubbing.  Discharge Instructions   Discharge Instructions      Discharge instructions   Complete by: As directed    Take medications as prescribed Maintain adequate hydration Follow-up with general surgery on 06/28/2021 as instructed. Follow low-fat diet easy to digest food.      Allergies as of 06/25/2021       Reactions   Sulfa Antibiotics Rash        Medication List     STOP taking these medications    Advil PM 200-25 MG Caps Generic drug: Ibuprofen-diphenhydrAMINE HCl   ibuprofen 400 MG tablet Commonly known as: ADVIL   Melatonin 10 MG Caps   VITAMIN D PO       TAKE these medications    acetaminophen 500 MG tablet Commonly known as: TYLENOL Take 1,000 mg by mouth every 6 (six) hours as needed.   fluticasone furoate-vilanterol 100-25 MCG/INH Aepb Commonly known as: Breo Ellipta Inhale 1 puff into the lungs every morning. What changed: Another medication with the same name was removed. Continue taking this medication, and follow the directions you see here.   metoprolol tartrate 25 MG tablet Commonly known as: LOPRESSOR Take 0.5 tablets (12.5 mg total) by mouth 2 (two) times daily.   nicotine 21 mg/24hr patch Commonly known as: NICODERM CQ - dosed in mg/24 hours Place 1 patch (21 mg total) onto the skin daily. Start taking on: June 26, 2021   oxyCODONE 5 MG immediate release tablet Commonly known as: Roxicodone Take 1 tablet (5 mg total) by mouth every 6 (six) hours as needed for severe pain.   promethazine 25 MG tablet Commonly known as: PHENERGAN Take 1 tablet (25 mg total) by mouth every 6 (six) hours as needed for nausea or vomiting.   promethazine 25 MG suppository Commonly known as: PHENERGAN Place 1 suppository (25 mg total) rectally every 6 (six) hours as needed for refractory nausea / vomiting.   vitamin B-12 1000 MCG tablet Commonly known as: CYANOCOBALAMIN Take 1,000 mcg by mouth daily.       Allergies  Allergen Reactions   Sulfa Antibiotics Rash    Follow-up Information     Robert Bolt, MD Follow up on 07/07/2021.   Specialty: General Surgery Why: Arrive at 10:15AM for your appointment at 10:45AM. Contact information: [place] [place] [address] [phone]         Robert Cagey, MD Follow up on 06/28/2021.   Specialty: General Surgery Why: Dr. Constance Haw will call you on Monday to make sure you are feeling ok and get labwork Monday. This labwork can be obtained at [facility] (across from the [facility]). [place], [place], [place] [address]. The orders will be in for them to see. Go anytime on Monday 7AM-12PM, 1PM-4PM. Contact information: [address] Linna Hoff [facility] [address] [place], Sarah E, NP. Schedule an appointment as soon as possible for a visit in 2 week(s).   Specialty: Nurse Practitioner Contact information: [place] [address] [phone]         Minus Breeding, MD .   Specialty: Cardiology Contact information: [place] [address] [phone]                 The results of significant diagnostics from this hospitalization (including imaging, microbiology, ancillary and laboratory) are listed below for reference.    Significant Diagnostic Studies: DG Chest  2 View  Result Date: 06/23/2021 CLINICAL DATA:  Chest pain. EXAM: CHEST - 2 VIEW COMPARISON:  November 24, 2006. FINDINGS: The heart size and mediastinal contours are within normal limits. Both lungs are clear. The visualized skeletal structures are unremarkable. IMPRESSION: No active cardiopulmonary disease. Electronically Signed   By: Marijo Conception M.D.   On: 06/23/2021 14:16   MR 3D Recon At Scanner  Addendum Date: 06/23/2021   ADDENDUM REPORT: 06/23/2021 18:50 ADDENDUM: Again no MRI findings of acute cholecystitis. If there are concerning physical exam findings could consider HIDA scan for further assessment as warranted. Variant biliary ductal anatomy was discussed with the  ordering provider as outlined below. These results were called by telephone at the time of interpretation on 06/23/2021 at 6:50 pm to provider [facility] , who verbally acknowledged these results. Electronically Signed   By: Zetta Bills M.D.   On: 06/23/2021 18:50   Result Date: 06/23/2021 CLINICAL DATA:  RIGHT upper quadrant abdominal pain in a 60 year old male. Ultrasound equivocal for CBD stone. EXAM: MRI ABDOMEN WITHOUT AND WITH CONTRAST (INCLUDING MRCP) TECHNIQUE: Multiplanar multisequence MR imaging of the abdomen was performed both before and after the administration of intravenous contrast. Heavily T2-weighted images of the biliary and pancreatic ducts were obtained, and three-dimensional MRCP images were rendered by post processing. CONTRAST:  7.3m GADAVIST GADOBUTROL 1 MMOL/ML IV SOLN COMPARISON:  Ultrasound evaluation of the same date. FINDINGS: Lower chest: Lung bases are grossly clear. Limited assessment on MRI. Hepatobiliary: MRCP sequences are markedly limited by respiratory motion. There are numerous gallstones in the dependent gallbladder in the fundus and midportion of the gallbladder. No pericholecystic stranding, wall thickening or hyperenhancement of the gallbladder wall. Biliary anatomy is difficult to assess though there is suggestion of low insertion of a posterior division RIGHT hepatic duct upon the common hepatic duct just above the cystic duct confluence. No filling defect in the common bile duct to the extent that it can be evaluated on MRCP sequences and axial MR images which are limited by respiratory motion. No focal, suspicious hepatic lesion. Images compromised by motion as outlined above. Portal vein is patent. Hepatic veins are patent. Pancreas: Normal intrinsic T1 signal. No ductal dilation or sign of inflammation. No focal lesion. Spleen:  Normal spleen. Adrenals/Urinary Tract: Adrenal glands are normal. No focal, suspicious renal lesion. No hydronephrosis. No  perinephric stranding. Stomach/Bowel: No acute gastrointestinal process to the extent evaluated on abdominal MRI, not performed for bowel evaluation. Vascular/Lymphatic: No pathologically enlarged lymph nodes identified. No abdominal aortic aneurysm demonstrated. Atherosclerotic irregularity of the abdominal aorta. Other:  No ascites. Musculoskeletal: No suspicious bone lesions identified. IMPRESSION: Cholelithiasis without MR evidence of acute cholecystitis. Variant biliary anatomy which may have clinical significance, posterior division RIGHT hepatic duct with low insertion hepatic duct on the common above the cystic duct. If there is plan for cholecystectomy at some point in this patient, this variant can be clinically significant based on position of this duct relative to the cystic duct. No filling defect in the common bile duct. Note that images are limited due to respiratory motion on the current study as outlined above. These results will be called to the ordering clinician or representative by the Radiologist Assistant, and communication documented in the PACS or [employer] Electronically Signed: By: GZetta BillsM.D. On: 06/23/2021 18:42   MR ABDOMEN MRCP W WO CONTAST  Addendum Date: 06/23/2021   ADDENDUM REPORT: 06/23/2021 18:50 ADDENDUM: Again no MRI findings of acute cholecystitis. If  there are concerning physical exam findings could consider HIDA scan for further assessment as warranted. Variant biliary ductal anatomy was discussed with the ordering provider as outlined below. These results were called by telephone at the time of interpretation on 06/23/2021 at 6:50 pm to provider [facility] , who verbally acknowledged these results. Electronically Signed   By: Zetta Bills M.D.   On: 06/23/2021 18:50   Result Date: 06/23/2021 CLINICAL DATA:  RIGHT upper quadrant abdominal pain in a 60 year old male. Ultrasound equivocal for CBD stone. EXAM: MRI ABDOMEN WITHOUT AND WITH CONTRAST  (INCLUDING MRCP) TECHNIQUE: Multiplanar multisequence MR imaging of the abdomen was performed both before and after the administration of intravenous contrast. Heavily T2-weighted images of the biliary and pancreatic ducts were obtained, and three-dimensional MRCP images were rendered by post processing. CONTRAST:  7.53m GADAVIST GADOBUTROL 1 MMOL/ML IV SOLN COMPARISON:  Ultrasound evaluation of the same date. FINDINGS: Lower chest: Lung bases are grossly clear. Limited assessment on MRI. Hepatobiliary: MRCP sequences are markedly limited by respiratory motion. There are numerous gallstones in the dependent gallbladder in the fundus and midportion of the gallbladder. No pericholecystic stranding, wall thickening or hyperenhancement of the gallbladder wall. Biliary anatomy is difficult to assess though there is suggestion of low insertion of a posterior division RIGHT hepatic duct upon the common hepatic duct just above the cystic duct confluence. No filling defect in the common bile duct to the extent that it can be evaluated on MRCP sequences and axial MR images which are limited by respiratory motion. No focal, suspicious hepatic lesion. Images compromised by motion as outlined above. Portal vein is patent. Hepatic veins are patent. Pancreas: Normal intrinsic T1 signal. No ductal dilation or sign of inflammation. No focal lesion. Spleen:  Normal spleen. Adrenals/Urinary Tract: Adrenal glands are normal. No focal, suspicious renal lesion. No hydronephrosis. No perinephric stranding. Stomach/Bowel: No acute gastrointestinal process to the extent evaluated on abdominal MRI, not performed for bowel evaluation. Vascular/Lymphatic: No pathologically enlarged lymph nodes identified. No abdominal aortic aneurysm demonstrated. Atherosclerotic irregularity of the abdominal aorta. Other:  No ascites. Musculoskeletal: No suspicious bone lesions identified. IMPRESSION: Cholelithiasis without MR evidence of acute cholecystitis.  Variant biliary anatomy which may have clinical significance, posterior division RIGHT hepatic duct with low insertion hepatic duct on the common above the cystic duct. If there is plan for cholecystectomy at some point in this patient, this variant can be clinically significant based on position of this duct relative to the cystic duct. No filling defect in the common bile duct. Note that images are limited due to respiratory motion on the current study as outlined above. These results will be called to the ordering clinician or representative by the Radiologist Assistant, and communication documented in the PACS or [employer] Electronically Signed: By: GZetta BillsM.D. On: 06/23/2021 18:42   UK[place]Abdomen Limited RUQ (LIVER/GB)  Result Date: 06/23/2021 CLINICAL DATA:  Right upper quadrant pain EXAM: ULTRASOUND ABDOMEN LIMITED RIGHT UPPER QUADRANT COMPARISON:  None. FINDINGS: Gallbladder: Small stones and sludge seen in the gallbladder. No gallbladder wall thickening. Positive Murphy sign noted by sonographer. Common bile duct: Diameter: 6 mm Liver: No focal lesion identified. Increased parenchymal echogenicity. Portal vein is patent on color Doppler imaging with normal direction of blood flow towards the liver. Other: None. IMPRESSION: Cholelithiasis and positive Murphy's sign, findings suggest acute cholecystitis. Mildly dilated common bile duct. MRCP could be performed if there is concern for choledocholithiasis. Hepatic steatosis. Electronically Signed   By: LDenny Peon  Strickland M.D.   On: 06/23/2021 17:03    Microbiology: Recent Results (from the past 240 hour(s))  SARS CORONAVIRUS 2 (TAT 6-24 HRS) Nasopharyngeal Nasopharyngeal Swab     Status: None   Collection Time: 06/23/21  8:03 PM   Specimen: Nasopharyngeal Swab  Result Value Ref Range Status   SARS Coronavirus 2 NEGATIVE NEGATIVE Final    Comment: (NOTE) SARS-CoV-2 target nucleic acids are NOT DETECTED.  The SARS-CoV-2 RNA is generally  detectable in upper and lower respiratory specimens during the acute phase of infection. Negative results do not preclude SARS-CoV-2 infection, do not rule out co-infections with other pathogens, and should not be used as the sole basis for treatment or other patient management decisions. Negative results must be combined with clinical observations, patient history, and epidemiological information. The expected result is Negative.  Fact Sheet for Patients: SugarRoll.be  Fact Sheet for Healthcare Providers: https://www.woods-mathews.com/  This test is not yet approved or cleared by the [place] FDA and  has been authorized for detection and/or diagnosis of SARS-CoV-2 by FDA under an Emergency Use Authorization (EUA). This EUA will remain  in effect (meaning this test can be used) for the duration of the COVID-19 declaration under Se ction 564(b)(1) of the Act, 21 U.S.C. section 360bbb-3(b)(1), unless the authorization is terminated or revoked sooner.  Performed at [facility], [place] [address] [place], [place] [address]      Labs: Basic Metabolic Panel: Recent Labs  Lab 06/23/21 1355 06/23/21 1600 06/24/21 0421 06/25/21 0609  NA 138  --  138 140  K 4.3  --  3.7 4.1  CL 104  --  107 108  CO2 25  --  27 27  GLUCOSE 108*  --  122* 101*  BUN 10  --  9 7*  CREATININE 0.76  --  0.76 0.91  CALCIUM 9.6  --  8.3* 8.5*  MG  --  2.3  --   --    Liver Function Tests: Recent Labs  Lab 06/23/21 1355 06/24/21 0421 06/25/21 0609  AST 447* 249* 267*  ALT 355* 380* 480*  ALKPHOS 97 96 154*  BILITOT 1.4* 0.9 1.2  PROT 7.6 5.9* 5.8*  ALBUMIN 4.5 3.5 3.3*   Recent Labs  Lab 06/23/21 1355  LIPASE 39    CBC: Recent Labs  Lab 06/23/21 1355 06/24/21 0421 06/24/21 2235  WBC 7.9 5.9  --   NEUTROABS  --  4.4  --   HGB 18.1* 15.4 14.8  HCT 56.1* 47.8 45.3  MCV 92.7 92.1  --   PLT 246 227  --     Signed:  Barton Dubois MD.  [facility] 06/25/2021, 4:14 PM

## 2021-06-28 ENCOUNTER — Other Ambulatory Visit: Payer: Self-pay | Admitting: Family Medicine

## 2021-06-28 ENCOUNTER — Telehealth (INDEPENDENT_AMBULATORY_CARE_PROVIDER_SITE_OTHER): Payer: 59 | Admitting: General Surgery

## 2021-06-28 DIAGNOSIS — R7989 Other specified abnormal findings of blood chemistry: Secondary | ICD-10-CM

## 2021-06-28 NOTE — Progress Notes (Signed)
Hepatic function panel

## 2021-06-28 NOTE — Telephone Encounter (Signed)
[facility]  Patient says he has no pain and no fever. He got his LFTs this AM. They have not resulted. He has been eating but not eating heavy meals.   Will call him tomorrow about labs.   He reports have a nose bleed and was worried about lovenox he received in the hospital. Told him this would have already been out of his system. The nose bleed is likely just from continued irritation of the area. Encouraged ice, pinching the nose, etc.   He has follow up with Dr. Zenia Resides next week.   Curlene Labrum, MD [facility] [address] [place], [place] [address] [phone] (office)

## 2021-06-29 ENCOUNTER — Encounter: Payer: Self-pay | Admitting: Internal Medicine

## 2021-06-29 ENCOUNTER — Telehealth (INDEPENDENT_AMBULATORY_CARE_PROVIDER_SITE_OTHER): Payer: 59 | Admitting: General Surgery

## 2021-06-29 ENCOUNTER — Telehealth: Payer: Self-pay | Admitting: Gastroenterology

## 2021-06-29 DIAGNOSIS — Q445 Other congenital malformations of bile ducts: Secondary | ICD-10-CM

## 2021-06-29 DIAGNOSIS — K8020 Calculus of gallbladder without cholecystitis without obstruction: Secondary | ICD-10-CM

## 2021-06-29 DIAGNOSIS — R7989 Other specified abnormal findings of blood chemistry: Secondary | ICD-10-CM

## 2021-06-29 HISTORY — DX: Other congenital malformations of bile ducts: Q44.5

## 2021-06-29 LAB — HEPATIC FUNCTION PANEL
AG Ratio: 1.8 (calc) (ref 1.0–2.5)
ALT: 262 U/L — ABNORMAL HIGH (ref 9–46)
AST: 87 U/L — ABNORMAL HIGH (ref 10–35)
Albumin: 4 g/dL (ref 3.6–5.1)
Alkaline phosphatase (APISO): 148 U/L — ABNORMAL HIGH (ref 35–144)
Bilirubin, Direct: 0.2 mg/dL (ref 0.0–0.2)
Globulin: 2.2 g/dL (calc) (ref 1.9–3.7)
Indirect Bilirubin: 0.5 mg/dL (calc) (ref 0.2–1.2)
Total Bilirubin: 0.7 mg/dL (ref 0.2–1.2)
Total Protein: 6.2 g/dL (ref 6.1–8.1)

## 2021-06-29 NOTE — Telephone Encounter (Signed)
Please arrange hospital follow-up in 3-4 weeks with an APP or Dr. Gala Romney. Dx: Elevated LFTs.

## 2021-06-29 NOTE — Telephone Encounter (Signed)
[facility]  Results for JEFFERIE, HOLSTON (MRN [id number]) as of 06/29/2021 17:02  Ref. Range 06/25/2021 06:09 06/28/2021 13:41  COMPREHENSIVE METABOLIC PANEL Unknown Rpt (A)   Sodium Latest Ref Range: 135 - 145 mmol/L 140   Potassium Latest Ref Range: 3.5 - 5.1 mmol/L 4.1   Chloride Latest Ref Range: 98 - 111 mmol/L 108   CO2 Latest Ref Range: 22 - 32 mmol/L 27   Glucose Latest Ref Range: 70 - 99 mg/dL 101 (H)   BUN Latest Ref Range: 8 - 23 mg/dL 7 (L)   Creatinine Latest Ref Range: 0.61 - 1.24 mg/dL 0.91   Calcium Latest Ref Range: 8.9 - 10.3 mg/dL 8.5 (L)   Anion gap Latest Ref Range: 5 - 15  5   Alkaline Phosphatase Latest Ref Range: 38 - 126 U/L 154 (H)   Albumin Latest Ref Range: 3.5 - 5.0 g/dL 3.3 (L)   AG Ratio Latest Ref Range: 1.0 - 2.5 (calc)  1.8  AST Latest Ref Range: 10 - 35 U/L 267 (H) 87 (H)  ALT Latest Ref Range: 9 - 46 U/L 480 (H) 262 (H)  Total Protein Latest Ref Range: 6.1 - 8.1 g/dL 5.8 (L) 6.2  Bilirubin, Direct Latest Ref Range: 0.0 - 0.2 mg/dL  0.2  Indirect Bilirubin Latest Ref Range: 0.2 - 1.2 mg/dL (calc)  0.5  Total Bilirubin Latest Ref Range: 0.2 - 1.2 mg/dL 1.2 0.7  GFR, Estimated Latest Ref Range: >60 mL/min >60   Alkaline phosphatase (APISO) Latest Ref Range: 35 - 144 U/L  148 (H)  Globulin Latest Ref Range: 1.9 - 3.7 g/dL (calc)  2.2    LFTs all heading in the right direction. Patient still doing better. Was admitted with elevated Lfts and insurance is refusing his admission since he did not have surgery that admission.   He is going to bring his papers to the clinic for me to fill out explaining why he was kept and why he did not have surgery at that time- aberrant anatomy.   He see Dr. Zenia Resides next week.  Curlene Labrum, MD [facility] [address] [place], [place] [address] [phone] (office)

## 2021-06-30 ENCOUNTER — Encounter (INDEPENDENT_AMBULATORY_CARE_PROVIDER_SITE_OTHER): Payer: 59 | Admitting: Nurse Practitioner

## 2021-07-01 ENCOUNTER — Telehealth (INDEPENDENT_AMBULATORY_CARE_PROVIDER_SITE_OTHER): Payer: 59 | Admitting: General Surgery

## 2021-07-01 DIAGNOSIS — R7989 Other specified abnormal findings of blood chemistry: Secondary | ICD-10-CM

## 2021-07-01 DIAGNOSIS — K8020 Calculus of gallbladder without cholecystitis without obstruction: Secondary | ICD-10-CM

## 2021-07-01 DIAGNOSIS — Q445 Other congenital malformations of bile ducts: Secondary | ICD-10-CM

## 2021-07-01 NOTE — Telephone Encounter (Signed)
[facility] To whom it may concern,  Robert Yates. Robert Yates (DOB [birth year]) was seen in the emergency department at [facility] the afternoon of 06/23/2021 for epigastric pain, chest pain with associated nausea and diaphoresis.  He had a work up including labs and an ultrasound that demonstrated elevated liver function test and concerns for possible acute cholecystitis with an enlarged bile duct.   Given the elevated bilirubin to 1.4, AST/ ALT 447/355, I instructed the emergency department to admit him through the hospitalist service for further evaluation with an MRCP (Magnetic resonance cholangiopancreatography) to evaluate his gallbladder and biliary system for choledocholithiasis as I was afraid he had an obstructing stone.   He had his MRCP 06/23/2021 without any obvious stone or acute cholecystitis but this was limited due to respiratory motion artifact. 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 This is critical to know given the added risk of an injury to the biliary system during cholecystectomy.    I saw him on the morning of 06/24/2021, and he was still having pain and had not tolerated any diet.  In my medical opinion he had a biliary colic attack and passed a stone through his biliary system. I discussed the case with Dr. Michaelle Birks, MD who is a hepatobiliary surgeon in [place], [place] at [facility]. I discussed that I felt he would be better served having surgery with her given the aberrant anatomy, and we made a plan for him to follow up with her as an outpatient, if he was able to have improvement in pain and tolerate a diet. He remained in the hospital overnight 06/24/2021 to ensure he did this and was able to be discharged instead of being transferred for further care.  On 06/25/2021, he was discharged tolerating a diet, having adequate pain control, and his liver test were relatively stable.  He had repeat outpatient labs on 06/28/2021 with continued improvement in his liver test.   It is in my medical opinion that he required the admission due to the need to assess the lab work and his ability to tolerate oral intake. If he had not had improvement, he would have needed to be transferred and would have needed a more urgent procedure.  We could not have been sure that he was going to tolerate out outpatient plan for cholecystectomy if he did not remain at the hospital for a full evaluation. The outpatient plan with Dr. Zenia Resides for cholecystectomy is to mitigate any potential risk of injury to his biliary system, which could cause significant morbidity and cost to the patient.   Curlene Labrum, MD [facility] [address] [place], [place] [address] [phone] (office)

## 2021-07-07 ENCOUNTER — Ambulatory Visit: Payer: Self-pay | Admitting: Surgery

## 2021-07-07 DIAGNOSIS — R109 Unspecified abdominal pain: Secondary | ICD-10-CM | POA: Insufficient documentation

## 2021-07-07 NOTE — H&P (Signed)
History of Present Illness: Robert Yates is a 60 y.o. male who was referred to me for evaluation of cholelithiasis with choledocholithiasis.  About 2 weeks ago he developed an acute episode of severe upper abdominal pain radiating across the upper abdomen.  It lasted for several hours and was associated with nausea and vomiting.  He went to the ED at [facility] on 9/14, where labs were significant for elevated transaminases (AST/ALT 447/355) and a mildly elevated total bilirubin of 1.4.  Lipase was normal.  An ultrasound showed cholelithiasis.  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  He was admitted and evaluated by Dr. Constance Haw.  He was treated symptomatically for his pain and symptoms improved by the following morning.  He was able to tolerate p.o. and his LFTs down trended.  He had repeat LFTs several days after discharge, at which time his bilirubin was normal and transaminases continue to downtrend.  Due to his aberrant biliary anatomy, he was referred to me for cholecystectomy.  Today he reports he has continued to have intermittent epigastric abdominal pain, but nothing as severe as what he had when he was admitted to the hospital.  He has not had any fevers or chills.   He reports a history of a heart murmur.  He did have an echo one year ago at which time his LVEF was normal and he had no valvular abnormalities.  He also has a history of COPD for which he uses a Breo inhaler daily.  He reports he does not have any shortness of breath unless he exerts himself significantly.  He can climb a flight of stairs without difficulty.  Has not had any prior abdominal surgeries.     Review of Systems: A complete review of systems was obtained from the patient.  I have reviewed this information and discussed as appropriate with the patient.  See HPI as well  for other ROS.       Medical History: Past Medical History Past Medical History: Diagnosis Date  COPD (chronic obstructive pulmonary disease) (CMS-HCC)    GERD (gastroesophageal reflux disease)        Patient Active Problem List Diagnosis  Calculus of gallbladder without cholecystitis without obstruction  Abdominal pain     Past Surgical History Past Surgical History: Procedure Laterality Date  TONSILLECTOMY          Allergies Allergies Allergen Reactions  Sulfa (Sulfonamide Antibiotics) Rash      Current Outpatient Medications on File Prior to Visit Medication Sig Dispense Refill  BREO ELLIPTA 200-25 mcg/dose DsDv Inhale 1 inhalation into the lungs once daily      metoprolol tartrate (LOPRESSOR) 25 MG tablet Take 12.5 mg by mouth 3 (three) times daily as needed      promethazine (PHENERGAN) 25 MG suppository Place rectally      nicotine (NICODERM CQ) 21 mg/24 hr patch        oxyCODONE (ROXICODONE) 5 MG immediate release tablet        PROMETHEGAN 25 mg suppository PLACE 1 SUPPOSITORY RECTALLY EVERY SIX HOURS AS NEEDED FOR REFRACTORY NAUSES/VOMITING       No current facility-administered medications on file prior to visit.     Family History Family History Problem Relation Age of Onset  Colon cancer Brother        Social History  Tobacco Use Smoking Status Current Every Day Smoker  Packs/day: 1.00 Smokeless Tobacco Never Used     Social History Social History    Socioeconomic History  Marital status: Unknown Tobacco Use  Smoking status: Current Every Day Smoker     Packs/day: 1.00  Smokeless tobacco: Never Used Substance and Sexual Activity  Drug use: Defer      Objective:     Vitals:   07/07/21 1043 BP: 128/80 Pulse: 102 Temp: 36.7 C (98 F) SpO2: 96% Weight: 76.6 kg (168 lb 12.8 oz) Height: 182.9 cm (6')   Body mass index is 22.89 kg/m.   Physical Exam Vitals reviewed.  Constitutional:      General: He is not in acute  distress.    Appearance: Normal appearance.  HENT:     Head: Normocephalic and atraumatic.  Eyes:     General: No scleral icterus.    Conjunctiva/sclera: Conjunctivae normal.  Cardiovascular:     Rate and Rhythm: Normal rate and regular rhythm.  Pulmonary:     Effort: Pulmonary effort is normal. No respiratory distress.     Breath sounds: Normal breath sounds. No wheezing.  Abdominal:     General: There is no distension.     Palpations: Abdomen is soft.     Tenderness: There is no abdominal tenderness.     Comments: Small umbilical hernia, soft and nontender.  Musculoskeletal:        General: No swelling or deformity. Normal range of motion.     Cervical back: Normal range of motion.  Skin:    General: Skin is warm and dry.     Coloration: Skin is not jaundiced.  Neurological:     General: No focal deficit present.     Mental Status: He is alert and oriented to person, place, and time.  Psychiatric:        Mood and Affect: Mood normal.        Behavior: Behavior normal.        Thought Content: Thought content normal.          Assessment and Plan: Diagnoses and all orders for this visit:   Calculus of gallbladder without cholecystitis without obstruction     This is a 60 year old male with cholelithiasis and evidence of a transient episode of choledocholithiasis, given elevated LFTs during an episode of acute abdominal pain.  He did not have any signs of acute pancreatitis.  I personally reviewed his ultrasound and MRCP.  He has numerous gallstones but no evidence of acute cholecystitis.  The right hepatic duct does appear to have a low insertion on the common bile duct near the cystic duct.  The patient's symptoms are consistent with cholelithiasis.  Since he likely had an episode of choledocholithiasis, I recommended that he have a cholecystectomy to prevent another episode and associated complications such as pancreatitis and cholangitis.  He agrees to proceed with surgery.  Laparoscopic cholecystectomy was recommended. The details of this procedure were discussed with the patient, including the risks of bleeding, infection, bile leak, and <0.5% risk of common bile duct injury.  I discussed that he does have variant biliary anatomy but I think that cholecystectomy can still be safely performed.  I will perform an intraoperative cholangiogram if needed intraoperatively to ascertain his anatomy.  The patient expressed understanding and agrees to proceed with surgery.   Michaelle Birks, MD [facility] Surgery General, Hepatobiliary and Pancreatic Surgery 07/07/21 11:25 AM

## 2021-07-07 NOTE — H&P (View-Only) (Signed)
History of Present Illness: Robert Yates is a 60 y.o. male who was referred to me for evaluation of cholelithiasis with choledocholithiasis.  About 2 weeks ago he developed an acute episode of severe upper abdominal pain radiating across the upper abdomen.  It lasted for several hours and was associated with nausea and vomiting.  He went to the ED at [facility] on 9/14, where labs were significant for elevated transaminases (AST/ALT 447/355) and a mildly elevated total bilirubin of 1.4.  Lipase was normal.  An ultrasound showed cholelithiasis.  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  He was admitted and evaluated by Dr. Constance Haw.  He was treated symptomatically for his pain and symptoms improved by the following morning.  He was able to tolerate p.o. and his LFTs down trended.  He had repeat LFTs several days after discharge, at which time his bilirubin was normal and transaminases continue to downtrend.  Due to his aberrant biliary anatomy, he was referred to me for cholecystectomy.  Today he reports he has continued to have intermittent epigastric abdominal pain, but nothing as severe as what he had when he was admitted to the hospital.  He has not had any fevers or chills.   He reports a history of a heart murmur.  He did have an echo one year ago at which time his LVEF was normal and he had no valvular abnormalities.  He also has a history of COPD for which he uses a Breo inhaler daily.  He reports he does not have any shortness of breath unless he exerts himself significantly.  He can climb a flight of stairs without difficulty.  Has not had any prior abdominal surgeries.     Review of Systems: A complete review of systems was obtained from the patient.  I have reviewed this information and discussed as appropriate with the patient.  See HPI as well  for other ROS.       Medical History: Past Medical History Past Medical History: Diagnosis Date  COPD (chronic obstructive pulmonary disease) (CMS-HCC)    GERD (gastroesophageal reflux disease)        Patient Active Problem List Diagnosis  Calculus of gallbladder without cholecystitis without obstruction  Abdominal pain     Past Surgical History Past Surgical History: Procedure Laterality Date  TONSILLECTOMY          Allergies Allergies Allergen Reactions  Sulfa (Sulfonamide Antibiotics) Rash      Current Outpatient Medications on File Prior to Visit Medication Sig Dispense Refill  BREO ELLIPTA 200-25 mcg/dose DsDv Inhale 1 inhalation into the lungs once daily      metoprolol tartrate (LOPRESSOR) 25 MG tablet Take 12.5 mg by mouth 3 (three) times daily as needed      promethazine (PHENERGAN) 25 MG suppository Place rectally      nicotine (NICODERM CQ) 21 mg/24 hr patch        oxyCODONE (ROXICODONE) 5 MG immediate release tablet        PROMETHEGAN 25 mg suppository PLACE 1 SUPPOSITORY RECTALLY EVERY SIX HOURS AS NEEDED FOR REFRACTORY NAUSES/VOMITING       No current facility-administered medications on file prior to visit.     Family History Family History Problem Relation Age of Onset  Colon cancer Brother        Social History  Tobacco Use Smoking Status Current Every Day Smoker  Packs/day: 1.00 Smokeless Tobacco Never Used     Social History Social History    Socioeconomic History  Marital status: Unknown Tobacco Use  Smoking status: Current Every Day Smoker     Packs/day: 1.00  Smokeless tobacco: Never Used Substance and Sexual Activity  Drug use: Defer      Objective:     Vitals:   07/07/21 1043 BP: 128/80 Pulse: 102 Temp: 36.7 C (98 F) SpO2: 96% Weight: 76.6 kg (168 lb 12.8 oz) Height: 182.9 cm (6')   Body mass index is 22.89 kg/m.   Physical Exam Vitals reviewed.  Constitutional:      General: He is not in acute  distress.    Appearance: Normal appearance.  HENT:     Head: Normocephalic and atraumatic.  Eyes:     General: No scleral icterus.    Conjunctiva/sclera: Conjunctivae normal.  Cardiovascular:     Rate and Rhythm: Normal rate and regular rhythm.  Pulmonary:     Effort: Pulmonary effort is normal. No respiratory distress.     Breath sounds: Normal breath sounds. No wheezing.  Abdominal:     General: There is no distension.     Palpations: Abdomen is soft.     Tenderness: There is no abdominal tenderness.     Comments: Small umbilical hernia, soft and nontender.  Musculoskeletal:        General: No swelling or deformity. Normal range of motion.     Cervical back: Normal range of motion.  Skin:    General: Skin is warm and dry.     Coloration: Skin is not jaundiced.  Neurological:     General: No focal deficit present.     Mental Status: He is alert and oriented to person, place, and time.  Psychiatric:        Mood and Affect: Mood normal.        Behavior: Behavior normal.        Thought Content: Thought content normal.          Assessment and Plan: Diagnoses and all orders for this visit:   Calculus of gallbladder without cholecystitis without obstruction     This is a 60 year old male with cholelithiasis and evidence of a transient episode of choledocholithiasis, given elevated LFTs during an episode of acute abdominal pain.  He did not have any signs of acute pancreatitis.  I personally reviewed his ultrasound and MRCP.  He has numerous gallstones but no evidence of acute cholecystitis.  The right hepatic duct does appear to have a low insertion on the common bile duct near the cystic duct.  The patient's symptoms are consistent with cholelithiasis.  Since he likely had an episode of choledocholithiasis, I recommended that he have a cholecystectomy to prevent another episode and associated complications such as pancreatitis and cholangitis.  He agrees to proceed with surgery.  Laparoscopic cholecystectomy was recommended. The details of this procedure were discussed with the patient, including the risks of bleeding, infection, bile leak, and <0.5% risk of common bile duct injury.  I discussed that he does have variant biliary anatomy but I think that cholecystectomy can still be safely performed.  I will perform an intraoperative cholangiogram if needed intraoperatively to ascertain his anatomy.  The patient expressed understanding and agrees to proceed with surgery.   Michaelle Birks, MD [facility] General, Hepatobiliary and Pancreatic Surgery 07/07/21 11:25 AM

## 2021-07-13 ENCOUNTER — Other Ambulatory Visit: Payer: Self-pay

## 2021-07-13 ENCOUNTER — Encounter (HOSPITAL_COMMUNITY): Payer: Self-pay | Admitting: Surgery

## 2021-07-13 NOTE — Progress Notes (Signed)
PCP - pt denies Cardiologist - Dr. 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 Percival Spanish ordered for echo to be done last year and per pt MD said "everything checked out and nothing to worry about")  -------------  SDW INSTRUCTIONS:  Your procedure is scheduled on Wednesday 10/5. Please report to [facility] Main Entrance "A" at 1130 A.M., and check in at the Admitting office. Call this number if you have problems the morning of surgery: [phone]   Remember: Do not eat or drink after midnight the night before your surgery Medications to take morning of surgery with a sip of water include: metoprolol tartrate (LOPRESSOR)    If needed: acetaminophen (TYLENOL) fluticasone furoate-vilanterol (BREO ELLIPTA)  oxyCODONE (ROXICODONE) promethazine (PHENERGAN)   As of today, STOP taking any Aspirin (unless otherwise instructed by your surgeon), Aleve, Naproxen, Ibuprofen, Motrin, Advil, Goody's, BC's, all herbal medications, fish oil, and all vitamins.    The Morning of Surgery Do not wear jewelry Do not wear lotions, powders, colognes, or deodorant  Do not bring valuables to the hospital. [facility] is not responsible for any belongings or valuables.  If you are a smoker, DO NOT Smoke 24 hours prior to surgery  If you wear a CPAP at night please bring your mask the morning of surgery   Remember that you must have someone to transport you home after your surgery, and remain with you for 24 hours if you are discharged the same day.  Please bring cases for contacts, glasses, hearing aids, dentures or bridgework because it cannot be worn into surgery.   Patients discharged the day of surgery will not be allowed to drive home.   Please shower the NIGHT BEFORE/MORNING OF SURGERY (use antibacterial soap like DIAL soap if possible). Wear comfortable  clothes the morning of surgery. Oral Hygiene is also important to reduce your risk of infection.  Remember - BRUSH YOUR TEETH THE MORNING OF SURGERY WITH YOUR REGULAR TOOTHPASTE  Patient denies shortness of breath, fever, cough and chest pain.

## 2021-07-14 ENCOUNTER — Ambulatory Visit (HOSPITAL_COMMUNITY): Payer: 59 | Admitting: Anesthesiology

## 2021-07-14 ENCOUNTER — Other Ambulatory Visit: Payer: Self-pay

## 2021-07-14 ENCOUNTER — Encounter (HOSPITAL_COMMUNITY): Payer: Self-pay | Admitting: Surgery

## 2021-07-14 ENCOUNTER — Ambulatory Visit (HOSPITAL_COMMUNITY)
Admission: RE | Admit: 2021-07-14 | Discharge: 2021-07-14 | Disposition: A | Discharge status: Home / Self Care | Payer: 59 | Attending: Surgery | Admitting: Surgery

## 2021-07-14 ENCOUNTER — Encounter (HOSPITAL_COMMUNITY)
Admission: RE | Disposition: A | Discharge status: Home / Self Care | Payer: Self-pay | Source: Home / Self Care | Attending: Surgery

## 2021-07-14 DIAGNOSIS — Z79899 Other long term (current) drug therapy: Secondary | ICD-10-CM | POA: Insufficient documentation

## 2021-07-14 DIAGNOSIS — J449 Chronic obstructive pulmonary disease, unspecified: Secondary | ICD-10-CM | POA: Diagnosis not present

## 2021-07-14 DIAGNOSIS — Z882 Allergy status to sulfonamides status: Secondary | ICD-10-CM | POA: Insufficient documentation

## 2021-07-14 DIAGNOSIS — I10 Essential (primary) hypertension: Secondary | ICD-10-CM | POA: Insufficient documentation

## 2021-07-14 DIAGNOSIS — K8020 Calculus of gallbladder without cholecystitis without obstruction: Secondary | ICD-10-CM | POA: Diagnosis present

## 2021-07-14 DIAGNOSIS — K8010 Calculus of gallbladder with chronic cholecystitis without obstruction: Secondary | ICD-10-CM | POA: Insufficient documentation

## 2021-07-14 DIAGNOSIS — K219 Gastro-esophageal reflux disease without esophagitis: Secondary | ICD-10-CM | POA: Insufficient documentation

## 2021-07-14 DIAGNOSIS — Z7951 Long term (current) use of inhaled steroids: Secondary | ICD-10-CM | POA: Insufficient documentation

## 2021-07-14 DIAGNOSIS — F17200 Nicotine dependence, unspecified, uncomplicated: Secondary | ICD-10-CM | POA: Diagnosis not present

## 2021-07-14 HISTORY — PX: CHOLECYSTECTOMY: SHX55

## 2021-07-14 SURGERY — LAPAROSCOPIC CHOLECYSTECTOMY
Anesthesia: General | Site: Abdomen

## 2021-07-14 MED ORDER — LIDOCAINE 2% (20 MG/ML) 5 ML SYRINGE
INTRAMUSCULAR | Status: AC
  Filled 2021-07-14: qty 5

## 2021-07-14 MED ORDER — AMISULPRIDE (ANTIEMETIC) 5 MG/2ML IV SOLN
10.0000 mg | Freq: Once | INTRAVENOUS | Status: AC | PRN
  Administered 2021-07-14: 10 mg via INTRAVENOUS

## 2021-07-14 MED ORDER — FENTANYL CITRATE (PF) 100 MCG/2ML IJ SOLN
INTRAMUSCULAR | Status: AC
  Filled 2021-07-14: qty 2

## 2021-07-14 MED ORDER — CHLORHEXIDINE GLUCONATE 0.12 % MT SOLN
OROMUCOSAL | Status: AC
  Administered 2021-07-14: 15 mL via OROMUCOSAL
  Filled 2021-07-14: qty 15

## 2021-07-14 MED ORDER — DEXAMETHASONE SODIUM PHOSPHATE 10 MG/ML IJ SOLN
INTRAMUSCULAR | Status: DC | PRN
  Administered 2021-07-14: 8 mg via INTRAVENOUS

## 2021-07-14 MED ORDER — AMISULPRIDE (ANTIEMETIC) 5 MG/2ML IV SOLN
INTRAVENOUS | Status: AC
  Filled 2021-07-14: qty 2

## 2021-07-14 MED ORDER — LACTATED RINGERS IV SOLN: INTRAVENOUS | Status: DC

## 2021-07-14 MED ORDER — MIDAZOLAM HCL 2 MG/2ML IJ SOLN
INTRAMUSCULAR | Status: AC
  Filled 2021-07-14: qty 2

## 2021-07-14 MED ORDER — 0.9 % SODIUM CHLORIDE (POUR BTL) OPTIME
TOPICAL | Status: DC | PRN
  Administered 2021-07-14: 1000 mL

## 2021-07-14 MED ORDER — PROPOFOL 10 MG/ML IV BOLUS
INTRAVENOUS | Status: AC
  Filled 2021-07-14: qty 20

## 2021-07-14 MED ORDER — DEXAMETHASONE SODIUM PHOSPHATE 10 MG/ML IJ SOLN
INTRAMUSCULAR | Status: AC
  Filled 2021-07-14: qty 1

## 2021-07-14 MED ORDER — FENTANYL CITRATE (PF) 100 MCG/2ML IJ SOLN
25.0000 ug | INTRAMUSCULAR | Status: DC | PRN
  Administered 2021-07-14 (×3): 50 ug via INTRAVENOUS

## 2021-07-14 MED ORDER — ACETAMINOPHEN 500 MG PO TABS: 1000.0000 mg | ORAL_TABLET | ORAL | Status: AC

## 2021-07-14 MED ORDER — BUPIVACAINE-EPINEPHRINE 0.25% -1:200000 IJ SOLN
INTRAMUSCULAR | Status: DC | PRN
  Administered 2021-07-14: 20 mL

## 2021-07-14 MED ORDER — SODIUM CHLORIDE 0.9 % IR SOLN
Status: DC | PRN
  Administered 2021-07-14: 1000 mL

## 2021-07-14 MED ORDER — SUGAMMADEX SODIUM 200 MG/2ML IV SOLN
INTRAVENOUS | Status: DC | PRN
  Administered 2021-07-14: 200 mg via INTRAVENOUS

## 2021-07-14 MED ORDER — FENTANYL CITRATE (PF) 100 MCG/2ML IJ SOLN
INTRAMUSCULAR | Status: DC | PRN
  Administered 2021-07-14: 50 ug via INTRAVENOUS
  Administered 2021-07-14: 100 ug via INTRAVENOUS
  Administered 2021-07-14 (×2): 50 ug via INTRAVENOUS

## 2021-07-14 MED ORDER — GABAPENTIN 300 MG PO CAPS: 300.0000 mg | ORAL_CAPSULE | ORAL | Status: AC

## 2021-07-14 MED ORDER — HYDROCODONE-ACETAMINOPHEN 5-325 MG PO TABS: 1.0000 | ORAL_TABLET | Freq: Four times a day (QID) | ORAL | 0 refills | Status: DC | PRN

## 2021-07-14 MED ORDER — ROCURONIUM BROMIDE 10 MG/ML (PF) SYRINGE
PREFILLED_SYRINGE | INTRAVENOUS | Status: AC
  Filled 2021-07-14: qty 10

## 2021-07-14 MED ORDER — HEMOSTATIC AGENTS (NO CHARGE) OPTIME
TOPICAL | Status: DC | PRN
  Administered 2021-07-14: 1 via TOPICAL

## 2021-07-14 MED ORDER — CEFAZOLIN SODIUM-DEXTROSE 2-4 GM/100ML-% IV SOLN
2.0000 g | INTRAVENOUS | Status: AC
  Administered 2021-07-14: 2 g via INTRAVENOUS

## 2021-07-14 MED ORDER — ONDANSETRON HCL 4 MG/2ML IJ SOLN
INTRAMUSCULAR | Status: DC | PRN
Start: 2021-07-14 — End: 2021-07-14
  Administered 2021-07-14: 4 mg via INTRAVENOUS

## 2021-07-14 MED ORDER — GABAPENTIN 300 MG PO CAPS
ORAL_CAPSULE | ORAL | Status: AC
  Administered 2021-07-14: 300 mg via ORAL
  Filled 2021-07-14: qty 1

## 2021-07-14 MED ORDER — ROCURONIUM BROMIDE 10 MG/ML (PF) SYRINGE
PREFILLED_SYRINGE | INTRAVENOUS | Status: DC | PRN
  Administered 2021-07-14: 20 mg via INTRAVENOUS
  Administered 2021-07-14: 50 mg via INTRAVENOUS

## 2021-07-14 MED ORDER — ACETAMINOPHEN 500 MG PO TABS
ORAL_TABLET | ORAL | Status: AC
  Administered 2021-07-14: 1000 mg via ORAL
  Filled 2021-07-14: qty 2

## 2021-07-14 MED ORDER — FENTANYL CITRATE (PF) 250 MCG/5ML IJ SOLN
INTRAMUSCULAR | Status: AC
  Filled 2021-07-14: qty 5

## 2021-07-14 MED ORDER — BUPIVACAINE-EPINEPHRINE (PF) 0.25% -1:200000 IJ SOLN
INTRAMUSCULAR | Status: AC
  Filled 2021-07-14: qty 30

## 2021-07-14 MED ORDER — MIDAZOLAM HCL 2 MG/2ML IJ SOLN
INTRAMUSCULAR | Status: DC | PRN
  Administered 2021-07-14: 2 mg via INTRAVENOUS

## 2021-07-14 MED ORDER — CHLORHEXIDINE GLUCONATE 0.12 % MT SOLN: 15.0000 mL | Freq: Once | OROMUCOSAL | Status: AC

## 2021-07-14 MED ORDER — LIDOCAINE 2% (20 MG/ML) 5 ML SYRINGE
INTRAMUSCULAR | Status: DC | PRN
  Administered 2021-07-14: 60 mg via INTRAVENOUS

## 2021-07-14 MED ORDER — CEFAZOLIN SODIUM-DEXTROSE 2-4 GM/100ML-% IV SOLN
INTRAVENOUS | Status: AC
  Filled 2021-07-14: qty 100

## 2021-07-14 MED ORDER — ONDANSETRON HCL 4 MG/2ML IJ SOLN
INTRAMUSCULAR | Status: AC
  Filled 2021-07-14: qty 2

## 2021-07-14 MED ORDER — PROPOFOL 10 MG/ML IV BOLUS
INTRAVENOUS | Status: DC | PRN
  Administered 2021-07-14: 180 mg via INTRAVENOUS

## 2021-07-14 MED ORDER — ORAL CARE MOUTH RINSE: 15.0000 mL | Freq: Once | OROMUCOSAL | Status: AC

## 2021-07-14 MED ORDER — PROMETHAZINE HCL 25 MG/ML IJ SOLN: 6.2500 mg | INTRAMUSCULAR | Status: DC | PRN

## 2021-07-14 SURGICAL SUPPLY — 43 items
APPLICATOR ARISTA FLEXITIP XL (MISCELLANEOUS) ×2 IMPLANT
APPLIER CLIP 5 13 M/L LIGAMAX5 (MISCELLANEOUS) ×2
BAG COUNTER SPONGE SURGICOUNT (BAG) ×2 IMPLANT
BLADE CLIPPER SURG (BLADE) IMPLANT
CANISTER SUCT 3000ML PPV (MISCELLANEOUS) ×2 IMPLANT
CHLORAPREP W/TINT 26 (MISCELLANEOUS) ×2 IMPLANT
CLIP APPLIE 5 13 M/L LIGAMAX5 (MISCELLANEOUS) ×1 IMPLANT
COVER SURGICAL LIGHT HANDLE (MISCELLANEOUS) ×2 IMPLANT
DERMABOND ADVANCED (GAUZE/BANDAGES/DRESSINGS) ×1
DERMABOND ADVANCED .7 DNX12 (GAUZE/BANDAGES/DRESSINGS) ×1 IMPLANT
ELECT REM PT RETURN 9FT ADLT (ELECTROSURGICAL) ×2
ELECTRODE REM PT RTRN 9FT ADLT (ELECTROSURGICAL) ×1 IMPLANT
GLOVE SURG POLY MICRO LF SZ5.5 (GLOVE) ×2 IMPLANT
GLOVE SURG UNDER POLY LF SZ6 (GLOVE) ×2 IMPLANT
GOWN STRL REUS W/ TWL LRG LVL3 (GOWN DISPOSABLE) ×3 IMPLANT
GOWN STRL REUS W/TWL LRG LVL3 (GOWN DISPOSABLE) ×6
HEMOSTAT ARISTA ABSORB 3G PWDR (HEMOSTASIS) ×2 IMPLANT
KIT BASIN OR (CUSTOM PROCEDURE TRAY) ×2 IMPLANT
KIT TURNOVER KIT B (KITS) ×2 IMPLANT
L-HOOK LAP DISP 36CM (ELECTROSURGICAL) ×2
LHOOK LAP DISP 36CM (ELECTROSURGICAL) ×1 IMPLANT
NEEDLE INSUFFLATION 14GA 120MM (NEEDLE) IMPLANT
NS IRRIG 1000ML POUR BTL (IV SOLUTION) ×2 IMPLANT
PAD ARMBOARD 7.5X6 YLW CONV (MISCELLANEOUS) ×2 IMPLANT
PENCIL BUTTON HOLSTER BLD 10FT (ELECTRODE) ×2 IMPLANT
POUCH SPECIMEN RETRIEVAL 10MM (ENDOMECHANICALS) ×2 IMPLANT
SCISSORS LAP 5X35 DISP (ENDOMECHANICALS) ×2 IMPLANT
SET IRRIG TUBING LAPAROSCOPIC (IRRIGATION / IRRIGATOR) ×2 IMPLANT
SET TUBE SMOKE EVAC HIGH FLOW (TUBING) ×2 IMPLANT
SLEEVE ENDOPATH XCEL 5M (ENDOMECHANICALS) ×4 IMPLANT
SPECIMEN JAR SMALL (MISCELLANEOUS) ×2 IMPLANT
SUT MNCRL AB 4-0 PS2 18 (SUTURE) ×2 IMPLANT
SUT NOVA NAB GS-21 0 18 T12 DT (SUTURE) ×2 IMPLANT
SUT VIC AB 3-0 SH 27 (SUTURE)
SUT VIC AB 3-0 SH 27XBRD (SUTURE) IMPLANT
SUT VICRYL 0 UR6 27IN ABS (SUTURE) ×2 IMPLANT
TOWEL GREEN STERILE (TOWEL DISPOSABLE) ×2 IMPLANT
TOWEL GREEN STERILE FF (TOWEL DISPOSABLE) ×2 IMPLANT
TRAY LAPAROSCOPIC MC (CUSTOM PROCEDURE TRAY) ×2 IMPLANT
TROCAR XCEL 12X100 BLDLESS (ENDOMECHANICALS) IMPLANT
TROCAR XCEL BLUNT TIP 100MML (ENDOMECHANICALS) ×2 IMPLANT
TROCAR XCEL NON-BLD 5MMX100MML (ENDOMECHANICALS) ×2 IMPLANT
WATER STERILE IRR 1000ML POUR (IV SOLUTION) ×2 IMPLANT

## 2021-07-14 NOTE — Anesthesia Procedure Notes (Signed)
Procedure Name: Intubation Date/Time: 07/14/2021 1:39 PM Performed by: Barrington Ellison, CRNA Pre-anesthesia Checklist: Patient identified, Emergency Drugs available, Suction available and Patient being monitored Patient Re-evaluated:Patient Re-evaluated prior to induction Oxygen Delivery Method: Circle System Utilized Preoxygenation: Pre-oxygenation with 100% oxygen Induction Type: IV induction Ventilation: Mask ventilation without difficulty Laryngoscope Size: Glidescope and 4 Grade View: Grade I Tube type: Oral Tube size: 7.5 mm Number of attempts: 1 Airway Equipment and Method: Stylet and Oral airway Placement Confirmation: ETT inserted through vocal cords under direct vision, positive ETCO2 and breath sounds checked- equal and bilateral Secured at: 23 cm Tube secured with: Tape Dental Injury: Teeth and Oropharynx as per pre-operative assessment  Difficulty Due To: Difficult Airway- due to dentition Comments: Elective Glidescope used d/t presents of very loose teeth.

## 2021-07-14 NOTE — Discharge Instructions (Addendum)
[facility] DISCHARGE INSTRUCTIONS  Activity No heavy lifting greater than 15 pounds for 4 weeks after surgery. Ok to shower in 24 hours after surgery, but do not bathe or submerge incisions underwater. Do not drive while taking narcotic pain medication.  Wound Care Your incision is covered with skin glue called Dermabond. This will peel off on its own over time. You may shower and allow warm soapy water to run over your incisions. Gently pat dry. Do not submerge your incision underwater. Monitor your incision for any new redness, tenderness, or drainage.  When to Call us: Fever greater than 100.5 New redness, drainage, or swelling at incision site Severe pain, nausea, or vomiting Jaundice (yellowing of the whites of the eyes or skin)  Follow-up You have an appointment scheduled with Dr. Zenia Resides on August 02, 2021 at 3:30pm. This will be at the [facility] Surgery office at [address], [place], [place], [place]. Please arrive at least 15 minutes prior to your scheduled appointment time.  For questions or concerns, please call the office at [phone].

## 2021-07-14 NOTE — Transfer of Care (Signed)
Immediate Anesthesia Transfer of Care Note  Patient: [name]  Procedure(s) Performed: LAPAROSCOPIC CHOLECYSTECTOMY (Abdomen)  Patient Location: PACU  Anesthesia Type:General  Level of Consciousness: drowsy and patient cooperative  Airway & Oxygen Therapy: Patient Spontanous Breathing  Post-op Assessment: Report given to RN  Post vital signs: Reviewed and stable  Last Vitals:  Vitals Value Taken Time  BP 179/96 07/14/21 1453  Temp    Pulse 92 07/14/21 1455  Resp 23 07/14/21 1455  SpO2 99 % 07/14/21 1455  Vitals shown include unvalidated device data.  Last Pain:  Vitals:   07/14/21 1209  TempSrc:   PainSc: 0-No pain         Complications: No notable events documented.

## 2021-07-14 NOTE — Interval H&P Note (Signed)
History and Physical Interval Note:  07/14/2021 12:55 PM  Robert Yates  has presented today for surgery, with the diagnosis of CALCULUS OF GALLBLADDER WITHOUT CHOLECYSTITIS WITHOUT OBSTRUCTION.  The various methods of treatment have been discussed with the patient and family. After consideration of risks, benefits and other options for treatment, the patient has consented to  Procedure(s): LAPAROSCOPIC CHOLECYSTECTOMY WITH POSSIBLE INTRAOPERATIVE CHOLANGIOGRAM (N/A) as a surgical intervention.  The patient's history has been reviewed, patient examined, no change in status, stable for surgery.  I have reviewed the patient's chart and labs.  Questions were answered to the patient's satisfaction.     Dwan Bolt

## 2021-07-14 NOTE — Anesthesia Preprocedure Evaluation (Addendum)
Anesthesia Evaluation  Patient identified by MRN, date of birth, ID band Patient awake    Reviewed: Allergy & Precautions, NPO status , Patient's Chart, lab work & pertinent test results, reviewed documented beta blocker date and time   History of Anesthesia Complications Negative for: history of anesthetic complications  Airway Mallampati: II  TM Distance: >3 FB Neck ROM: Full    Dental  (+) Dental Advisory Given, Partial Lower, Poor Dentition   Pulmonary COPD,  COPD inhaler, Current SmokerPatient did not abstain from smoking.,    Pulmonary exam normal        Cardiovascular hypertension, Pt. on home beta blockers and Pt. on medications Normal cardiovascular exam   '21 TTE - EF 55 to 60%. Grade I diastolic dysfunction (impaired relaxation). No significant valvulopathy    Neuro/Psych negative neurological ROS  negative psych ROS   GI/Hepatic negative GI ROS, Neg liver ROS,   Endo/Other  negative endocrine ROS  Renal/GU negative Renal ROS     Musculoskeletal negative musculoskeletal ROS (+)   Abdominal   Peds  Hematology negative hematology ROS (+)   Anesthesia Other Findings   Reproductive/Obstetrics                            Anesthesia Physical Anesthesia Plan  ASA: 2  Anesthesia Plan: General   Post-op Pain Management:    Induction: Intravenous  PONV Risk Score and Plan: 3 and Treatment may vary due to age or medical condition, Ondansetron, Dexamethasone and Midazolam  Airway Management Planned: Oral ETT  Additional Equipment: None  Intra-op Plan:   Post-operative Plan: Extubation in OR  Informed Consent: I have reviewed the patients History and Physical, chart, labs and discussed the procedure including the risks, benefits and alternatives for the proposed anesthesia with the patient or authorized representative who has indicated his/her understanding and acceptance.      Dental advisory given  Plan Discussed with: CRNA and Anesthesiologist  Anesthesia Plan Comments:        Anesthesia Quick Evaluation

## 2021-07-14 NOTE — Op Note (Signed)
Date: 07/14/21  Patient: Robert Yates MRN: [id number]  Preoperative Diagnosis: Symptomatic cholelithiasis Postoperative Diagnosis: Same  Procedure: Laparoscopic cholecystectomy  Surgeon: Michaelle Birks, MD  EBL: 30 mL  Anesthesia: General endotracheal  Specimens: Gallbladder  Indications: Mr. Robert Yates is a 60 year old male who presented with epigastric abdominal pain and was found to have mildly elevated LFTs.  He was admitted overnight at [facility] and an MRCP did not show any choledocholithiasis or cholecystitis, and his LFTs subsequently down trended.  There was concern for aberrant biliary anatomy with a low insertion of the right posterior hepatic duct so he was referred to me outpatient for cholecystectomy.  After discussion of the risks and benefits of surgery he agreed to proceed with cholecystectomy with possible intraoperative cholangiogram.  Findings: Cholelithiasis with evidence of chronic cholecystitis.  Critical view achieved prior to ligation of the cystic duct.  The cystic duct was divided very close to the gallbladder with no dissection near the common bile duct, thus no aberrant biliary anatomy could be visualized.  Small umbilical hernia.  Procedure details: Informed consent was obtained in the preoperative area prior to the procedure. The patient was brought to the operating room and placed on the table in the supine position.  General anesthesia was induced and appropriate lines and drains were placed for intraoperative monitoring. Perioperative antibiotics were administered per SCIP guidelines. The abdomen was prepped and draped in the usual sterile fashion. A pre-procedure timeout was taken verifying patient identity, surgical site and procedure to be performed.  A small supraumbilical skin incision was made over a visible umbilical hernia, the subcutaneous tissue was divided with cautery, and the hernia sac was encountered.  The sac was opened and contained only  preperitoneal fat.  The umbilical stalk was circumferentially dissected out and the umbilical skin was sharply taken off the underlying hernia sac.  The sac was dissected off the fascia and excised.  The contents of the sac cannot be fully reduced, and so the fat was amputated using cautery.  The resulting hernia defect was very small, less than 0.5 cm.  The fascial defect was extended to accommodate a 12 mm trocar.  A 29mm Hassan trocar was then placed through the resulting fascial defect. The peritoneal cavity was inspected with no evidence of visceral or vascular injury. Three 30mm ports were placed in the right subcostal margin, all under direct visualization. The fundus of the gallbladder was grasped and retracted cephalad.  There were omental adhesions to the gallbladder, which were carefully taken down using both blunt dissection and cautery.  There were very thin filmy adhesions between the duodenum and the infundibulum of the gallbladder, which were very carefully taken down sharply staying close to the gallbladder.  The infundibulum was then retracted laterally. The cystic triangle was dissected out using cautery and blunt dissection, and the critical view of safety was obtained. The cystic duct and cystic artery were clipped and ligated, leaving 2 clips behind on the cystic duct and cystic artery stumps.  The cystic duct was divided very close to the gallbladder.  The gallbladder was taken off the liver using cautery. The specimen was placed in an endocatch bag and removed. The surgical site was irrigated with saline until the effluent was clear. Hemostasis was achieved in the gallbladder fossa using cautery.  There was a small superficial liver laceration just to the right of the gallbladder fossa caused by her retraction, and hemostasis was achieved at this using cautery.  The cystic duct and artery  stumps were visually inspected and there was no evidence of bile leak or bleeding.  The duodenum was  examined and was intact with no evidence of injury to the serosa.  Arista was placed on the gallbladder fossa.  The ports were removed under direct visualization and the abdomen was desufflated. The umbilical hernia defect was closed with simple interrupted 0 Novafil sutures.  The overlying umbilical skin was tacked down to the fascia using 3-0 Vicryl suture.  The skin at all port sites was closed with 4-0 monocryl subcuticular suture. Dermabond was applied.  The patient tolerated the procedure with no apparent complications.  All counts were correct x2 at the end of the procedure. The patient was extubated and taken to PACU in stable condition.  Michaelle Birks, MD 07/14/21 2:51 PM

## 2021-07-15 ENCOUNTER — Encounter (HOSPITAL_COMMUNITY): Payer: Self-pay | Admitting: Surgery

## 2021-07-15 LAB — SURGICAL PATHOLOGY

## 2021-07-19 NOTE — Anesthesia Postprocedure Evaluation (Signed)
Anesthesia Post Note  Patient: [name]  Procedure(s) Performed: LAPAROSCOPIC CHOLECYSTECTOMY (Abdomen)     Patient location during evaluation: PACU Anesthesia Type: General Level of consciousness: awake and alert Pain management: pain level controlled Vital Signs Assessment: post-procedure vital signs reviewed and stable Respiratory status: spontaneous breathing, nonlabored ventilation, respiratory function stable and patient connected to nasal cannula oxygen Cardiovascular status: blood pressure returned to baseline and stable Postop Assessment: no apparent nausea or vomiting Anesthetic complications: no   No notable events documented.  Last Vitals:  Vitals:   07/14/21 1520 07/14/21 1540  BP: (!) 159/81 (!) 153/90  Pulse: 89 91  Resp: 17 20  Temp:    SpO2: 98% 96%    Last Pain:  Vitals:   07/14/21 1540  TempSrc:   PainSc: 0-No pain   Pain Goal: Patients Stated Pain Goal: 3 (07/14/21 1505)                 Audry Pili

## 2021-07-27 ENCOUNTER — Other Ambulatory Visit: Payer: Self-pay | Admitting: *Deleted

## 2021-07-27 DIAGNOSIS — Z87891 Personal history of nicotine dependence: Secondary | ICD-10-CM

## 2021-07-27 DIAGNOSIS — F17210 Nicotine dependence, cigarettes, uncomplicated: Secondary | ICD-10-CM

## 2021-08-25 ENCOUNTER — Telehealth: Payer: 59 | Admitting: Acute Care

## 2021-08-26 ENCOUNTER — Ambulatory Visit (HOSPITAL_COMMUNITY): Payer: 59

## 2021-09-13 ENCOUNTER — Ambulatory Visit (INDEPENDENT_AMBULATORY_CARE_PROVIDER_SITE_OTHER): Payer: 59

## 2021-09-13 ENCOUNTER — Other Ambulatory Visit: Payer: Self-pay

## 2021-09-13 ENCOUNTER — Ambulatory Visit
Admission: RE | Admit: 2021-09-13 | Discharge: 2021-09-13 | Disposition: A | Discharge status: Home / Self Care | Payer: 59 | Source: Ambulatory Visit | Attending: Physician Assistant | Admitting: Physician Assistant

## 2021-09-13 VITALS — BP 109/70 | HR 102 | Temp 98.1°F | Resp 18

## 2021-09-13 DIAGNOSIS — J441 Chronic obstructive pulmonary disease with (acute) exacerbation: Secondary | ICD-10-CM

## 2021-09-13 DIAGNOSIS — R059 Cough, unspecified: Secondary | ICD-10-CM | POA: Diagnosis not present

## 2021-09-13 MED ORDER — DOXYCYCLINE HYCLATE 100 MG PO CAPS: 100.0000 mg | ORAL_CAPSULE | Freq: Two times a day (BID) | ORAL | 0 refills | Status: DC

## 2021-09-13 MED ORDER — PREDNISONE 50 MG PO TABS: ORAL_TABLET | ORAL | 0 refills | Status: DC

## 2021-09-13 NOTE — ED Triage Notes (Signed)
Pt is present today with a cough, sore throat, and fever. Pt sx started one week ago.

## 2021-09-13 NOTE — Discharge Instructions (Addendum)
Follow up with your Physician for recheck  

## 2021-09-14 LAB — COVID-19, FLU A+B NAA
Influenza A, NAA: NOT DETECTED
Influenza B, NAA: NOT DETECTED
SARS-CoV-2, NAA: NOT DETECTED

## 2021-09-17 NOTE — ED Provider Notes (Signed)
[facility]    CSN: [id number] Arrival date & time: 09/13/21  5366      History   Chief Complaint Chief Complaint  Patient presents with   Cough   Sore Throat   Fever    HPI Robert Yates is a 60 y.o. male.   The history is provided by the patient. No language interpreter was used.  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   Calculus of gallbladder without cholecystitis without obstruction    Tobacco abuse    RUQ pain    Elevated LFTs    Calculus of gallbladder with biliary obstruction but without cholecystitis    Acute cholecystitis 06/23/2021   Cholecystitis 06/23/2021   Common bile duct (CBD) obstruction    Cigarette smoker 06/10/2021   Educated about COVID-19 virus infection 06/04/2020   Palpitations 06/04/2020   COPD  GOLD at least 2  / active smoking  12/16/2019    Past Surgical History:  Procedure Laterality Date   CHOLECYSTECTOMY N/A 07/14/2021   Procedure: LAPAROSCOPIC CHOLECYSTECTOMY;  Surgeon: Dwan Bolt, MD;  Location: [place];  Service: General;  Laterality: N/A;   skin grafts     TONSILLECTOMY         Home Medications    Prior to Admission medications   Medication Sig Start Date End Date Taking? Authorizing Provider  doxycycline (VIBRAMYCIN) 100 MG capsule Take 1 capsule (100 mg total) by mouth 2 (two) times daily. 09/13/21  Yes Caryl Ada K, PA-C  predniSONE (DELTASONE) 50 MG tablet One tablet a day 09/13/21  Yes Fransico Meadow,  [place]  acetaminophen (TYLENOL) 500 MG tablet Take 1,000 mg by mouth every 6 (six) hours as needed (pain/headaches).    [provider]  fluticasone furoate-vilanterol (BREO ELLIPTA) 100-25 MCG/INH AEPB Inhale 1 puff into the lungs every morning. 06/10/21   Tanda Rockers, MD  HYDROcodone-acetaminophen (NORCO/VICODIN) 5-325 MG tablet Take 1 tablet by mouth every 6 (six) hours as needed for severe pain. 07/14/21   Dwan Bolt, MD  Ibuprofen-diphenhydrAMINE HCl (IBUPROFEN PM) 200-25 MG CAPS Take 2 tablets by mouth at bedtime as needed (pain/sleep).    [provider]  metoprolol tartrate (LOPRESSOR) 25 MG tablet Take 0.5 tablets (12.5 mg total) by mouth 2 (two) times daily. 06/25/21   Barton Dubois, MD  nicotine (NICODERM CQ - DOSED IN MG/24 HOURS) 21 mg/24hr patch Place 1 patch (21 mg total) onto the skin daily. Patient not taking: No sig reported 06/26/21   Barton Dubois, MD  promethazine (PHENERGAN) 25 MG suppository Place 1 suppository (25 mg total) rectally every 6 (six) hours as needed for refractory nausea / vomiting. 06/25/21   Barton Dubois, MD  promethazine (PHENERGAN) 25 MG tablet Take 1 tablet (25 mg total) by mouth every 6 (six) hours as needed for nausea or vomiting. 06/25/21   Barton Dubois, MD  vitamin B-12 (CYANOCOBALAMIN) 1000 MCG tablet Take 1,000 mcg by mouth 4 (four) times a week.    [provider]    Family History Family History  Adopted: Yes  Problem Relation Age of Onset   Colon cancer Brother 75    Social History Social History   Tobacco Use   Smoking status: Every Day    Packs/day: 1.00    Types: Cigarettes    Start date: 11/07/1974   Smokeless tobacco: Never   Tobacco comments:    1 pack a day 06/10/21   Vaping Use   Vaping Use: Never used  Substance Use Topics   Alcohol use: Yes    Comment: occasional   Drug use: Not Currently    Comment: Nothing in 30 years     Allergies   Sulfa antibiotics   Review of Systems Review  of Systems  Constitutional:  Positive for fever.  Respiratory:  Positive for cough.   All other systems reviewed and are negative.   Physical Exam Triage Vital Signs ED Triage Vitals  Enc Vitals Group     BP 09/13/21 1022 109/70     Pulse Rate 09/13/21 1022 (!) 102     Resp 09/13/21 1022 18     Temp 09/13/21 1022 98.1 F (36.7 C)     Temp Source 09/13/21 1022 Oral     SpO2 09/13/21 1022 95 %     Weight --      Height --      Head Circumference --      Peak Flow --      Pain Score 09/13/21 1021 7     Pain Loc --      Pain Edu? --      Excl. in [place]? --    No data found.  Updated Vital Signs BP 109/70 (BP Location: Left Arm)   Pulse (!) 102   Temp 98.1 F (36.7 C) (Oral)   Resp 18   SpO2 95%   Visual Acuity Right Eye Distance:   Left Eye Distance:   Bilateral Distance:    Right Eye Near:   Left Eye Near:    Bilateral Near:     Physical Exam Vitals and nursing note reviewed.  Constitutional:      Appearance: He is well-developed.  HENT:     Head: Normocephalic.     Mouth/Throat:     Mouth: Mucous membranes are moist.  Cardiovascular:     Rate and Rhythm: Normal rate.  Pulmonary:     Effort: Pulmonary effort is normal.  Abdominal:     General: There is no distension.  Musculoskeletal:        General: Normal range of motion.     Cervical back: Normal range of motion.  Neurological:     Mental Status: He is alert and oriented to person, place, and time.     UC Treatments / Results  Labs (all labs ordered are listed, but only abnormal results are displayed) Labs Reviewed  COVID-19, FLU A+B NAA   Narrative:    Performed at:  [address] [place], [place]  [id number] Lab Director: Rush Farmer MD, Phone:  [phone]    EKG   Radiology No results found.  Procedures Procedures (including critical care time)  Medications Ordered in UC Medications - No data to display  Initial Impression / Assessment and Plan / UC  Course  I have reviewed the triage vital signs and the nursing notes.  Pertinent labs & imaging results that were available during my care of the patient were reviewed by me and considered in my  medical decision making (see chart for details).      Final Clinical Impressions(s) / UC Diagnoses   Final diagnoses:  Cough, unspecified type  COPD exacerbation [facility])     Discharge Instructions      Follow up with your Physician for recheck    ED Prescriptions     Medication Sig Dispense Auth. Provider   doxycycline (VIBRAMYCIN) 100 MG capsule Take 1 capsule (100 mg total) by mouth 2 (two) times daily. 20 capsule Leodis Alcocer K, PA-C   predniSONE (DELTASONE) 50 MG tablet One tablet a day 6 tablet Fransico Meadow, [place]      An After Visit Summary was printed and given to the patient.  PDMP not reviewed this encounter.   Fransico Meadow, [place] 09/17/21 1954

## 2021-10-14 ENCOUNTER — Encounter: Payer: Self-pay | Admitting: *Deleted

## 2021-10-14 ENCOUNTER — Other Ambulatory Visit: Payer: Self-pay | Admitting: Internal Medicine

## 2021-11-08 NOTE — Progress Notes (Signed)
Referring Provider: Ailene Ards, NP Primary Care Physician:  Pcp, No Primary GI Physician: Dr. Abbey Chatters  Chief Complaint  Patient presents with   HFU    Off/on diarrhea/constipation    HPI:   Robert Yates is a 61 y.o. male presenting today for hospital follow-up.  He was hospitalized in September 2022 after presenting with acute onset chest/epigastric pain with associated nausea/vomiting, found to have newly elevated LFTs and slightly elevated bilirubin (AST 447, ALT 355, bilirubin 1.4), mild CBD dilation and cholelithiasis on ultrasound.  MRCP with no acute cholecystitis, variant biliary anatomy which could be clinically significant if cholecystectomy is planned, no CBD filling defects. Suspect bump in LFTs may have been secondary to passing a stone or microlithiasis. Recommended holding off on additional evaluation unless LFTs bump significantly or do not improve with cholecystectomy. General surgery recommended cholecystectomy with someone who had more expertise in hepatobiliary realm on outpatient basis.  He was treated supportively and had clinical improvement.  HFP 3 days after hospital discharge revealed improving LFTs, alk phos 148, AST 87, ALT 262.  Patient underwent laparoscopic cholecystectomy with Dr. Zenia Resides on 07/14/2021.  Pathology with chronic cholecystitis, cholelithiasis.  No repeat LFTs since that time.   Today:  Doing very well overall.  Had some alternating constipation and diarrhea for about 4 weeks after cholecystectomy, but this has leveled off.  No brbpr or melena. No abdominal pain, nausea, vomiting. Rare heartburn. No dysphagia.  No unintentional weight loss.  Last TCS 09/10/1993 done by Dr. Fuller Plan, fragments of colonic mucosa with focal changes of hyperplastic colon polyps (x3). Previously triaged by our office in July 2021, but he had been referred to cardiology for chest pain and heart palpitations.  He saw cardiology on 06/05/2020 and was noted to have PVCs on EKG  which is what they thought were contributing to palpitations.  For started on propranolol and scheduled for an echocardiogram.  Cardiology stated at that time that he was at acceptable risk for colonoscopy.  His echocardiogram was completed on 06/12/2020 with grade 1 diastolic dysfunction, LVEF 55-60%.  Some SOB with exertion. Has COPD. No CP or palpitations.   Past Medical History:  Diagnosis Date   COPD (chronic obstructive pulmonary disease) ([place])    Palpitations     Past Surgical History:  Procedure Laterality Date   CHOLECYSTECTOMY N/A 07/14/2021   Procedure: LAPAROSCOPIC CHOLECYSTECTOMY;  Surgeon: Dwan Bolt, MD;  Location: [place];  Service: General;  Laterality: N/A;   COLONOSCOPY  1994   Dr. Fuller Plan; hyperplastic polyps   skin grafts     TONSILLECTOMY      Current Outpatient Medications  Medication Sig Dispense Refill   acetaminophen (TYLENOL) 500 MG tablet Take 1,000 mg by mouth every 6 (six) hours as needed (pain/headaches).     fluticasone furoate-vilanterol (BREO ELLIPTA) 100-25 MCG/INH AEPB Inhale 1 puff into the lungs every morning. 28 each 11   Ibuprofen-diphenhydrAMINE HCl (IBUPROFEN PM) 200-25 MG CAPS Take 2 tablets by mouth at bedtime as needed (pain/sleep).     metoprolol tartrate (LOPRESSOR) 25 MG tablet Take 0.5 tablets (12.5 mg total) by mouth 2 (two) times daily. 60 tablet 1   vitamin B-12 (CYANOCOBALAMIN) 1000 MCG tablet Take 1,000 mcg by mouth 4 (four) times a week.     No current facility-administered medications for this visit.    Allergies as of 11/10/2021 - Review Complete 11/10/2021  Allergen Reaction Noted   Sulfa antibiotics Rash 11/08/2019    Family History  Adopted: Yes  Problem Relation Age of Onset   Colon cancer Brother        diagnosed in early 21s    Social History   Socioeconomic History   Marital status: Divorced    Spouse name: [name]   Number of children: [name]   Years of education: [name]   Highest education  level: [name]  Occupational History   Occupation: farmer  Tobacco Use   Smoking status: Every Day    Packs/day: 1.00    Types: Cigarettes    Start date: 11/07/1974   Smokeless tobacco: Never   Tobacco comments:    1 pack a day 06/10/21   Vaping Use   Vaping Use: Never used  Substance and Sexual Activity   Alcohol use: Yes    Comment: rare   Drug use: Not Currently    Comment: Nothing in 30 years   Sexual activity: [name]  Other Topics Concern   [name]  Social History Narrative   Divorced for 20 years,married for 13 years previously.Lives with girlfriend of 2 years.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   Social Determinants of Health   Financial Resource Strain: [name]  Food Insecurity: [name]  Transportation Needs: [name]  Physical Activity: [name]  Stress: [name]  Social Connections: [name]    Review of Systems: Gen: Denies fever, chills, cold or flulike symptoms, presyncope, syncope. CV: Denies chest pain, palpitations. Resp: Admits to chronic shortness of breath with exertion in the setting of COPD, no cough. GI: See HPI  Heme: See HPI  Physical Exam: BP 115/75    Pulse 74    Temp (!) 97.3 F (36.3 C)    Ht 6' 2"  (1.88 m)    Wt 174 lb 3.2 oz (79 kg)    BMI 22.37 kg/m  General:   Alert and oriented. No distress noted. Pleasant and cooperative.  Head:  Normocephalic and atraumatic. Eyes:  Conjuctiva clear without scleral icterus. Heart:  S1, S2 present without murmurs appreciated. Lungs:  Clear to auscultation bilaterally. No wheezes, rales, or rhonchi. No distress.  Abdomen:  +BS, soft, non-tender and non-distended. No rebound or guarding. No HSM or masses noted. Msk:  Symmetrical without gross deformities. Normal posture. Extremities:  Without edema. Neurologic:  Alert and  oriented x4 Psych:  Normal mood and affect.    Assessment: 61 year old male with history of COPD, heart palpitations, colon polyps, presenting  today for hospital follow-up of elevated LFTs.  He was hospitalized in September 2022 after presenting with acute onset chest/epigastric pain with associated nausea/vomiting, found to have newly elevated LFTs  and slightly elevated bilirubin (AST 447, ALT 355, bilirubin 1.4), mild CBD dilation and cholelithiasis on ultrasound.  MRCP with no acute cholecystitis,  variant biliary anatomy.  Suspected bump in LFTs was secondary to passing a stone/microlithiasis.  Recommended holding off on additional evaluation unless LFTs do not improve with cholecystectomy.  General surgery referred patient to Dr. Zenia Resides for cholecystectomy due to variant biliary anatomy.  He underwent cholecystectomy on 07/14/2021.  He has not had any repeat liver enzymes since that time.  Clinically, he is doing very well.  He had some alternating constipation and diarrhea initially after cholecystectomy, but this has leveled off and he does not feel that he needs any medications to help with this.  Notably, he is overdue for colonoscopy.  His last colonoscopy was in 1994.  He was found to have some hyperplastic polyps.  His brother has history of colon cancer, diagnosed in his early 29s.  Patient has no alarm symptoms at this time.  We will proceed with a colonoscopy in the near future.  Plan:  HFP. Start Benefiber 2 teaspoons daily x2 weeks and increase to twice daily to help with bowel regularity. Colonoscopy with propofol with Dr. Abbey Chatters in the near future. The risks, benefits, and alternatives have been discussed with the patient in detail. The patient states understanding and desires to proceed. ASA 3 Follow-up as needed.   Aliene Altes, PA-C [facility] 11/10/2021

## 2021-11-10 ENCOUNTER — Encounter: Payer: Self-pay | Admitting: Gastroenterology

## 2021-11-10 ENCOUNTER — Ambulatory Visit: Payer: 59 | Admitting: Gastroenterology

## 2021-11-10 ENCOUNTER — Other Ambulatory Visit: Payer: Self-pay

## 2021-11-10 VITALS — BP 115/75 | HR 74 | Temp 97.3°F | Ht 74.0 in | Wt 174.2 lb

## 2021-11-10 DIAGNOSIS — R198 Other specified symptoms and signs involving the digestive system and abdomen: Secondary | ICD-10-CM | POA: Diagnosis not present

## 2021-11-10 DIAGNOSIS — R7989 Other specified abnormal findings of blood chemistry: Secondary | ICD-10-CM | POA: Diagnosis not present

## 2021-11-10 DIAGNOSIS — Z86010 Personal history of colonic polyps: Secondary | ICD-10-CM

## 2021-11-10 NOTE — Patient Instructions (Addendum)
Please have blood work completed at [facility].  We will arrange for you to have a colonoscopy in early March with Dr. Abbey Chatters.  I recommend adding Benefiber 2 teaspoons daily x2 weeks then increase to twice daily to help with bowel regularity.  We will follow-up with you as needed.  Do not hesitate to call if you have any GI questions or concerns.  It was good to see you today!  I am glad you are doing well!  Aliene Altes, PA-C [facility]

## 2021-11-17 ENCOUNTER — Telehealth: Payer: Self-pay

## 2021-11-17 ENCOUNTER — Other Ambulatory Visit: Payer: Self-pay

## 2021-11-17 MED ORDER — PEG 3350-KCL-NA BICARB-NACL 420 G PO SOLR: 4000.0000 mL | ORAL | 0 refills | Status: DC

## 2021-11-17 NOTE — Telephone Encounter (Signed)
Pre-op appt 12/09/21. Appt letter mailed with procedure instructions.

## 2021-11-17 NOTE — Telephone Encounter (Signed)
Called pt, TCS w/Propofol w/Dr. Abbey Chatters ASA 3 scheduled for 12/13/21 at 11:00am. Rx for prep sent to pharmacy. Orders entered.

## 2021-11-22 ENCOUNTER — Telehealth: Payer: Self-pay | Admitting: Internal Medicine

## 2021-11-22 ENCOUNTER — Other Ambulatory Visit: Payer: Self-pay | Admitting: Internal Medicine

## 2021-11-22 ENCOUNTER — Other Ambulatory Visit: Payer: Self-pay

## 2021-11-22 MED ORDER — FLUTICASONE FUROATE-VILANTEROL 100-25 MCG/ACT IN AEPB: 1.0000 | INHALATION_SPRAY | Freq: Every day | RESPIRATORY_TRACT | 11 refills | Status: DC

## 2021-11-22 NOTE — Telephone Encounter (Signed)
Pt calling back about Breo.  Dr. Melvyn Novas was sending a prescription for a year when pt was seen in 06/10/21.  [place].  Please advise.

## 2021-11-22 NOTE — Telephone Encounter (Signed)
Called and notified patient of refill. Nothing further needed.

## 2021-11-22 NOTE — Telephone Encounter (Signed)
ATC patient to get more info regarding ins denial. No answer and no VM available. Refill sent to [facility] on file. Will ATC patient again in the next 24 hours.

## 2021-11-22 NOTE — Telephone Encounter (Signed)
ATC patient to get more info regarding insurance denial. No answer and no VM available. Refill sent to [facility]. Will ATC patient again in the next 24 hours.

## 2021-11-26 LAB — HEPATIC FUNCTION PANEL
AG Ratio: 1.8 (calc) (ref 1.0–2.5)
ALT: 23 U/L (ref 9–46)
AST: 16 U/L (ref 10–35)
Albumin: 4.4 g/dL (ref 3.6–5.1)
Alkaline phosphatase (APISO): 67 U/L (ref 35–144)
Bilirubin, Direct: 0.1 mg/dL (ref 0.0–0.2)
Globulin: 2.5 g/dL (calc) (ref 1.9–3.7)
Indirect Bilirubin: 0.5 mg/dL (calc) (ref 0.2–1.2)
Total Bilirubin: 0.6 mg/dL (ref 0.2–1.2)
Total Protein: 6.9 g/dL (ref 6.1–8.1)

## 2021-12-07 NOTE — Patient Instructions (Signed)
? ? ? ? ? ? Robert Yates ? 12/07/2021  ?  ? @PREFPERIOPPHARMACY @ ? ? Your procedure is scheduled on  12/13/2021. ? ? Report to Robert Yates at  [facility]. ? ? Call this number if you have problems the morning of surgery: ? [phone] ? ? Remember: ? Follow the diet and prep instructions given to you by the office. ? ?    Use your inhaler before you come and bring your rescue inhaler with you. ?  ? Take these medicines the morning of surgery with A SIP OF WATER  ? ?                                     metoprolol. ?  ? ? Do not wear jewelry, make-up or nail polish. ? Do not wear lotions, powders, or perfumes, or deodorant. ? Do not shave 48 hours prior to surgery.  Men may shave face and neck. ? Do not bring valuables to the hospital. ? Robert Yates is not responsible for any belongings or valuables. ? ?Contacts, dentures or bridgework may not be worn into surgery.  Leave your suitcase in the car.  After surgery it may be brought to your room. ? ?For patients admitted to the hospital, discharge time will be determined by your treatment team. ? ?Patients discharged the day of surgery will not be allowed to drive home and must have someone with them for 24 hours.  ? ? ?Special instructions:   DO NOT smoke tobacco or vape for 24 hours before your procedure. ? ?Please read over the following fact sheets that you were given. ?Anesthesia Post-op Instructions and Care and Recovery After Surgery ?  ? ? ? Colonoscopy, Adult, Care After ?This sheet gives you information about how to care for yourself after your procedure. Your health care provider may also give you more specific instructions. If you have problems or questions, contact your health care provider. ?What can I expect after the procedure? ?After the procedure, it is common to have: ?A small amount of blood in your stool for 24 hours after the procedure. ?Some gas. ?Mild cramping or bloating of your abdomen. ?Follow these instructions at home: ?Eating and  drinking ? ?Drink enough fluid to keep your urine pale yellow. ?Follow instructions from your health care provider about eating or drinking restrictions. ?Resume your normal diet as instructed by your health care provider. Avoid heavy or fried foods that are hard to digest. ?Activity ?Rest as told by your health care provider. ?Avoid sitting for a long time without moving. Get up to take short walks every 1-2 hours. This is important to improve blood flow and breathing. Ask for help if you feel weak or unsteady. ?Return to your normal activities as told by your health care provider. Ask your health care provider what activities are safe for you. ?Managing cramping and bloating ? ?Try walking around when you have cramps or feel bloated. ?Apply heat to your abdomen as told by your health care provider. Use the heat source that your health care provider recommends, such as a moist heat pack or a heating pad. ?Place a towel between your skin and the heat source. ?Leave the heat on for 20-30 minutes. ?Remove the heat if your skin turns bright red. This is especially important if you are unable to feel pain, heat, or cold. You may have a greater risk of  getting burned. ?General instructions ?If you were given a sedative during the procedure, it can affect you for several hours. Do not drive or operate machinery until your health care provider says that it is safe. ?For the first 24 hours after the procedure: ?Do not sign important documents. ?Do not drink alcohol. ?Do your regular daily activities at a slower pace than normal. ?Eat soft foods that are easy to digest. ?Take over-the-counter and prescription medicines only as told by your health care provider. ?Keep all follow-up visits as told by your health care provider. This is important. ?Contact a health care provider if: ?You have blood in your stool 2-3 days after the procedure. ?Get help right away if you have: ?More than a small spotting of blood in your  stool. ?Large blood clots in your stool. ?Swelling of your abdomen. ?Nausea or vomiting. ?A fever. ?Increasing pain in your abdomen that is not relieved with medicine. ?Summary ?After the procedure, it is common to have a small amount of blood in your stool. You may also have mild cramping and bloating of your abdomen. ?If you were given a sedative during the procedure, it can affect you for several hours. Do not drive or operate machinery until your health care provider says that it is safe. ?Get help right away if you have a lot of blood in your stool, nausea or vomiting, a fever, or increased pain in your abdomen. ?This information is not intended to replace advice given to you by your health care provider. Make sure you discuss any questions you have with your health care provider. ?Document Revised: 08/02/2019 Document Reviewed: 04/22/2019 ?Elsevier Patient Education ? Robert Yates. ?Monitored Anesthesia Care, Care After ?This sheet gives you information about how to care for yourself after your procedure. Your health care provider may also give you more specific instructions. If you have problems or questions, contact your health care provider. ?What can I expect after the procedure? ?After the procedure, it is common to have: ?Tiredness. ?Forgetfulness about what happened after the procedure. ?Impaired judgment for important decisions. ?Nausea or vomiting. ?Some difficulty with balance. ?Follow these instructions at home: ?For the time period you were told by your health care provider: ?  ?Rest as needed. ?Do not participate in activities where you could fall or become injured. ?Do not drive or use machinery. ?Do not drink alcohol. ?Do not take sleeping pills or medicines that cause drowsiness. ?Do not make important decisions or sign legal documents. ?Do not take care of children on your own. ?Eating and drinking ?Follow the diet that is recommended by your health care provider. ?Drink enough fluid to  keep your urine pale yellow. ?If you vomit: ?Drink water, juice, or soup when you can drink without vomiting. ?Make sure you have little or no nausea before eating solid foods. ?General instructions ?Have a responsible adult stay with you for the time you are told. It is important to have someone help care for you until you are awake and alert. ?Take over-the-counter and prescription medicines only as told by your health care provider. ?If you have sleep apnea, surgery and certain medicines can increase your risk for breathing problems. Follow instructions from your health care provider about wearing your sleep device: ?Anytime you are sleeping, including during daytime naps. ?While taking prescription pain medicines, sleeping medicines, or medicines that make you drowsy. ?Avoid smoking. ?Keep all follow-up visits as told by your health care provider. This is important. ?Contact a health care provider if: ?  You keep feeling nauseous or you keep vomiting. ?You feel light-headed. ?You are still sleepy or having trouble with balance after 24 hours. ?You develop a rash. ?You have a fever. ?You have redness or swelling around the IV site. ?Get help right away if: ?You have trouble breathing. ?You have new-onset confusion at home. ?Summary ?For several hours after your procedure, you may feel tired. You may also be forgetful and have poor judgment. ?Have a responsible adult stay with you for the time you are told. It is important to have someone help care for you until you are awake and alert. ?Rest as told. Do not drive or operate machinery. Do not drink alcohol or take sleeping pills. ?Get help right away if you have trouble breathing, or if you suddenly become confused. ?This information is not intended to replace advice given to you by your health care provider. Make sure you discuss any questions you have with your health care provider. ?Document Revised: 06/11/2020 Document Reviewed: 08/29/2019 ?Elsevier Patient  Education ? [place]. ? ?

## 2021-12-09 ENCOUNTER — Encounter (HOSPITAL_COMMUNITY)
Admission: RE | Admit: 2021-12-09 | Discharge: 2021-12-09 | Disposition: A | Discharge status: Home / Self Care | Payer: 59 | Source: Ambulatory Visit | Attending: Internal Medicine | Admitting: Internal Medicine

## 2021-12-09 ENCOUNTER — Encounter (HOSPITAL_COMMUNITY): Payer: Self-pay

## 2021-12-13 ENCOUNTER — Ambulatory Visit (HOSPITAL_COMMUNITY): Payer: 59 | Admitting: Anesthesiology

## 2021-12-13 ENCOUNTER — Ambulatory Visit (HOSPITAL_BASED_OUTPATIENT_CLINIC_OR_DEPARTMENT_OTHER): Payer: 59 | Admitting: Anesthesiology

## 2021-12-13 ENCOUNTER — Encounter (HOSPITAL_COMMUNITY)
Admission: RE | Disposition: A | Discharge status: Home / Self Care | Payer: Self-pay | Source: Home / Self Care | Attending: Internal Medicine

## 2021-12-13 ENCOUNTER — Encounter (HOSPITAL_COMMUNITY): Payer: Self-pay

## 2021-12-13 ENCOUNTER — Other Ambulatory Visit: Payer: Self-pay

## 2021-12-13 ENCOUNTER — Ambulatory Visit (HOSPITAL_COMMUNITY)
Admission: RE | Admit: 2021-12-13 | Discharge: 2021-12-13 | Disposition: A | Discharge status: Home / Self Care | Payer: 59 | Attending: Internal Medicine | Admitting: Internal Medicine

## 2021-12-13 DIAGNOSIS — K635 Polyp of colon: Secondary | ICD-10-CM

## 2021-12-13 DIAGNOSIS — J449 Chronic obstructive pulmonary disease, unspecified: Secondary | ICD-10-CM | POA: Insufficient documentation

## 2021-12-13 DIAGNOSIS — D123 Benign neoplasm of transverse colon: Secondary | ICD-10-CM | POA: Diagnosis not present

## 2021-12-13 DIAGNOSIS — Z7951 Long term (current) use of inhaled steroids: Secondary | ICD-10-CM | POA: Insufficient documentation

## 2021-12-13 DIAGNOSIS — Z86010 Personal history of colonic polyps: Secondary | ICD-10-CM

## 2021-12-13 DIAGNOSIS — D125 Benign neoplasm of sigmoid colon: Secondary | ICD-10-CM | POA: Insufficient documentation

## 2021-12-13 DIAGNOSIS — F17210 Nicotine dependence, cigarettes, uncomplicated: Secondary | ICD-10-CM | POA: Diagnosis not present

## 2021-12-13 DIAGNOSIS — K648 Other hemorrhoids: Secondary | ICD-10-CM | POA: Diagnosis not present

## 2021-12-13 DIAGNOSIS — Z1211 Encounter for screening for malignant neoplasm of colon: Secondary | ICD-10-CM | POA: Diagnosis not present

## 2021-12-13 DIAGNOSIS — Z8719 Personal history of other diseases of the digestive system: Secondary | ICD-10-CM | POA: Insufficient documentation

## 2021-12-13 DIAGNOSIS — D122 Benign neoplasm of ascending colon: Secondary | ICD-10-CM | POA: Diagnosis not present

## 2021-12-13 DIAGNOSIS — D120 Benign neoplasm of cecum: Secondary | ICD-10-CM | POA: Diagnosis not present

## 2021-12-13 DIAGNOSIS — K5730 Diverticulosis of large intestine without perforation or abscess without bleeding: Secondary | ICD-10-CM | POA: Diagnosis not present

## 2021-12-13 HISTORY — PX: POLYPECTOMY: SHX5525

## 2021-12-13 HISTORY — PX: COLONOSCOPY WITH PROPOFOL: SHX5780

## 2021-12-13 SURGERY — COLONOSCOPY WITH PROPOFOL
Anesthesia: General

## 2021-12-13 MED ORDER — PROPOFOL 10 MG/ML IV BOLUS
INTRAVENOUS | Status: DC | PRN
  Administered 2021-12-13: 100 mg via INTRAVENOUS
  Administered 2021-12-13 (×3): 50 mg via INTRAVENOUS
  Administered 2021-12-13: 40 mg via INTRAVENOUS

## 2021-12-13 MED ORDER — PHENYLEPHRINE 40 MCG/ML (10ML) SYRINGE FOR IV PUSH (FOR BLOOD PRESSURE SUPPORT)
PREFILLED_SYRINGE | INTRAVENOUS | Status: AC
  Filled 2021-12-13: qty 10

## 2021-12-13 MED ORDER — LACTATED RINGERS IV SOLN: INTRAVENOUS | Status: DC | PRN

## 2021-12-13 MED ORDER — PHENYLEPHRINE 40 MCG/ML (10ML) SYRINGE FOR IV PUSH (FOR BLOOD PRESSURE SUPPORT)
PREFILLED_SYRINGE | INTRAVENOUS | Status: DC | PRN
  Administered 2021-12-13 (×2): 80 ug via INTRAVENOUS

## 2021-12-13 MED ORDER — LIDOCAINE HCL (CARDIAC) PF 100 MG/5ML IV SOSY
PREFILLED_SYRINGE | INTRAVENOUS | Status: DC | PRN
  Administered 2021-12-13: 50 mg via INTRAVENOUS

## 2021-12-13 NOTE — Transfer of Care (Signed)
Immediate Anesthesia Transfer of Care Note ? ?Patient: Robert Yates ? ?Procedure(s) Performed: COLONOSCOPY WITH PROPOFOL ?POLYPECTOMY ? ?Patient Location: Short Stay ? ?Anesthesia Type:General ? ?Level of Consciousness: awake ? ?Airway & Oxygen Therapy: Patient Spontanous Breathing ? ?Post-op Assessment: Report given to RN and Post -op Vital signs reviewed and stable ? ?Post vital signs: Reviewed and stable ? ?Last Vitals:  ?Vitals Value Taken Time  ?BP    ?Temp    ?Pulse    ?Resp    ?SpO2    ? ? ?Last Pain:  ?Vitals:  ? 12/13/21 1127  ?TempSrc: Axillary  ?PainSc: 0-No pain  ?   ? ?  ? ?Complications: No notable events documented. ?

## 2021-12-13 NOTE — Anesthesia Procedure Notes (Signed)
Date/Time: 12/13/2021 11:34 AM ?Performed by: Orlie Dakin, CRNA ?Pre-anesthesia Checklist: Patient identified, Emergency Drugs available, Suction available and Patient being monitored ?Patient Re-evaluated:Patient Re-evaluated prior to induction ?Oxygen Delivery Method: Nasal cannula ?Induction Type: IV induction ?Placement Confirmation: positive ETCO2 ? ? ? ? ?

## 2021-12-13 NOTE — Progress Notes (Signed)
Girlfriend Rudean Curt at discharge patient "has had irregular heart beat for years". ?

## 2021-12-13 NOTE — H&P (Signed)
?Primary Care Physician:  Pcp, No ?Primary Gastroenterologist:  Dr. Abbey Chatters ? ?Pre-Procedure History & Physical: ?HPI:  Robert Yates is a 61 y.o. male is here for a colonoscopy for colon cancer screening purposes.  Patient denies any family history of colorectal cancer.  No melena or hematochezia.  No abdominal pain or unintentional weight loss.  No change in bowel habits.  Overall feels well from a GI standpoint. ? ?Past Medical History:  ?Diagnosis Date  ? COPD (chronic obstructive pulmonary disease) ([place])   ? Palpitations   ? ? ?Past Surgical History:  ?Procedure Laterality Date  ? CHOLECYSTECTOMY N/A 07/14/2021  ? Procedure: LAPAROSCOPIC CHOLECYSTECTOMY;  Surgeon: Dwan Bolt, MD;  Location: [facility];  Service: General;  Laterality: N/A;  ? COLONOSCOPY  1994  ? Dr. Fuller Plan; hyperplastic polyps  ? skin grafts    ? TONSILLECTOMY    ? ? ?Prior to Admission medications   ?Medication Sig Start Date End Date Taking? Authorizing Provider  ?fluticasone furoate-vilanterol (BREO ELLIPTA) 100-25 MCG/ACT AEPB Inhale 1 puff into the lungs daily. 11/22/21  Yes Tanda Rockers, MD  ?Ibuprofen-diphenhydrAMINE HCl (IBUPROFEN PM) 200-25 MG CAPS Take 2 tablets by mouth at bedtime.   Yes [provider]  ?metoprolol tartrate (LOPRESSOR) 25 MG tablet Take 0.5 tablets (12.5 mg total) by mouth 2 (two) times daily. 06/25/21  Yes Barton Dubois, MD  ?polyethylene glycol-electrolytes (TRILYTE) 420 g solution Take 4,000 mLs by mouth as directed. 11/17/21  Yes Eloise Harman, DO  ?acetaminophen (TYLENOL) 500 MG tablet Take 1,000 mg by mouth every 6 (six) hours as needed (pain/headaches).    [provider]  ? ? ?Allergies as of 11/17/2021 - Review Complete 11/10/2021  ?Allergen Reaction Noted  ? Sulfa antibiotics Rash 11/08/2019  ? ? ?Family History  ?Adopted: Yes  ?Problem Relation Age of Onset  ? Colon cancer Brother   ?     diagnosed in early 68s  ? ? ?Social History  ? ?Socioeconomic History  ? Marital status:  Divorced  ?  Spouse name: [name]  ? Number of children: [name]  ? Years of education: [name]  ? Highest education level: [name]  ?Occupational History  ? Occupation: farmer  ?Tobacco Use  ? Smoking status: Every Day  ?  Packs/day: 1.00  ?  Types: Cigarettes  ?  Start date: 11/07/1974  ? Smokeless tobacco: Never  ? Tobacco comments:  ?  1 pack a day 06/10/21   ?Vaping Use  ? Vaping Use: Never used  ?Substance and Sexual Activity  ? Alcohol use: Yes  ?  Comment: rare  ? Drug use: Not Currently  ?  Comment: Nothing in 30 years  ? Sexual activity: [name]  ?Other Topics Concern  ? [name]  ?Social History Narrative  ? 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  ? ?Social Determinants of Health  ? ?Financial Resource Strain: [name]  ?Food Insecurity: [name]  ?Transportation Needs: [name]  ?Physical Activity: [name]  ?Stress: [name]  ?Social Connections: [name]  ?Intimate Partner Violence: [name]  ? ? ?Review of Systems: ?See HPI, otherwise negative ROS ? ?Physical Exam: ?Vital signs in last 24 hours: ?Temp:  [97.8 ?F (36.6 ?C)] 97.8 ?F (36.6 ?C) (03/06 5631) ?Pulse Rate:  [72] 72 (03/06 0927) ?Resp:  [16] 16 (03/06  7989) ?BP: (151)/(95) 151/95 (03/06 2119) ?SpO2:  [99 %] 99 % (03/06 0927) ?  ?General:   Alert,  Well-developed, well-nourished, pleasant and cooperative in NAD ?Head:  Normocephalic and atraumatic. ?Eyes:  Sclera clear, no icterus.   Conjunctiva pink. ?Ears:  Normal auditory acuity. ?Nose:  No deformity, discharge,  or lesions. ?Mouth:  No deformity or lesions, dentition normal. ?Neck:  Supple; no masses or thyromegaly. ?Lungs:  Clear throughout to auscultation.   No wheezes, crackles, or rhonchi. No acute distress. ?Heart:  Regular rate and rhythm; no murmurs, clicks, rubs,  or gallops. ?Abdomen:  Soft, nontender and nondistended. No masses, hepatosplenomegaly or  hernias noted. Normal bowel sounds, without guarding, and without rebound.   ?Msk:  Symmetrical without gross deformities. Normal posture. ?Extremities:  Without clubbing or edema. ?Neurologic:  Alert and  oriented x4;  grossly normal neurologically. ?Skin:  Intact without significant lesions or rashes. ?Cervical Nodes:  No significant cervical adenopathy. ?Psych:  Alert and cooperative. Normal mood and affect. ? ?Impression/Plan: ?[name] is here for a colonoscopy to be performed for colon cancer screening purposes. ? ?The risks of the procedure including infection, bleed, or perforation as well as benefits, limitations, alternatives and imponderables have been reviewed with the patient. Questions have been answered. All parties agreeable. ? ?

## 2021-12-13 NOTE — Anesthesia Preprocedure Evaluation (Signed)
Anesthesia Evaluation  ?Patient identified by MRN, date of birth, ID band ?Patient awake ? ? ? ?Reviewed: ?Allergy & Precautions, H&P , NPO status , Patient's Chart, lab work & pertinent test results, reviewed documented beta blocker date and time  ? ?Airway ?Mallampati: II ? ?TM Distance: >3 FB ?Neck ROM: full ? ? ? Dental ?no notable dental hx. ? ?  ?Pulmonary ?COPD,  COPD inhaler, Current Smoker,  ?  ?Pulmonary exam normal ?breath sounds clear to auscultation ? ? ? ? ? ? Cardiovascular ?Exercise Tolerance: Good ?negative cardio ROS ? ? ?Rhythm:regular Rate:Normal ? ? ?  ?Neuro/Psych ?negative neurological ROS ? negative psych ROS  ? GI/Hepatic ?negative GI ROS, Neg liver ROS,   ?Endo/Other  ?negative endocrine ROS ? Renal/GU ?negative Renal ROS  ?negative genitourinary ?  ?Musculoskeletal ? ? Abdominal ?  ?Peds ? Hematology ?negative hematology ROS ?(+)   ?Anesthesia Other Findings ? ? Reproductive/Obstetrics ?negative OB ROS ? ?  ? ? ? ? ? ? ? ? ? ? ? ? ? ?  ?  ? ? ? ? ? ? ? ? ?Anesthesia Physical ?Anesthesia Plan ? ?ASA: 3 ? ?Anesthesia Plan: General  ? ?Post-op Pain Management:   ? ?Induction:  ? ?PONV Risk Score and Plan: Propofol infusion ? ?Airway Management Planned:  ? ?Additional Equipment:  ? ?Intra-op Plan:  ? ?Post-operative Plan:  ? ?Informed Consent: I have reviewed the patients History and Physical, chart, labs and discussed the procedure including the risks, benefits and alternatives for the proposed anesthesia with the patient or authorized representative who has indicated his/her understanding and acceptance.  ? ? ? ?Dental Advisory Given ? ?Plan Discussed with: CRNA ? ?Anesthesia Plan Comments:   ? ? ? ? ? ? ?Anesthesia Quick Evaluation ? ?

## 2021-12-13 NOTE — Op Note (Signed)
[facility] ?Patient Name: Robert Yates ?Procedure Date: 12/13/2021 11:17 AM ?MRN: [id number] ?Date of Birth: [birth year] ?Attending MD: Elon Alas. Abbey Chatters , DO ?CSN: [id number] ?Age: 61 ?Admit Type: Outpatient ?Procedure:                Colonoscopy ?Indications:              Screening for colorectal malignant neoplasm ?Providers:                Elon Alas. Abbey Chatters, DO, Lambert Mody, Emilee  ?                          Lynnell Grain RN, RN, Randa Spike, Technician ?Referring MD:              ?Medicines:                See the Anesthesia note for documentation of the  ?                          administered medications ?Complications:            No immediate complications. ?Estimated Blood Loss:     Estimated blood loss was minimal. ?Procedure:                Pre-Anesthesia Assessment: ?                          - The anesthesia plan was to use monitored  ?                          anesthesia care (MAC). ?                          After obtaining informed consent, the colonoscope  ?                          was passed under direct vision. Throughout the  ?                          procedure, the patient's blood pressure, pulse, and  ?                          oxygen saturations were monitored continuously. The  ?                          PCF-HQ190L ([id number]) scope was introduced through  ?                          the anus and advanced to the the cecum, identified  ?                          by appendiceal orifice and ileocecal valve. The  ?                          colonoscopy was performed without difficulty. The  ?                          patient tolerated the  procedure well. The quality  ?                          of the bowel preparation was evaluated using the  ?                          BBPS [facility] Bowel Preparation Scale) with scores  ?                          of: Right Colon = 3, Transverse Colon = 3 and Left  ?                          Colon = 3 (entire mucosa seen well with no residual  ?                           staining, small fragments of stool or opaque  ?                          liquid). The total BBPS score equals 9. ?Scope In: 11:29:32 AM ?Scope Out: 11:56:00 AM ?Scope Withdrawal Time: 0 hours 23 minutes 26 seconds  ?Total Procedure Duration: 0 hours 26 minutes 28 seconds  ?Findings: ?     The perianal and digital rectal examinations were normal. ?     Non-bleeding internal hemorrhoids were found during endoscopy. ?     Multiple small-mouthed diverticula were found in the sigmoid colon and  ?     descending colon. ?     Four sessile polyps were found in the ascending colon and cecum. The  ?     polyps were 3 to 6 mm in size. These polyps were removed with a cold  ?     snare. Resection and retrieval were complete. ?     Eight sessile polyps were found in the transverse colon. The polyps were  ?     4 to 8 mm in size. These polyps were removed with a cold snare.  ?     Resection and retrieval were complete. ?     Six sessile polyps were found in the sigmoid colon. The polyps were 4 to  ?     6 mm in size. These polyps were removed with a cold snare. Resection and  ?     retrieval were complete. ?     A 10 mm polyp was found in the recto-sigmoid colon approx 15 cm from  ?     anal verge. The polyp was sessile. The polyp was removed with a cold  ?     snare. Resection and retrieval were complete. ?Impression:               - Non-bleeding internal hemorrhoids. ?                          - Diverticulosis in the sigmoid colon and in the  ?                          descending colon. ?                          -  Four 3 to 6 mm polyps in the ascending colon and  ?                          in the cecum, removed with a cold snare. Resected  ?                          and retrieved. ?                          - Eight 4 to 8 mm polyps in the transverse colon,  ?                          removed with a cold snare. Resected and retrieved. ?                          - Six 4 to 6 mm polyps in the sigmoid colon,  ?                           removed with a cold snare. Resected and retrieved. ?                          - One 10 mm polyp at the recto-sigmoid colon,  ?                          removed with a cold snare. Resected and retrieved. ?Moderate Sedation: ?     Per Anesthesia Care ?Recommendation:           - Patient has a contact number available for  ?                          emergencies. The signs and symptoms of potential  ?                          delayed complications were discussed with the  ?                          patient. Return to normal activities tomorrow.  ?                          Written discharge instructions were provided to the  ?                          patient. ?                          - Resume previous diet. ?                          - Continue present medications. ?                          - Await pathology results. ?                          - Repeat  colonoscopy in 1 year for surveillance. ?                          - Return to GI clinic PRN. ?Procedure Code(s):        --- Professional --- ?                          [phone], Colonoscopy, flexible; with removal of  ?                          tumor(s), polyp(s), or other lesion(s) by snare  ?                          technique ?Diagnosis Code(s):        --- Professional --- ?                          K63.5, Polyp of colon ?                          Z12.11, Encounter for screening for malignant  ?                          neoplasm of colon ?                          K64.8, Other hemorrhoids ?                          K57.30, Diverticulosis of large intestine without  ?                          perforation or abscess without bleeding ?CPT copyright 2019 [employer] Medical Association. All rights reserved. ?The codes documented in this report are preliminary and upon coder review may  ?be revised to meet current compliance requirements. ?Elon Alas. Abbey Chatters, DO ?Elon Alas. [place], DO ?12/13/2021 11:58:56 AM ?This report has been signed electronically. ?Number of  Addenda: 0 ?

## 2021-12-13 NOTE — Discharge Instructions (Addendum)
?  Colonoscopy ?Discharge Instructions ? ?Read the instructions outlined below and refer to this sheet in the next few weeks. These discharge instructions provide you with general information on caring for yourself after you leave the hospital. Your doctor may also give you specific instructions. While your treatment has been planned according to the most current medical practices available, unavoidable complications occasionally occur.  ? ?ACTIVITY ?You may resume your regular activity, but move at a slower pace for the next 24 hours.  ?Take frequent rest periods for the next 24 hours.  ?Walking will help get rid of the air and reduce the bloated feeling in your belly (abdomen).  ?No driving for 24 hours (because of the medicine (anesthesia) used during the test).   ?Do not sign any important legal documents or operate any machinery for 24 hours (because of the anesthesia used during the test).  ?NUTRITION ?Drink plenty of fluids.  ?You may resume your normal diet as instructed by your doctor.  ?Begin with a light meal and progress to your normal diet. Heavy or fried foods are harder to digest and may make you feel sick to your stomach (nauseated).  ?Avoid alcoholic beverages for 24 hours or as instructed.  ?MEDICATIONS ?You may resume your normal medications unless your doctor tells you otherwise.  ?WHAT YOU CAN EXPECT TODAY ?Some feelings of bloating in the abdomen.  ?Passage of more gas than usual.  ?Spotting of blood in your stool or on the toilet paper.  ?IF YOU HAD POLYPS REMOVED DURING THE COLONOSCOPY: ?No aspirin products for 7 days or as instructed.  ?No alcohol for 7 days or as instructed.  ?Eat a soft diet for the next 24 hours.  ?FINDING OUT THE RESULTS OF YOUR TEST ?Not all test results are available during your visit. If your test results are not back during the visit, make an appointment with your caregiver to find out the results. Do not assume everything is normal if you have not heard from your  caregiver or the medical facility. It is important for you to follow up on all of your test results.  ?SEEK IMMEDIATE MEDICAL ATTENTION IF: ?You have more than a spotting of blood in your stool.  ?Your belly is swollen (abdominal distention).  ?You are nauseated or vomiting.  ?You have a temperature over 101.  ?You have abdominal pain or discomfort that is severe or gets worse throughout the day.  ? ?Your colonoscopy revealed 19 polyp(s) which I removed successfully. Await pathology results, my office will contact you. I recommend repeating colonoscopy in 1 year for surveillance purposes. Otherwise follow up with GI as needed.  ? ? ?I hope you have a great rest of your week! ? ?Elon Alas. Abbey Chatters, D.O. ?Gastroenterology and Hepatology ?[facility] ? ?

## 2021-12-14 LAB — SURGICAL PATHOLOGY

## 2021-12-14 NOTE — Anesthesia Postprocedure Evaluation (Signed)
Anesthesia Post Note ? ?Patient: Robert Yates ? ?Procedure(s) Performed: COLONOSCOPY WITH PROPOFOL ?POLYPECTOMY ? ?Patient location during evaluation: Phase II ?Anesthesia Type: General ?Level of consciousness: awake ?Pain management: pain level controlled ?Vital Signs Assessment: post-procedure vital signs reviewed and stable ?Respiratory status: spontaneous breathing and respiratory function stable ?Cardiovascular status: blood pressure returned to baseline and stable ?Postop Assessment: no headache and no apparent nausea or vomiting ?Anesthetic complications: no ?Comments: Late entry ? ? ?No notable events documented. ? ? ?Last Vitals:  ?Vitals:  ? 12/13/21 1127 12/13/21 1215  ?BP: (!) 101/42 (!) 110/58  ?Pulse: 74 76  ?Resp: 18 19  ?Temp: 36.6 ?C   ?SpO2: 98% 98%  ?  ?Last Pain:  ?Vitals:  ? 12/13/21 1210  ?TempSrc:   ?PainSc: 0-No pain  ? ? ?  ?  ?  ?  ?  ?  ? ?Louann Sjogren ? ? ? ? ?

## 2021-12-16 ENCOUNTER — Encounter (HOSPITAL_COMMUNITY): Payer: Self-pay | Admitting: Internal Medicine

## 2022-01-03 ENCOUNTER — Other Ambulatory Visit: Payer: Self-pay

## 2022-01-03 ENCOUNTER — Ambulatory Visit (INDEPENDENT_AMBULATORY_CARE_PROVIDER_SITE_OTHER): Payer: 59 | Admitting: Registered Nurse

## 2022-01-03 ENCOUNTER — Encounter: Payer: Self-pay | Admitting: Registered Nurse

## 2022-01-03 VITALS — BP 96/73 | HR 89 | Temp 98.2°F | Resp 18 | Ht 74.0 in | Wt 174.0 lb

## 2022-01-03 DIAGNOSIS — G479 Sleep disorder, unspecified: Secondary | ICD-10-CM | POA: Diagnosis not present

## 2022-01-03 DIAGNOSIS — L989 Disorder of the skin and subcutaneous tissue, unspecified: Secondary | ICD-10-CM

## 2022-01-03 DIAGNOSIS — Z125 Encounter for screening for malignant neoplasm of prostate: Secondary | ICD-10-CM

## 2022-01-03 DIAGNOSIS — R7989 Other specified abnormal findings of blood chemistry: Secondary | ICD-10-CM

## 2022-01-03 DIAGNOSIS — R6889 Other general symptoms and signs: Secondary | ICD-10-CM | POA: Diagnosis not present

## 2022-01-03 DIAGNOSIS — R251 Tremor, unspecified: Secondary | ICD-10-CM

## 2022-01-03 DIAGNOSIS — R002 Palpitations: Secondary | ICD-10-CM | POA: Diagnosis not present

## 2022-01-03 DIAGNOSIS — J449 Chronic obstructive pulmonary disease, unspecified: Secondary | ICD-10-CM

## 2022-01-03 LAB — CBC WITH DIFFERENTIAL/PLATELET
Basophils Absolute: 0.1 10*3/uL (ref 0.0–0.1)
Basophils Relative: 1.3 % (ref 0.0–3.0)
Eosinophils Absolute: 0.1 10*3/uL (ref 0.0–0.7)
Eosinophils Relative: 2.1 % (ref 0.0–5.0)
HCT: 52.6 % — ABNORMAL HIGH (ref 39.0–52.0)
Hemoglobin: 17.6 g/dL — ABNORMAL HIGH (ref 13.0–17.0)
Lymphocytes Relative: 28.5 % (ref 12.0–46.0)
Lymphs Abs: 1.7 10*3/uL (ref 0.7–4.0)
MCHC: 33.4 g/dL (ref 30.0–36.0)
MCV: 90.7 fl (ref 78.0–100.0)
Monocytes Absolute: 0.7 10*3/uL (ref 0.1–1.0)
Monocytes Relative: 11.7 % (ref 3.0–12.0)
Neutro Abs: 3.4 10*3/uL (ref 1.4–7.7)
Neutrophils Relative %: 56.4 % (ref 43.0–77.0)
Platelets: 280 10*3/uL (ref 150.0–400.0)
RBC: 5.81 Mil/uL (ref 4.22–5.81)
RDW: 14.2 % (ref 11.5–15.5)
WBC: 6 10*3/uL (ref 4.0–10.5)

## 2022-01-03 LAB — COMPREHENSIVE METABOLIC PANEL
ALT: 26 U/L (ref 0–53)
AST: 18 U/L (ref 0–37)
Albumin: 4.8 g/dL (ref 3.5–5.2)
Alkaline Phosphatase: 69 U/L (ref 39–117)
BUN: 12 mg/dL (ref 6–23)
CO2: 27 mEq/L (ref 19–32)
Calcium: 10.1 mg/dL (ref 8.4–10.5)
Chloride: 102 mEq/L (ref 96–112)
Creatinine, Ser: 0.86 mg/dL (ref 0.40–1.50)
GFR: 92.38 mL/min (ref >60.00)
Glucose, Bld: 81 mg/dL (ref 70–99)
Potassium: 4.1 mEq/L (ref 3.5–5.1)
Sodium: 137 mEq/L (ref 135–145)
Total Bilirubin: 0.4 mg/dL (ref 0.2–1.2)
Total Protein: 7.1 g/dL (ref 6.0–8.3)

## 2022-01-03 LAB — LIPID PANEL
Cholesterol: 202 mg/dL — ABNORMAL HIGH (ref 0–200)
HDL: 39.3 mg/dL (ref >39.00)
LDL Cholesterol: 123 mg/dL — ABNORMAL HIGH (ref 0–99)
NonHDL: 162.79
Total CHOL/HDL Ratio: 5
Triglycerides: 199 mg/dL — ABNORMAL HIGH (ref 0.0–149.0)
VLDL: 39.8 mg/dL (ref 0.0–40.0)

## 2022-01-03 LAB — HEMOGLOBIN A1C: Hgb A1c MFr Bld: 6 % (ref 4.6–6.5)

## 2022-01-03 MED ORDER — METOPROLOL TARTRATE 25 MG PO TABS: 12.5000 mg | ORAL_TABLET | Freq: Two times a day (BID) | ORAL | 1 refills | Status: DC

## 2022-01-03 MED ORDER — TRAZODONE HCL 50 MG PO TABS: 25.0000 mg | ORAL_TABLET | Freq: Every evening | ORAL | 3 refills | Status: DC | PRN

## 2022-01-03 NOTE — Assessment & Plan Note (Signed)
Check labs today. Follow up prn.  ?

## 2022-01-03 NOTE — Assessment & Plan Note (Signed)
Stable on breo ellipta. Follows with Dr. Melvyn Novas. ?

## 2022-01-03 NOTE — Assessment & Plan Note (Signed)
Start trazodone 25-'50mg'$  po qhs prn. Can increase up to '100mg'$  po qhs prn. Follow up if uncontrolled. Reviewed risks, benefits, and side effects, pt voices understanding. ? ?

## 2022-01-03 NOTE — Patient Instructions (Addendum)
Robert Yates -  ? ?Great to meet you ? ?Call with concerns ? ?Trazodone 25-50 mg nightly. If ineffective, ok to increase to '100mg'$ . Let me know if you do ? ?Keep mind active and body moving. ? ?Will refer to neuro and derm ? ?I will call if labs show concerns ? ?Thanks, ? ?Rich  ? ? ? ?If you have lab work done today you will be contacted with your lab results within the next 2 weeks.  If you have not heard from [place] then please contact us. The fastest way to get your results is to register for My Chart. ? ? ?IF you received an x-ray today, you will receive an invoice from [facility]. Please contact [facility] at [phone] with questions or concerns regarding your invoice.  ? ?IF you received labwork today, you will receive an invoice from [facility]. Please contact [facility] at [phone] with questions or concerns regarding your invoice.  ? ?Our billing staff will not be able to assist you with questions regarding bills from these companies. ? ?You will be contacted with the lab results as soon as they are available. The fastest way to get your results is to activate your My Chart account. Instructions are located on the last page of this paperwork. If you have not heard from [place] regarding the results in 2 weeks, please contact this office. ?  ? ? ?

## 2022-01-03 NOTE — Progress Notes (Signed)
? ?New Patient Office Visit ? ?Subjective:  ?Patient ID: Robert Yates, male    DOB: 07-01-1961  Age: 61 y.o. MRN: [id number] ? ?CC:  ?Chief Complaint  ?Patient presents with  ? New Patient (Initial Visit)  ?  Patient states he is here to establish care. Skin irritation on his nose and medication refill.  ? ? ?HPI ?Robert Yates presents to establish care ?Histories reviewed and updated with patient.  ? ?Notes epigastric pain ?Since colonoscopy in early March ?Some constipation since then.  ?Some epigastric pain ?Hx of gerd. ? 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 ?Past dermatologist doesn't take his current ins ? ?Sleep disturbance ?Has tried ibuprofen pm, melatonin. No relief. ?Has tried higher doses of melatonin 30-'40mg'$ , no effect.  ?Good sleep hygiene.  ? ?Neuro changes ?Tremor at rest, feels fidgeting ?Occ drops items ?Feels forgetful at times, worse since retirement ?No headaches, visual changes, LOC, word substitution  ? ?Outpatient Encounter Medications as of 01/03/2022  ?Medication Sig  ? acetaminophen (TYLENOL) 500 MG tablet Take 1,000 mg by mouth every 6 (six) hours as needed (pain/headaches).  ? fluticasone furoate-vilanterol (BREO ELLIPTA) 100-25 MCG/ACT AEPB Inhale 1 puff into the lungs daily.  ? Ibuprofen-diphenhydrAMINE HCl (IBUPROFEN PM) 200-25 MG CAPS Take 2 tablets by mouth at bedtime.  ? traZODone (DESYREL) 50 MG tablet Take 0.5-1 tablets (25-50 mg total) by mouth at bedtime as needed for sleep.  ? [DISCONTINUED] metoprolol tartrate (LOPRESSOR) 25 MG tablet Take 0.5 tablets (12.5 mg total) by mouth 2 (two) times daily.  ? metoprolol tartrate (LOPRESSOR) 25 MG tablet Take 0.5 tablets (12.5 mg total) by mouth 2 (two) times daily.  ? ?No facility-administered encounter medications on file as of 01/03/2022.  ? ? ?Past Medical History:  ?Diagnosis Date  ? Allergy 1970  ? Anxiety 2021  ? Asthma 2020  ? Cataract 2000  ? COPD (chronic obstructive  pulmonary disease) ([place])   ? GERD (gastroesophageal reflux disease) 2020  ? Heart murmur 1970  ? Palpitations   ? ? ?Past Surgical History:  ?Procedure Laterality Date  ? CHOLECYSTECTOMY N/A 07/14/2021  ? Procedure: LAPAROSCOPIC CHOLECYSTECTOMY;  Surgeon: Dwan Bolt, MD;  Location: [facility];  Service: General;  Laterality: N/A;  ? COLONOSCOPY  1994  ? Dr. Fuller Plan; hyperplastic polyps  ? COLONOSCOPY WITH PROPOFOL N/A 12/13/2021  ? Procedure: COLONOSCOPY WITH PROPOFOL;  Surgeon: Eloise Harman, DO;  [facility];  Service: Endoscopy;  Laterality: N/A;  11:00am  ? POLYPECTOMY  12/13/2021  ? Procedure: POLYPECTOMY;  Surgeon: Eloise Harman, DO;  [facility];  Service: Endoscopy;;  ? skin grafts    ? TONSILLECTOMY    ? ? ?Family History  ?Adopted: Yes  ?Problem Relation Age of Onset  ? Colon cancer Brother   ?     diagnosed in early 46s  ? ? ?Social History  ? ?Socioeconomic History  ? Marital status: Divorced  ?  Spouse name: [name]  ? Number of children: [name]  ? Years of education: [name]  ? Highest education level: [name]  ?Occupational History  ? Occupation: farmer  ?Tobacco Use  ? Smoking status: Every Day  ?  Packs/day: 1.00  ?  Types: Cigarettes  ?  Start date: 11/07/1974  ? Smokeless tobacco: Never  ? Tobacco comments:  ?  have been smoking for several yrs nothing seems to help  me stop  ?Vaping Use  ? Vaping Use: Never used  ?Substance and Sexual Activity  ? Alcohol use: Yes  ?  Comment: very seldom use of alcohol  ? Drug use: Never  ?  Comment: Nothing in 30 years  ? Sexual activity: Yes  ?Other Topics Concern  ? [name]  ?Social History Narrative  ? 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  ? ?Social Determinants of Health  ? ?Financial Resource Strain: [name]  ?Food Insecurity: [name]  ?Transportation Needs: [name]  ?Physical Activity: [name]  ?Stress: Not  on file  ?Social Connections: [name]  ?Intimate Partner Violence: [name]  ? ? ?ROS ?Review of Systems ?Per hpi  ? ?Objective:  ? ?Today's Vitals: BP 96/73   Pulse 89   Temp 98.2 ?F (36.8 ?C) (Temporal)   Resp 18   Ht '6\' 2"'$  (1.88 m)   Wt 174 lb (78.9 kg)   SpO2 97%   BMI 22.34 kg/m?  ? ?Physical Exam ?Constitutional:   ?   General: He is not in acute distress. ?   Appearance: Normal appearance. He is normal weight. He is not ill-appearing, toxic-appearing or diaphoretic.  ?Cardiovascular:  ?   Rate and Rhythm: Normal rate and regular rhythm.  ?   Heart sounds: Normal heart sounds. No murmur heard. ?  No friction rub. No gallop.  ?Pulmonary:  ?   Effort: Pulmonary effort is normal. No respiratory distress.  ?   Breath sounds: Normal breath sounds. No stridor. No wheezing, rhonchi or rales.  ?Chest:  ?   Chest wall: No tenderness.  ?Neurological:  ?   General: No focal deficit present.  ?   Mental Status: He is alert and oriented to person, place, and time. Mental status is at baseline.  ?   Cranial Nerves: No cranial nerve deficit.  ?   Sensory: No sensory deficit.  ?   Motor: No weakness.  ?   Gait: Gait normal.  ?   Comments: Mild resting tremor  ?Psychiatric:     ?   Mood and Affect: Mood normal.     ?   Behavior: Behavior normal.     ?   Thought Content: Thought content normal.     ?   Judgment: Judgment normal.  ? ? ? ? ? ? ?Assessment & Plan:  ? ?Problem List Items Addressed This Visit   ? ?  ? Respiratory  ? COPD  GOLD at least 2  / active smoking   ?  Stable on breo ellipta. Follows with Dr. Melvyn Novas. ?  ?  ?  ? Musculoskeletal and Integument  ? Skin lesion of face  ?  Appears as AK. Refer to derm. Ok to use OTC sparingly. Discussed ABCDE recommendations for suspicious lesions ?  ?  ? Relevant Orders  ? Ambulatory referral to Dermatology  ?  ? Other  ? Palpitations - Primary  ? Relevant Medications  ? metoprolol tartrate (LOPRESSOR) 25 MG tablet  ? Other Relevant Orders  ? CBC with  Differential/Platelet  ? Comprehensive metabolic panel  ? Hemoglobin A1c  ? Lipid panel  ? TSH  ? B12 and Folate Panel  ? Vitamin D (25 hydroxy)  ? Iron, TIBC and Ferritin Panel  ? Elevated LFTs  ?  Check labs today. Follow up prn.  ?  ?  ? Sleep disturbance  ?  Start trazodone 25-'50mg'$  po qhs prn. Can increase up to '100mg'$  po qhs prn. Follow up if uncontrolled. Reviewed risks, benefits, and side effects, pt voices understanding. ? ?  ?  ? Relevant Medications  ? traZODone (DESYREL) 50 MG tablet  ? Other Relevant Orders  ? CBC with Differential/Platelet  ? Comprehensive metabolic panel  ? Hemoglobin A1c  ? Lipid panel  ? TSH  ? B12 and Folate Panel  ? Vitamin D (25 hydroxy)  ? Iron, TIBC and Ferritin Panel  ? Tremor  ? Relevant Orders  ? Ambulatory referral to Neurology  ? CBC with Differential/Platelet  ? Comprehensive metabolic panel  ? Hemoglobin A1c  ? Lipid panel  ? TSH  ? B12 and Folate Panel  ? Vitamin D (25 hydroxy)  ? Iron, TIBC and Ferritin Panel  ? ?Other Visit Diagnoses   ? ? Forgetfulness      ? Relevant Orders  ? Ambulatory referral to Neurology  ? CBC with Differential/Platelet  ? Comprehensive metabolic panel  ? Hemoglobin A1c  ? Lipid panel  ? TSH  ? B12 and Folate Panel  ? Vitamin D (25 hydroxy)  ? Iron, TIBC and Ferritin Panel  ? Screening PSA (prostate specific antigen)      ? Relevant Orders  ? PSA  ? ?  ? ? ?Follow-up: Return in about 6 months (around 07/06/2022) for CPE and labs.  ? ?Maximiano Coss, NP ?

## 2022-01-03 NOTE — Assessment & Plan Note (Signed)
Appears as AK. Refer to derm. Ok to use OTC sparingly. Discussed ABCDE recommendations for suspicious lesions ?

## 2022-01-04 LAB — IRON,TIBC AND FERRITIN PANEL
%SAT: 26 % (calc) (ref 20–48)
Ferritin: 69 ng/mL (ref 24–380)
Iron: 84 ug/dL (ref 50–180)
TIBC: 322 mcg/dL (calc) (ref 250–425)

## 2022-01-04 LAB — PSA: PSA: 2.09 ng/mL (ref 0.10–4.00)

## 2022-01-04 LAB — B12 AND FOLATE PANEL
Folate: 8.3 ng/mL (ref >5.9)
Vitamin B-12: 497 pg/mL (ref 211–911)

## 2022-01-04 LAB — VITAMIN D 25 HYDROXY (VIT D DEFICIENCY, FRACTURES): VITD: 28.48 ng/mL — ABNORMAL LOW (ref 30.00–100.00)

## 2022-01-04 LAB — TSH: TSH: 1.82 u[IU]/mL (ref 0.35–5.50)

## 2022-01-06 ENCOUNTER — Encounter: Payer: Self-pay | Admitting: Registered Nurse

## 2022-01-07 ENCOUNTER — Other Ambulatory Visit: Payer: Self-pay | Admitting: Registered Nurse

## 2022-01-07 DIAGNOSIS — G479 Sleep disorder, unspecified: Secondary | ICD-10-CM

## 2022-01-07 MED ORDER — QUETIAPINE FUMARATE 100 MG PO TABS: 50.0000 mg | ORAL_TABLET | Freq: Every day | ORAL | 0 refills | Status: DC

## 2022-02-04 ENCOUNTER — Other Ambulatory Visit: Payer: Self-pay | Admitting: Registered Nurse

## 2022-02-04 DIAGNOSIS — G479 Sleep disorder, unspecified: Secondary | ICD-10-CM

## 2022-02-21 ENCOUNTER — Ambulatory Visit: Payer: 59 | Admitting: Neurology

## 2022-02-21 ENCOUNTER — Encounter: Payer: Self-pay | Admitting: Neurology

## 2022-02-21 VITALS — BP 122/77 | HR 87 | Ht 74.0 in | Wt 174.0 lb

## 2022-02-21 DIAGNOSIS — R6889 Other general symptoms and signs: Secondary | ICD-10-CM | POA: Diagnosis not present

## 2022-02-21 NOTE — Patient Instructions (Addendum)
You have complaints of memory loss: memory loss or changes in cognitive function can have many reasons and does not always mean you have dementia. Conditions that can contribute to subjective or objective memory loss include: depression, stress, poor sleep from insomnia or sleep apnea, dehydration, fluctuation in blood sugar values, thyroid or electrolyte dysfunction and certain vitamin deficiencies. Dementia can be caused by stroke, brain atherosclerosis or brain vascular disease due to vascular risk factors (smoking, high blood pressure, high cholesterol, obesity and uncontrolled diabetes), certain degenerative brain disorders (including Parkinson's disease and Multiple sclerosis) and by Alzheimer's disease or other, more rare and sometimes hereditary causes. We will do some additional testing:  ? ?You had recent blood work already, through your PCP.  ?I recommend, we proceed with a brain scan, called MRI and call you with the test results. We will have to schedule you for this on a separate date. This test requires authorization from your insurance, and we will take care of the insurance process. I understand, that you would like to hold off on this at this time.  ? ?I could order a open MRI and we can also utilize anxiety medication for your MRI once we schedule this.  Please let us know if you would like to proceed.  Please talk to your primary care about smoking cessation and anxiety management. ? ?I also recommend a formal cognitive test called neuropsychological evaluation which is done by a licensed neuropsychologist. We can make a referral in that regard.  I understand, that he would like to hold off on this referral at this time.  Please follow-up with your primary care as scheduled/planned. Your memory scores are good today, which, of course is reassuring.  ? ? ? ? ?

## 2022-02-21 NOTE — Progress Notes (Signed)
Subjective:  ?  ?Patient ID: Robert Yates is a 61 y.o. male. ? ?HPI ? ? ? ?Robert Age, MD, PhD ?[facility] ?[address], [address] ?[phone] ?[place], [place] [address] ? ?Dear Robert Yates, ? ?I saw your patient, Robert Yates, upon your kind request in my neurologic clinic today for initial consultation of his memory loss. The patient is unaccompanied today.  As you know, Robert Yates is a 61 year old right-handed gentleman with an underlying medical history of COPD, smoking, palpitations, reflux disease, allergies, asthma, and anxiety, who reports being forgetful, essentially since he retired some 15 months ago.  He feels like he is having to write things down more and does not remember even simple things such as appointments or tasks.  He lives with his girlfriend who has noticed some of his forgetfulness.  He has no obvious family history of dementia but does not know much about his family history, reports that he grew up in foster care.  Biological mom and dad as far as he knows did not have any dementia.  He has had more anxiety since his retirement.  He reports that in the past year or 2 he has had a couple of panic attacks.  He has not addressed his anxiety issues or panic attacks with you yet.  He feels like he is more fidgety.  He has occasional trembling which is not a new issue, affects both hands.  He would like to hold off on a brain MRI or cognitive evaluation with a neuropsychologist as we talk about his issues as he feels like he has more medical bills now than he can afford currently.  He needs to get dentures as well, had a tooth pulled recently.  He has been smoking about a pack per day, would like to quit smoking but has not been able to quit.  He has not tried Chantix but has had friends who had side effects on Chantix.  He tries to hydrate better with water and in the past 2 to 3 weeks he has increased his water intake to about 2-3 bottles per day.  He drinks no caffeine on  a day-to-day basis, drinks 1 cup of decaf coffee and maybe decaf tea during the day, no soda.  He drinks alcohol rarely.  He is single and lives with his girlfriend.  He reports that he had anxiety when he needed an MRI last year for his gallbladder.   ?He has a longstanding history of trouble sleeping.  He is currently on Seroquel for sleep.  ?I reviewed the office note from 63/27/2023.  He had blood work through your office and I reviewed the results: CBC with differential showed elevated hemoglobin at 17.6, hematocrit borderline elevated at 52.6, CMP with benign findings, alk phos 69, AST 18, ALT 26, A1c 6.0, lipid panel showed borderline total cholesterol at 202, triglycerides 199, HDL 39.3, LDL 123, TSH normal at 1.82, vitamin B12 497, folate 8.3, vitamin D below normal at 28.48.  Iron studies normal with total iron 84, TIBC 322, ferritin 69, PSA normal at 2.09. ? ?His Past Medical History Is Significant For: ?Past Medical History:  ?Diagnosis Date  ? Allergy 1970  ? Anxiety 2021  ? Asthma 2020  ? Cataract 2000  ? COPD (chronic obstructive pulmonary disease) ([place])   ? GERD (gastroesophageal reflux disease) 2020  ? Heart murmur 1970  ? Palpitations   ? ? ?His Past Surgical History Is Significant For: ?Past Surgical History:  ?Procedure  Laterality Date  ? CHOLECYSTECTOMY N/A 07/14/2021  ? Procedure: LAPAROSCOPIC CHOLECYSTECTOMY;  Surgeon: Dwan Bolt, MD;  Location: [facility];  Service: General;  Laterality: N/A;  ? COLONOSCOPY  1994  ? Dr. Fuller Plan; hyperplastic polyps  ? COLONOSCOPY WITH PROPOFOL N/A 12/13/2021  ? Procedure: COLONOSCOPY WITH PROPOFOL;  Surgeon: Eloise Harman, DO;  [facility];  Service: Endoscopy;  Laterality: N/A;  11:00am  ? POLYPECTOMY  12/13/2021  ? Procedure: POLYPECTOMY;  Surgeon: Eloise Harman, DO;  [facility];  Service: Endoscopy;;  ? skin grafts    ? TONSILLECTOMY    ? ? ?His Family History Is Significant For: ?Family History  ?Adopted: Yes  ?Problem  Relation Yates of Onset  ? Colon cancer Brother   ?     diagnosed in early 56s  ? ? ?His Social History Is Significant For: ?Social History  ? ?Socioeconomic History  ? Marital status: Divorced  ?  Spouse name: has a girlfriend  ? Number of children: [name]  ? Years of education: [name]  ? Highest education level: [name]  ?Occupational History  ? Occupation: farmer  ?Tobacco Use  ? Smoking status: Every Day  ?  Packs/day: 1.00  ?  Types: Cigarettes  ?  Start date: 11/07/1974  ? Smokeless tobacco: Never  ? Tobacco comments:  ?  have been smoking for several yrs nothing seems to help me stop  ?Vaping Use  ? Vaping Use: Never used  ?Substance and Sexual Activity  ? Alcohol use: Yes  ?  Comment: very seldom use of alcohol  ? Drug use: Never  ?  Comment: Nothing in 30 years  ? Sexual activity: Yes  ?Other Topics Concern  ? [name]  ?Social History Narrative  ? 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  ?   ? Retired 2022  ? Right handed  ? Caffeine: 1 cup/day of decaf   ? ?Social Determinants of Health  ? ?Financial Resource Strain: [name]  ?Food Insecurity: [name]  ?Transportation Needs: [name]  ?Physical Activity: [name]  ?Stress: [name]  ?Social Connections: [name]  ? ? ?His Allergies Are:  ?Allergies  ?Allergen Reactions  ? Sulfa Antibiotics Rash  ?:  ? ?His Current Medications Are:  ?Outpatient Encounter Medications as of 02/21/2022  ?Medication Sig  ? acetaminophen (TYLENOL) 500 MG tablet Take 1,000 mg by mouth every 6 (six) hours as needed (pain/headaches).  ? fluticasone furoate-vilanterol (BREO ELLIPTA) 100-25 MCG/ACT AEPB Inhale 1 puff into the lungs daily.  ? Ibuprofen-diphenhydrAMINE HCl (IBUPROFEN PM) 200-25 MG CAPS Take 2 tablets by mouth at bedtime.  ? metoprolol tartrate (LOPRESSOR) 25 MG tablet Take 0.5 tablets (12.5 mg total) by mouth 2 (two) times daily.  ? QUEtiapine (SEROQUEL)  100 MG tablet TAKE 1/2 TO 1 TABLET(50 TO 100 MG) BY MOUTH AT BEDTIME  ? ?No facility-administered encounter medications on file as of 02/21/2022.  ?: ? ? ?Review of Systems:  ?Out of a complete 14 point review of systems, all are reviewed and negative with the exception of these symptoms as listed below: ? ? ?Review of Systems  ?Neurological:   ?     Patient is here alone for evaluation of forgetfulness. He was also referred for tremor but he is more concerned about forgetfulness. He also reports new anxiety since retirement 52  months ago. He mainly gets anxious while driving. He has had a couple of panic attacks. He states he twiddles his thumbs a lot. He has to write things down on a sticky note or else he's afraid he will forget appointments, etc. He states primary care saw his tremors in his hands. MMSE 30/30 Animals 14.  ? ?Objective:  ?Neurological Exam ? ?Physical Exam ?Physical Examination:  ? ?Vitals:  ? 02/21/22 1444  ?BP: 122/77  ?Pulse: 87  ? ? ?General Examination: The patient is a very pleasant 61 y.o. male in no acute distress. He appears well-developed and well-nourished and well groomed.  He appears mildly anxious and fidgety. ? ?HEENT: Normocephalic, atraumatic, pupils are equal, round and reactive to light, extraocular tracking is good without limitation to gaze excursion or nystagmus noted. Hearing is grossly intact. Face is symmetric with normal facial animation. Speech is clear with no dysarthria noted. There is no hypophonia. There is no lip, neck/head, jaw or voice tremor. Neck is supple with full range of passive and active motion. There are no carotid bruits on auscultation. Oropharynx exam reveals: moderate mouth dryness, marginal dental hygiene with mostly edentulous state on the top, and partial dentures on the bottom. Tongue protrudes centrally and palate elevates symmetrically.  ? ?Chest: Clear to auscultation without wheezing, rhonchi or crackles noted. ? ?Heart: S1+S2+0, regular and  normal without murmurs, rubs or gallops noted.  ? ?Abdomen: Soft, non-tender and non-distended with normal bowel sounds appreciated on auscultation. ? ?Extremities: There is no pitting edema in the distal

## 2022-03-05 ENCOUNTER — Other Ambulatory Visit: Payer: Self-pay | Admitting: Registered Nurse

## 2022-03-05 DIAGNOSIS — G479 Sleep disorder, unspecified: Secondary | ICD-10-CM

## 2022-04-04 ENCOUNTER — Other Ambulatory Visit: Payer: Self-pay | Admitting: Registered Nurse

## 2022-04-04 ENCOUNTER — Other Ambulatory Visit: Payer: Self-pay

## 2022-04-04 ENCOUNTER — Encounter: Payer: Self-pay | Admitting: Registered Nurse

## 2022-04-04 DIAGNOSIS — G479 Sleep disorder, unspecified: Secondary | ICD-10-CM

## 2022-04-04 DIAGNOSIS — R002 Palpitations: Secondary | ICD-10-CM

## 2022-04-04 MED ORDER — METOPROLOL TARTRATE 25 MG PO TABS: 12.5000 mg | ORAL_TABLET | Freq: Two times a day (BID) | ORAL | 1 refills | Status: DC

## 2022-04-18 ENCOUNTER — Encounter: Payer: Self-pay | Admitting: Registered Nurse

## 2022-05-02 ENCOUNTER — Other Ambulatory Visit: Payer: Self-pay | Admitting: Registered Nurse

## 2022-05-02 DIAGNOSIS — G479 Sleep disorder, unspecified: Secondary | ICD-10-CM

## 2022-06-21 ENCOUNTER — Telehealth: Payer: Self-pay | Admitting: Internal Medicine

## 2022-06-21 NOTE — Telephone Encounter (Signed)
Pt called in and left message on lung screening line. Message was that [facility] is not covered by his new insurance. PT would like to know alternatives.

## 2022-06-22 NOTE — Telephone Encounter (Signed)
Ladies,   Can we run a ticket to see what is covered for patient and new insurance. Patient states Memory Dance is not covered.   Thank you

## 2022-06-23 ENCOUNTER — Other Ambulatory Visit (HOSPITAL_COMMUNITY): Payer: Self-pay

## 2022-06-23 NOTE — Telephone Encounter (Signed)
error 

## 2022-07-07 ENCOUNTER — Encounter: Payer: 59 | Admitting: Registered Nurse

## 2022-07-07 ENCOUNTER — Encounter: Payer: Self-pay | Admitting: Family Medicine

## 2022-07-07 ENCOUNTER — Ambulatory Visit (INDEPENDENT_AMBULATORY_CARE_PROVIDER_SITE_OTHER): Payer: 59 | Admitting: Family Medicine

## 2022-07-07 VITALS — BP 118/80 | HR 85 | Temp 97.6°F | Ht 72.0 in | Wt 172.6 lb

## 2022-07-07 DIAGNOSIS — R7303 Prediabetes: Secondary | ICD-10-CM | POA: Diagnosis not present

## 2022-07-07 DIAGNOSIS — R0789 Other chest pain: Secondary | ICD-10-CM

## 2022-07-07 DIAGNOSIS — R002 Palpitations: Secondary | ICD-10-CM

## 2022-07-07 DIAGNOSIS — E785 Hyperlipidemia, unspecified: Secondary | ICD-10-CM | POA: Diagnosis not present

## 2022-07-07 DIAGNOSIS — G479 Sleep disorder, unspecified: Secondary | ICD-10-CM | POA: Diagnosis not present

## 2022-07-07 DIAGNOSIS — E559 Vitamin D deficiency, unspecified: Secondary | ICD-10-CM

## 2022-07-07 DIAGNOSIS — J449 Chronic obstructive pulmonary disease, unspecified: Secondary | ICD-10-CM

## 2022-07-07 MED ORDER — QUETIAPINE FUMARATE 100 MG PO TABS: ORAL_TABLET | ORAL | 2 refills | Status: DC

## 2022-07-07 MED ORDER — METOPROLOL TARTRATE 25 MG PO TABS: 12.5000 mg | ORAL_TABLET | Freq: Two times a day (BID) | ORAL | 0 refills | Status: DC

## 2022-07-07 NOTE — Patient Instructions (Addendum)
Ok to continue seroquel same dose for now. Will need to establish with new primary care provider in next few months.  Call Dr. Gustavus Bryant office for follow up. Let us know if we can help with smoking cessation.  I recommend taking the metoprolol twice per day. If any return of chest pain be seen right away.   Depending on cholesterol readings, we may need to start a cholesterol medication. I will let you know.   Chest wall pain was likely due to muscle pull last week. If any return of chest pain be seen right away.   Based on call to your dentist today, and review of your chart and previous echocardiogram, I do not see a reason for prophylactic antibiotics prior to dental procedure.  If your dentist does want you to be on antibiotics, I would discuss with them specific reasons and specific antibiotic needed.  Let us know if we can help further.  Return to the clinic or go to the nearest emergency room if any of your symptoms worsen or new symptoms occur.  Chest Wall Pain Chest wall pain is pain in or around the bones and muscles of your chest. Sometimes, an injury causes this pain. Excessive coughing or overuse of arm and chest muscles may also cause chest wall pain. Sometimes, the cause may not be known. This pain may take several weeks or longer to get better. Follow these instructions at home: Managing pain, stiffness, and swelling  If directed, put ice on the painful area: Put ice in a plastic bag. Place a towel between your skin and the bag. Leave the ice on for 20 minutes, 2-3 times per day. Activity Rest as told by your health care provider. Avoid activities that cause pain. These include any activities that use your chest muscles or your abdominal and side muscles to lift heavy items. Ask your health care provider what activities are safe for you. General instructions  Take over-the-counter and prescription medicines only as told by your health care provider. Do not use any products  that contain nicotine or tobacco, such as cigarettes, e-cigarettes, and chewing tobacco. These can delay healing after injury. If you need help quitting, ask your health care provider. Keep all follow-up visits as told by your health care provider. This is important. Contact a health care provider if: You have a fever. Your chest pain becomes worse. You have new symptoms. Get help right away if: You have nausea or vomiting. You feel sweaty or light-headed. You have a cough with mucus from your lungs (sputum) or you cough up blood. You develop shortness of breath. These symptoms may represent a serious problem that is an emergency. Do not wait to see if the symptoms will go away. Get medical help right away. Call your local emergency services (911 in the [place] Do not drive yourself to the hospital. Summary Chest wall pain is pain in or around the bones and muscles of your chest. Depending on the cause, it may be treated with ice, rest, medicines, and avoiding activities that cause pain. Contact a health care provider if you have a fever, worsening chest pain, or new symptoms. Get help right away if you feel light-headed or you develop shortness of breath. These symptoms may be an emergency. This information is not intended to replace advice given to you by your health care provider. Make sure you discuss any questions you have with your health care provider. Document Revised: 12/11/2020 Document Reviewed: 12/11/2020 Elsevier Patient Education  [place].

## 2022-07-07 NOTE — Progress Notes (Signed)
Subjective:  Patient ID: Robert Yates, male    DOB: [birth year]  Age: 61 y.o. MRN: [id number]  CC:  Chief Complaint  Patient presents with   Annual Exam    Pt also wants to establish care    Depression    PHQ9 - 5    HPI Robert Yates presents initially for CPE but other concerns to review today.   Previous primary care provider Maximiano Coss, NP.  Has not yet established with new primary care provider.  Last visit with Delfino Lovett noted in March.  That was actually a new patient appointment as well.  Insomnia discussed, no relief with ibuprofen PM, melatonin.  Started on trazodone 25 to 50 mg nightly as needed with option to increase towards 100 mg. No relief up to 100 mg, Seroquel ordered, 100 mg, half to 1 at bedtime.  Pulmonary, Dr. Melvyn Novas, COPD.  Still smoking.  Treated with Breo Ellipta - not taking daily, but now most days per week. Still smoking - has cut back. Last visit with Dr. Melvyn Novas was 1 year ago. Declines assistance for smoking cessation. Did not tolerate patches, gum. Not interested in other tx.   Neuro, Dr. Rexene Alberts, evaluation in May for memory concerns, forgetfulness.  Fairly benign memory scores at that time, nonfocal exam.  Plan for MRI and neuropsych testing.  Both were declined at that time by patient.  Adequate hydration discussed, along with plan to follow-up to discuss anxiety and stress management, panic attack management, smoking cessation. Feels like symptoms have been stable, and declines further workup for now.   Palpitations Treated with metoprolol once per day. Noted during gallbladder surgery/prep. Born with heart murmur. Has been advised to takes abx prior to dental work. Planned extraction next week. Prior dentis would prescribe abx. Now new dentist.  Echo noted in 06/2020. No significant valve abnormalities.  Hospitalist note 06/23/21 - PVCs, trigeminy. Prior cardiology eval. Treated with metoprolol 12.'5mg'$  BID.  Has had some chest pain last week - noted with  movement, cough or lifting. Felt like pulled muscle. No current CP and feel s better. No recent palpitations.  Only taking 1/2 pill at night - did not want to run out.   Call placed to prior dentist [facility] in [place] - in 2021 - had procedure without abx. Was on abx for infection at time of dental infection in 2019. No known reason for antibiotic prophylaxis.  Lab Results  Component Value Date   TSH 1.82 01/03/2022    Vitamin D deficiency Borderline at 28.48 in March.  Over-the-counter supplementation recommended. Takes otc vit D few days per week. Unknown dose.   Prediabetes: Weight down 2 pounds.  No current meds for diabetes/prediabetes.  No change in thirst or blurry vision.  Lab Results  Component Value Date   HGBA1C 6.0 01/03/2022   Wt Readings from Last 3 Encounters:  07/07/22 172 lb 9.6 oz (78.3 kg)  02/21/22 174 lb (78.9 kg)  01/03/22 174 lb (78.9 kg)   Hyperlipidemia: Slight elevation in March.  No current statin.   The 10-year ASCVD risk score (Arnett DK, et al., 2019) is: 16.2%   Values used to calculate the score:     Age: 35 years     Sex: Male     Is Non-Hispanic African American: No     Diabetic: No     Tobacco smoker: Yes     Systolic Blood Pressure: 540 mmHg     Is BP treated: No  HDL Cholesterol: 39.3 mg/dL     Total Cholesterol: 202 mg/dL  Lab Results  Component Value Date   CHOL 202 (H) 01/03/2022   HDL 39.30 01/03/2022   LDLCALC 123 (H) 01/03/2022   TRIG 199.0 (H) 01/03/2022   CHOLHDL 5 01/03/2022   Lab Results  Component Value Date   ALT 26 01/03/2022   AST 18 01/03/2022   ALKPHOS 69 01/03/2022   BILITOT 0.4 01/03/2022    Positive depression screening: Trazodone, then Seroquel ordered as above for sleep issues.  Earlier this year. '50mg'$  dose working well to keep asleep and no daytime side effects.  Some depression symptoms - past few months after recent move. Adjusting to new location and townhome. Declines need  for counseling or medication for depression at this time.      07/07/2022    1:21 PM 01/03/2022   10:02 AM 12/22/2020    4:10 PM 01/14/2020   10:24 AM 12/16/2019    8:38 AM  Depression screen PHQ 2/9  Decreased Interest 1 3 0 0 0  Down, Depressed, Hopeless 1 0 0 0 0  PHQ - 2 Score 2 3 0 0 0  Altered sleeping 1 3 0    Tired, decreased energy 0 1 0    Change in appetite 1 0 0    Feeling bad or failure about yourself  0 0 0    Trouble concentrating 0 0 0    Moving slowly or fidgety/restless 1 3 0    Suicidal thoughts 0 0 0    PHQ-9 Score 5 10 0    Difficult doing work/chores  Not difficult at all Not difficult at all     Shingrix discussed - he would like to defer   History Patient Active Problem List   Diagnosis Date Noted   Skin lesion of face 01/03/2022   Sleep disturbance 01/03/2022   Tremor 01/03/2022   History of colon polyps 11/10/2021   Alternating constipation and diarrhea 11/10/2021   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   Calculus of gallbladder without cholecystitis without obstruction    Tobacco abuse    RUQ pain    Elevated LFTs    Calculus of gallbladder with biliary obstruction but without cholecystitis    Acute cholecystitis 06/23/2021   Cholecystitis 06/23/2021   Common bile duct (CBD) obstruction    Cigarette smoker 06/10/2021   Educated about COVID-19 virus infection 06/04/2020   Palpitations 06/04/2020   COPD  GOLD at least 2  / active smoking  12/16/2019   Past Medical History:  Diagnosis Date   Allergy 1970   Anxiety 2021   Asthma 2020   Cataract 2000   COPD (chronic obstructive pulmonary disease) (HCC)    GERD (gastroesophageal reflux disease) 2020   Heart murmur 1970   Palpitations    Past Surgical History:  Procedure Laterality Date   CHOLECYSTECTOMY N/A 07/14/2021   Procedure: LAPAROSCOPIC CHOLECYSTECTOMY;  Surgeon: Dwan Bolt, MD;  Location: [facility];  Service: General;  Laterality: N/A;   COLONOSCOPY  1994   Dr. Fuller Plan; hyperplastic polyps    COLONOSCOPY WITH PROPOFOL N/A 12/13/2021   Procedure: COLONOSCOPY WITH PROPOFOL;  Surgeon: Eloise Harman, DO;  [facility];  Service: Endoscopy;  Laterality: N/A;  11:00am   POLYPECTOMY  12/13/2021   Procedure: POLYPECTOMY;  Surgeon: Eloise Harman, DO;  [facility];  Service: Endoscopy;;   skin grafts     TONSILLECTOMY     Allergies  Allergen Reactions  Sulfa Antibiotics Rash   Prior to Admission medications   Medication Sig Start Date End Date Taking? Authorizing Provider  acetaminophen (TYLENOL) 500 MG tablet Take 1,000 mg by mouth every 6 (six) hours as needed (pain/headaches).   Yes [provider]  fluticasone furoate-vilanterol (BREO ELLIPTA) 100-25 MCG/ACT AEPB Inhale 1 puff into the lungs daily. 11/22/21  Yes Tanda Rockers, MD  Ibuprofen-diphenhydrAMINE HCl (IBUPROFEN PM) 200-25 MG CAPS Take 2 tablets by mouth at bedtime.   Yes [provider]  metoprolol tartrate (LOPRESSOR) 25 MG tablet Take 0.5 tablets (12.5 mg total) by mouth 2 (two) times daily. 04/04/22  Yes Maximiano Coss, NP  QUEtiapine (SEROQUEL) 100 MG tablet TAKE 1/2 TO 1 TABLET(50 TO 100 MG) BY MOUTH AT BEDTIME 05/02/22  Yes Maximiano Coss, NP   Social History   Socioeconomic History   Marital status: Divorced    Spouse name: has a girlfriend   Number of children: [name]   Years of education: [name]   Highest education level: [name]  Occupational History   Occupation: farmer  Tobacco Use   Smoking status: Every Day    Packs/day: 1.00    Types: Cigarettes    Start date: 11/07/1974   Smokeless tobacco: Never   Tobacco comments:    have been smoking for several yrs nothing seems to help me stop  Vaping Use   Vaping Use: Never used  Substance and Sexual Activity   Alcohol use: Yes    Comment: very seldom use of alcohol   Drug use: Never    Comment: Nothing in 30 years   Sexual activity: Yes  Other Topics Concern   [name]  Social  History Narrative   Divorced for 20 years,married for 13 years previously.Lives with girlfriend of 2 years.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      Retired 2022   Right handed   Caffeine: 1 cup/day of decaf    Social Determinants of Radio broadcast assistant Strain: [name]  Food Insecurity: [name]  Transportation Needs: [name]  Physical Activity: [name]  Stress: [name]  Social Connections: [name]  Intimate Partner Violence: [name]    Review of Systems  Per HPI.  Objective:   Vitals:   07/07/22 1323  BP: 118/80  Pulse: 85  Temp: 97.6 F (36.4 C)  SpO2: 98%  Weight: 172 lb 9.6 oz (78.3 kg)  Height: 6' (1.829 m)    Physical Exam Vitals reviewed.  Constitutional:      Appearance: He is well-developed.  HENT:     Head: Normocephalic and atraumatic.  Neck:     Vascular: No carotid bruit or JVD.  Cardiovascular:     Rate and Rhythm: Normal rate and regular rhythm.     Heart sounds: Normal heart sounds. No murmur heard.    Comments: Minimal discomfort in the lateral chest wall, latissimus area with left arm elevation, lower to previous discomfort but mild.  Otherwise no pain currently. Pulmonary:     Effort: Pulmonary effort is normal.     Breath sounds: Normal breath sounds. No rales.  Musculoskeletal:     Right lower leg: No edema.     Left lower leg: No edema.  Skin:    General: Skin is warm and dry.  Neurological:     Mental Status: He is alert and oriented to person, place, and time.  Psychiatric:  Mood and Affect: Mood normal.      58 minutes spent during visit, including chart review, review of other specialist notes, previous PCP plan and medications, additional questions regarding concerns above including antibiotic prophylaxis, counseling and assimilation of information, exam, discussion of plan, and chart completion.    Assessment & Plan:  MAXMILIAN TROSTEL is a 62 y.o. male . Prediabetes - Plan: Hemoglobin  A1c  -Weight improved, continue to watch diet, exercise, check A1c.  Palpitations - Plan: metoprolol tartrate (LOPRESSOR) 25 MG tablet  -Overall stable, twice daily dosing discussed for metoprolol.  Refill ordered.  -Discussed prophylactic antibiotics for dental procedures, and I do not see a specific reason for that at this time.  Clarified with previous dentist that he was not on specific antibiotic prophylaxis, but it sounds like he was treated with antibiotics for infection at that time.  No antibiotics prescribed at this time.  Sleep disturbance - Plan: QUEtiapine (SEROQUEL) 100 MG tablet  -Stable with 50 mg Seroquel.  Concern for previous anxiety symptoms panic symptoms, memory concern, forgetfulness.  Declines further work-up at this time.  Can be discussed with new PCP.  RTC precautions if worsening or would like to discuss other treatment/symptoms further.  Hyperlipidemia, unspecified hyperlipidemia type - Plan: Comprehensive metabolic panel, Lipid panel  -Mild elevation previously but did have elevated ASCVD risk score.  Repeat labs and medication recommendations based on results.  Vitamin D deficiency - Plan: Vitamin D (25 hydroxy)  -Check updated levels and adjust medication/recommendations accordingly  COPD mixed type ([place])  -Smoking cessation discussed, commended on cutting back.  Declined treatments for smoking cessation.  Recommended follow-up with pulmonary, continue Breo.  Chest wall pain  -  Based on descriptions, appears to have been muscular pain, chest wall pain, and now resolved.  Minimal discomfort with arm elevation in 1 area as above but overall much improved.  Given his health history and other comorbidities, recommended ER evaluation if any return of chest pain.  Understanding expressed.  Meds ordered this encounter  Medications   metoprolol tartrate (LOPRESSOR) 25 MG tablet    Sig: Take 0.5 tablets (12.5 mg total) by mouth 2 (two) times daily.    Dispense:  180  tablet    Refill:  0   QUEtiapine (SEROQUEL) 100 MG tablet    Sig: TAKE 1/2 TO 1 TABLET(50 TO 100 MG) BY MOUTH AT BEDTIME    Dispense:  30 tablet    Refill:  2   Patient Instructions  Ok to continue seroquel same dose for now. Will need to establish with new primary care provider in next few months.  Call Dr. Gustavus Bryant office for follow up. Let us know if we can help with smoking cessation.  I recommend taking the metoprolol twice per day. If any return of chest pain be seen right away.   Depending on cholesterol readings, we may need to start a cholesterol medication. I will let you know.   Chest wall pain was likely due to muscle pull last week. If any return of chest pain be seen right away.   Based on call to your dentist today, and review of your chart and previous echocardiogram, I do not see a reason for prophylactic antibiotics prior to dental procedure.  If your dentist does want you to be on antibiotics, I would discuss with them specific reasons and specific antibiotic needed.  Let us know if we can help further.  Return to the clinic or go to the nearest  emergency room if any of your symptoms worsen or new symptoms occur.  Chest Wall Pain Chest wall pain is pain in or around the bones and muscles of your chest. Sometimes, an injury causes this pain. Excessive coughing or overuse of arm and chest muscles may also cause chest wall pain. Sometimes, the cause may not be known. This pain may take several weeks or longer to get better. Follow these instructions at home: Managing pain, stiffness, and swelling  If directed, put ice on the painful area: Put ice in a plastic bag. Place a towel between your skin and the bag. Leave the ice on for 20 minutes, 2-3 times per day. Activity Rest as told by your health care provider. Avoid activities that cause pain. These include any activities that use your chest muscles or your abdominal and side muscles to lift heavy items. Ask your  health care provider what activities are safe for you. General instructions  Take over-the-counter and prescription medicines only as told by your health care provider. Do not use any products that contain nicotine or tobacco, such as cigarettes, e-cigarettes, and chewing tobacco. These can delay healing after injury. If you need help quitting, ask your health care provider. Keep all follow-up visits as told by your health care provider. This is important. Contact a health care provider if: You have a fever. Your chest pain becomes worse. You have new symptoms. Get help right away if: You have nausea or vomiting. You feel sweaty or light-headed. You have a cough with mucus from your lungs (sputum) or you cough up blood. You develop shortness of breath. These symptoms may represent a serious problem that is an emergency. Do not wait to see if the symptoms will go away. Get medical help right away. Call your local emergency services (911 in the [place] Do not drive yourself to the hospital. Summary Chest wall pain is pain in or around the bones and muscles of your chest. Depending on the cause, it may be treated with ice, rest, medicines, and avoiding activities that cause pain. Contact a health care provider if you have a fever, worsening chest pain, or new symptoms. Get help right away if you feel light-headed or you develop shortness of breath. These symptoms may be an emergency. This information is not intended to replace advice given to you by your health care provider. Make sure you discuss any questions you have with your health care provider. Document Revised: 12/11/2020 Document Reviewed: 12/11/2020 Elsevier Patient Education  [place],   Merri Ray, MD [place], [facility] 07/07/22 1:44 PM

## 2022-07-08 LAB — COMPREHENSIVE METABOLIC PANEL
ALT: 27 U/L (ref 0–53)
AST: 21 U/L (ref 0–37)
Albumin: 4.6 g/dL (ref 3.5–5.2)
Alkaline Phosphatase: 66 U/L (ref 39–117)
BUN: 10 mg/dL (ref 6–23)
CO2: 27 mEq/L (ref 19–32)
Calcium: 9.9 mg/dL (ref 8.4–10.5)
Chloride: 101 mEq/L (ref 96–112)
Creatinine, Ser: 0.9 mg/dL (ref 0.40–1.50)
GFR: 90.79 mL/min (ref >60.00)
Glucose, Bld: 66 mg/dL — ABNORMAL LOW (ref 70–99)
Potassium: 4.3 mEq/L (ref 3.5–5.1)
Sodium: 138 mEq/L (ref 135–145)
Total Bilirubin: 0.3 mg/dL (ref 0.2–1.2)
Total Protein: 7.4 g/dL (ref 6.0–8.3)

## 2022-07-08 LAB — LIPID PANEL
Cholesterol: 214 mg/dL — ABNORMAL HIGH (ref 0–200)
HDL: 36.5 mg/dL — ABNORMAL LOW (ref >39.00)
NonHDL: 177.3
Total CHOL/HDL Ratio: 6
Triglycerides: 340 mg/dL — ABNORMAL HIGH (ref 0.0–149.0)
VLDL: 68 mg/dL — ABNORMAL HIGH (ref 0.0–40.0)

## 2022-07-08 LAB — VITAMIN D 25 HYDROXY (VIT D DEFICIENCY, FRACTURES): VITD: 38.86 ng/mL (ref 30.00–100.00)

## 2022-07-08 LAB — LDL CHOLESTEROL, DIRECT: Direct LDL: 148 mg/dL

## 2022-07-08 LAB — HEMOGLOBIN A1C: Hgb A1c MFr Bld: 6.1 % (ref 4.6–6.5)

## 2022-07-11 ENCOUNTER — Encounter: Payer: Self-pay | Admitting: Family Medicine

## 2022-07-12 NOTE — Telephone Encounter (Signed)
Patient calling back regarding Breo not being covered by insurance.  Questioning alternatives.  Please advise.  [phone].

## 2022-07-13 NOTE — Telephone Encounter (Signed)
Called [facility] about pt's Breo to see if they were able to get pt's [facility] Rx ready for pt. Was told that pt's new insurance [employer] is not contracted with [facility] so pt would need to switch pharmacies. [facility] if having [employer].  Attempted to call pt to let him know this info so we could see which [facility] [place] [facility] to but unable to reach. Left message for him to return call.

## 2022-07-14 ENCOUNTER — Other Ambulatory Visit: Payer: Self-pay

## 2022-07-14 DIAGNOSIS — G479 Sleep disorder, unspecified: Secondary | ICD-10-CM

## 2022-07-14 DIAGNOSIS — R002 Palpitations: Secondary | ICD-10-CM

## 2022-07-14 MED ORDER — FLUTICASONE FUROATE-VILANTEROL 100-25 MCG/ACT IN AEPB: 1.0000 | INHALATION_SPRAY | Freq: Every day | RESPIRATORY_TRACT | 11 refills | Status: DC

## 2022-07-14 MED ORDER — METOPROLOL TARTRATE 25 MG PO TABS: 12.5000 mg | ORAL_TABLET | Freq: Two times a day (BID) | ORAL | 0 refills | Status: DC

## 2022-07-14 MED ORDER — QUETIAPINE FUMARATE 100 MG PO TABS: ORAL_TABLET | ORAL | 2 refills | Status: DC

## 2022-07-14 NOTE — Telephone Encounter (Signed)
Called and spoke with patient. He verbalized understanding. [facility] so I was able to find one for him. RX has been sent.   Nothing further needed at time of call.

## 2022-07-14 NOTE — Telephone Encounter (Signed)
Pt returning call.  [phone]

## 2022-07-18 ENCOUNTER — Encounter: Payer: Self-pay | Admitting: Family Medicine

## 2022-07-18 DIAGNOSIS — E785 Hyperlipidemia, unspecified: Secondary | ICD-10-CM

## 2022-07-21 MED ORDER — ATORVASTATIN CALCIUM 10 MG PO TABS: 10.0000 mg | ORAL_TABLET | Freq: Every day | ORAL | 0 refills | Status: DC

## 2022-07-21 NOTE — Telephone Encounter (Signed)
Patient agreed to low dose statin therapy and would [facility] [address] and [address]

## 2022-07-28 ENCOUNTER — Ambulatory Visit (INDEPENDENT_AMBULATORY_CARE_PROVIDER_SITE_OTHER): Payer: 59 | Admitting: Internal Medicine

## 2022-07-28 ENCOUNTER — Encounter: Payer: Self-pay | Admitting: Internal Medicine

## 2022-07-28 VITALS — BP 106/67 | HR 79 | Temp 98.2°F | Resp 14 | Ht 72.0 in | Wt 173.2 lb

## 2022-07-28 DIAGNOSIS — M545 Low back pain, unspecified: Secondary | ICD-10-CM

## 2022-07-28 DIAGNOSIS — L989 Disorder of the skin and subcutaneous tissue, unspecified: Secondary | ICD-10-CM

## 2022-07-28 DIAGNOSIS — F17210 Nicotine dependence, cigarettes, uncomplicated: Secondary | ICD-10-CM

## 2022-07-28 DIAGNOSIS — M5126 Other intervertebral disc displacement, lumbar region: Secondary | ICD-10-CM | POA: Diagnosis not present

## 2022-07-28 DIAGNOSIS — G4700 Insomnia, unspecified: Secondary | ICD-10-CM

## 2022-07-28 DIAGNOSIS — R198 Other specified symptoms and signs involving the digestive system and abdomen: Secondary | ICD-10-CM | POA: Diagnosis not present

## 2022-07-28 DIAGNOSIS — E785 Hyperlipidemia, unspecified: Secondary | ICD-10-CM | POA: Diagnosis not present

## 2022-07-28 DIAGNOSIS — R7303 Prediabetes: Secondary | ICD-10-CM

## 2022-07-28 DIAGNOSIS — R69 Illness, unspecified: Secondary | ICD-10-CM | POA: Diagnosis not present

## 2022-07-28 DIAGNOSIS — R011 Cardiac murmur, unspecified: Secondary | ICD-10-CM

## 2022-07-28 DIAGNOSIS — Z23 Encounter for immunization: Secondary | ICD-10-CM | POA: Diagnosis not present

## 2022-07-28 DIAGNOSIS — D369 Benign neoplasm, unspecified site: Secondary | ICD-10-CM | POA: Diagnosis not present

## 2022-07-28 DIAGNOSIS — R251 Tremor, unspecified: Secondary | ICD-10-CM

## 2022-07-28 DIAGNOSIS — M549 Dorsalgia, unspecified: Secondary | ICD-10-CM | POA: Insufficient documentation

## 2022-07-28 HISTORY — DX: Prediabetes: R73.03

## 2022-07-28 HISTORY — DX: Hyperlipidemia, unspecified: E78.5

## 2022-07-28 HISTORY — DX: Insomnia, unspecified: G47.00

## 2022-07-28 NOTE — Assessment & Plan Note (Signed)
He says this problem only flares when he jarred his low back and requires muscle relaxers and strong anti-inflammatories typically to get back on track I assured him I would remain available to assist with this

## 2022-07-28 NOTE — Assessment & Plan Note (Signed)
Urged him to keep taking the atorvastatin daily and assured him it would reduce his risk of heart attacks caused by smoking even although with a 14.9% 10-year risk of cardiovascular disease it would get a bigger drop off if he is able to completely quit smoking may be down to under 5%

## 2022-07-28 NOTE — Assessment & Plan Note (Signed)
I added prediabetes to his problem list today and advised him that although he does not have full diabetes that if he does not limit carbohydrates particularly starches that he could get full diabetes and that would if further increase his risk of heart disease

## 2022-07-28 NOTE — Progress Notes (Signed)
Today's healthcare provider: [name], MD  Phone: [phone]  New patient visit  Visit Date: 07/28/2022 Patient: Robert Yates   DOB: 1961-09-20   61 y.o. Male  MRN: [id number]  Assessment and Plan:    Updated his problem list today as follows and getting to know him but we really did not do any specific problem focused care in detail and because he had no specific concerns.  Robert Yates was seen today for transfer of care.  Need for influenza vaccination -     Flu Vaccine QUAD 49moIM (Fluarix, Fluzone & Alfiuria Quad PF)  Hyperlipidemia, unspecified hyperlipidemia type Overview: The 10-year ASCVD risk score (Arnett DK, et al., 2019) is: 14.9%   Values used to calculate the score:     Age: [age]years     Sex: Male     Is Non-Hispanic African American: No     Diabetic: No     Tobacco smoker: Yes     Systolic Blood Pressure: 1833mmHg     Is BP treated: No     HDL Cholesterol: 36.5 mg/dL     Total Cholesterol: 214 mg/dL   Started atorvastatin 07/2022  Assessment & Plan: Urged him to keep taking the atorvastatin daily and assured him it would reduce his risk of heart attacks caused by smoking even although with a 14.9% 10-year risk of cardiovascular disease it would get a bigger drop off if he is able to completely quit smoking may be down to under 5%   Need for immunization against influenza -     Flu Vaccine QUAD 653moM (Fluarix, Fluzone & Alfiuria Quad PF)  Insomnia, unspecified type Overview: - he doing much better with seroquel 50-100 nightly, failed many other meds before "only thing that ever worked"   Alternating constipation and diarrhea Overview: Persist even after gb removal   Multiple adenomatous polyps Overview: Advised yearly colonoscopy after 12/13/2021 benign path- due 2024   Midline low back pain without sciatica, unspecified chronicity Overview: Ruptured disc x2 Intermittent flares due to strains.  Assessment & Plan: He says this problem  only flares when he jarred his low back and requires muscle relaxers and strong anti-inflammatories typically to get back on track I assured him I would remain available to assist with this   Cigarette smoker Overview: He has been cutting back he is down to 1 pack a day but has smoked as much is 2-1/2 packs in a day in the past on a stressful day but probably never more than 2 a day on average.  Started smoking at age 61 Reports he is out of date on yearly CAT scan screening  Assessment & Plan: Follows with the lung doctor and so declines for me to order LDCT today but he has had his history of -1 in the past and denies any recent coughing up of blood or weight loss unintentional He identifies having a living in a location where he has to smoke outside as beneficial and so I encouraged him to stick to the smoking outside rule and not try to break   Prediabetes Overview: Lab Results  Component Value Date   HGBA1C 6.1 07/07/2022   HGBA1C 6.0 01/03/2022   HGBA1C 5.7 (H) 12/16/2019     Assessment & Plan: I added prediabetes to his problem list today and advised him that although he does not have full diabetes that if he does not limit carbohydrates particularly starches that he could get full diabetes and that would if  further increase his risk of heart disease   Skin lesion of face Overview: Been reviewed by dermatologist and they are not concerning just hereditary   Tremor Overview: Is a very faint bilateral hand tremor that does not bother him and he does not take medicine for   Ruptured lumbar disc Overview: 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   Murmur Overview: Born with  Had echo, not concerning        Health Maintenance  Topic Date Due   Zoster Vaccines- Shingrix (1 of 2) 09/08/2022 (Originally 11/07/2008)   COVID-19 Vaccine (2 - Booster for Janssen series) 10/09/2022 (Originally 03/19/2020)   Hepatitis C Screening  07/08/2023 (Originally  11/07/1976)   TETANUS/TDAP  12/15/2029   COLONOSCOPY (Pts 45-62yr Insurance coverage will need to be confirmed)  12/14/2031   INFLUENZA VACCINE  Completed   HIV Screening  Completed   HPV VACCINES  Aged Out       Recommended follow up: Return in about 6 months (around 01/27/2023) for check cholesterol response, cdm.      Subjective:  Patient presents today to establish care.  Prior patient of Dr. GCarlota Raspberrybut reports he couldn't be taken on as a primary care patient there due to availability..   Was following with MOrland Mustardthen GCarlota Raspberryand they took care of all of his issues already... so this is just a transfer of care.  He started lipitor on Saturday and no muscle aches..  Prefers to not have to be nursed to keep living in future.    Chief Complaint  Patient presents with   Transfer of care    No concerns.    For history taking, I took a per problem history from the patient and chart review as follows: Problem  Back Pain   Ruptured disc x2 Intermittent flares due to strains.   Insomnia   - he doing much better with seroquel 50-100 nightly, failed many other meds before "only thing that ever worked"   Multiple Adenomatous Polyps   Advised yearly colonoscopy after 12/13/2021 benign path- due 2024   Hyperlipidemia   The 10-year ASCVD risk score (Arnett DK, et al., 2019) is: 14.9%   Values used to calculate the score:     Age: [age]years     Sex: Male     Is Non-Hispanic African American: No     Diabetic: No     Tobacco smoker: Yes     Systolic Blood Pressure: 1161mmHg     Is BP treated: No     HDL Cholesterol: 36.5 mg/dL     Total Cholesterol: 214 mg/dL   Started atorvastatin 07/2022   Prediabetes   Lab Results  Component Value Date   HGBA1C 6.1 07/07/2022   HGBA1C 6.0 01/03/2022   HGBA1C 5.7 (H) 12/16/2019      Ruptured Lumbar Disc   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   Murmur   Born with  Had echo, not concerning   Skin Lesion of Face    Been reviewed by dermatologist and they are not concerning just hereditary   Tremor   Is a very faint bilateral hand tremor that does not bother him and he does not take medicine for   Alternating Constipation and Diarrhea   Persist even after gb removal   Cigarette Smoker   He has been cutting back he is down to 1 pack a day but has smoked as much is 2-1/2 packs in a day in the past on  a stressful day but probably never more than 2 a day on average.  Started smoking at age 76.  Reports he is out of date on yearly CAT scan screening   Sleep Disturbance (Resolved)  Abdominal Pain (Resolved)  Calculus of Gallbladder Without Cholecystitis Without Obstruction (Resolved)  Ruq Pain (Resolved)  Calculus of Gallbladder With Biliary Obstruction But Without Cholecystitis (Resolved)  Acute Cholecystitis (Resolved)  Cholecystitis (Resolved)  Common Bile Duct (Cbd) Obstruction (Resolved)  Educated About Covid-19 Virus Infection (Resolved)     Depression Screen    07/07/2022    1:21 PM 01/03/2022   10:02 AM 12/22/2020    4:10 PM 01/14/2020   10:24 AM  PHQ 2/9 Scores  PHQ - 2 Score 2 3 0 0  PHQ- 9 Score 5 10 0    No results found for any visits on 07/28/22.   The following were reviewed and entered/updated in epic: Past Medical History:  Diagnosis Date   Allergy 1970   Anxiety 2021   Asthma 2020   Cataract 2000   COPD (chronic obstructive pulmonary disease) (HCC)    GERD (gastroesophageal reflux disease) 2020   Heart murmur 1970   Hyperlipidemia 07/28/2022   The 10-year ASCVD risk score (Arnett DK, et al., 2019) is: 14.9%   Values used to calculate the score:     Age: 68 years     Sex: Male     Is Non-Hispanic African American: No     Diabetic: No     Tobacco smoker: Yes     Systolic Blood Pressure: 852 mmHg     Is BP treated: No     HDL Cholesterol: 36.5 mg/dL     Total Cholesterol: 214 mg/dL   Started atorvastatin 07/2022   Insomnia 07/28/2022   - he doing much better with seroquel  50-100 nightly, failed many other meds before "only thing that ever worked"   Palpitations    Prediabetes 07/28/2022   Lab Results Component Value Date  HGBA1C 6.1 07/07/2022  HGBA1C 6.0 01/03/2022  HGBA1C 5.7 (H) 12/16/2019     Past Surgical History:  Procedure Laterality Date   CHOLECYSTECTOMY N/A 07/14/2021   Procedure: LAPAROSCOPIC CHOLECYSTECTOMY;  Surgeon: Dwan Bolt, MD;  Location: [facility];  Service: General;  Laterality: N/A;   COLONOSCOPY  1994   Dr. Fuller Plan; hyperplastic polyps   COLONOSCOPY WITH PROPOFOL N/A 12/13/2021   Procedure: COLONOSCOPY WITH PROPOFOL;  Surgeon: Eloise Harman, DO;  [facility];  Service: Endoscopy;  Laterality: N/A;  11:00am   POLYPECTOMY  12/13/2021   Procedure: POLYPECTOMY;  Surgeon: Eloise Harman, DO;  [facility];  Service: Endoscopy;;   skin grafts     TONSILLECTOMY     Past Surgical History:  Procedure Laterality Date   CHOLECYSTECTOMY N/A 07/14/2021   Procedure: LAPAROSCOPIC CHOLECYSTECTOMY;  Surgeon: Dwan Bolt, MD;  Location: [facility];  Service: General;  Laterality: N/A;   COLONOSCOPY  1994   Dr. Fuller Plan; hyperplastic polyps   COLONOSCOPY WITH PROPOFOL N/A 12/13/2021   Procedure: COLONOSCOPY WITH PROPOFOL;  Surgeon: Eloise Harman, DO;  [facility];  Service: Endoscopy;  Laterality: N/A;  11:00am   POLYPECTOMY  12/13/2021   Procedure: POLYPECTOMY;  Surgeon: Eloise Harman, DO;  [facility];  Service: Endoscopy;;   skin grafts     TONSILLECTOMY     Family Status  Relation Name Status   Mother  Deceased   Father  Deceased   Brother  Alive   Family History  Adopted: Yes  Problem Relation Age of Onset   Colon cancer Brother        diagnosed in early 10s   Outpatient Medications Prior to Visit  Medication Sig Dispense Refill   acetaminophen (TYLENOL) 500 MG tablet Take 1,000 mg by mouth every 6 (six) hours as needed (pain/headaches).     amoxicillin (AMOXIL) 875 MG  tablet SMARTSIG:1 Tablet(s) By Mouth Every 12 Hours     atorvastatin (LIPITOR) 10 MG tablet Take 1 tablet (10 mg total) by mouth daily. 90 tablet 0   fluticasone furoate-vilanterol (BREO ELLIPTA) 100-25 MCG/ACT AEPB Inhale 1 puff into the lungs daily. 60 each 11   Ibuprofen-diphenhydrAMINE HCl (IBUPROFEN PM) 200-25 MG CAPS Take 2 tablets by mouth at bedtime.     metoprolol tartrate (LOPRESSOR) 25 MG tablet Take 0.5 tablets (12.5 mg total) by mouth 2 (two) times daily. 180 tablet 0   QUEtiapine (SEROQUEL) 100 MG tablet TAKE 1/2 TO 1 TABLET(50 TO 100 MG) BY MOUTH AT BEDTIME 30 tablet 2   No facility-administered medications prior to visit.    Allergies  Allergen Reactions   Sulfa Antibiotics Rash   Social History   Tobacco Use   Smoking status: Every Day    Packs/day: 1.00    Types: Cigarettes    Start date: 11/07/1974   Smokeless tobacco: Never   Tobacco comments:    have been smoking for several yrs nothing seems to help me stop  Vaping Use   Vaping Use: Never used  Substance Use Topics   Alcohol use: Yes    Comment: very seldom use of alcohol   Drug use: Never    Comment: Nothing in 30 years    Immunization History  Administered Date(s) Administered   Influenza Inj Mdck Quad Pf 07/11/2019   Influenza,inj,Quad PF,6+ Mos 07/28/2022   Influenza-Unspecified 07/25/2021   Janssen (J&J) SARS-COV-2 Vaccination 01/23/2020   Tdap 12/16/2019      Objective:  BP 106/67 (BP Location: Left Arm, Patient Position: Sitting)   Pulse 79   Temp 98.2 F (36.8 C) (Temporal)   Resp 14   Ht 6' (1.829 m)   Wt 173 lb 3.2 oz (78.6 kg)   SpO2 96%   BMI 23.49 kg/m  Body mass index is 23.49 kg/m.  He  is a very cordial and polite person who was a pleasure to meet.  Gen: NAD, resting comfortably HEENT: Mucous membranes are moist. Sclera conjunctiva and lids grossly normal Neck: no thyromegaly, no cervical lymphadenopathy CV: RRR no murmurs rubs or gallops Lungs: CTAB no crackles, wheeze,  rhonchi Ext: no edema Skin: warm, dry Neuro: grossly intact  No images are attached to the encounter or orders placed in the encounter.  Results for orders placed or performed in visit on 07/07/22  Vitamin D (25 hydroxy)  Result Value Ref Range   VITD 38.86 30.00 - 100.00 ng/mL  Comprehensive metabolic panel  Result Value Ref Range   Sodium 138 135 - 145 mEq/L   Potassium 4.3 3.5 - 5.1 mEq/L   Chloride 101 96 - 112 mEq/L   CO2 27 19 - 32 mEq/L   Glucose, Bld 66 (L) 70 - 99 mg/dL   BUN 10 6 - 23 mg/dL   Creatinine, Ser 0.90 0.40 - 1.50 mg/dL   Total Bilirubin 0.3 0.2 - 1.2 mg/dL   Alkaline Phosphatase 66 39 - 117 U/L   AST 21 0 - 37 U/L   ALT 27 0 -  53 U/L   Total Protein 7.4 6.0 - 8.3 g/dL   Albumin 4.6 3.5 - 5.2 g/dL   GFR 90.79 >60.00 mL/min   Calcium 9.9 8.4 - 10.5 mg/dL  Lipid panel  Result Value Ref Range   Cholesterol 214 (H) 0 - 200 mg/dL   Triglycerides 340.0 (H) 0.0 - 149.0 mg/dL   HDL 36.50 (L) >39.00 mg/dL   VLDL 68.0 (H) 0.0 - 40.0 mg/dL   Total CHOL/HDL Ratio 6    NonHDL 177.30   Hemoglobin A1c  Result Value Ref Range   Hgb A1c MFr Bld 6.1 4.6 - 6.5 %  LDL cholesterol, direct  Result Value Ref Range   Direct LDL 148.0 mg/dL

## 2022-07-28 NOTE — Assessment & Plan Note (Signed)
Follows with the lung doctor and so declines for me to order LDCT today but he has had his history of -1 in the past and denies any recent coughing up of blood or weight loss unintentional He identifies having a living in a location where he has to smoke outside as beneficial and so I encouraged him to stick to the smoking outside rule and not try to break

## 2022-07-28 NOTE — Patient Instructions (Addendum)
It was a pleasure seeing you today!  Today the plan is... Nothing special we are just going to try to cut back smoking and we are just getting established as a primary care relationship so I will be available for you in the future for any medical care needs you have.  I updated your problem list and it is below feel free to let me know if there is anything inaccurate  Need for influenza vaccination -     Flu Vaccine QUAD 90moIM (Fluarix, Fluzone & Alfiuria Quad PF)  Hyperlipidemia, unspecified hyperlipidemia type Assessment & Plan: Urged him to keep taking the atorvastatin daily and assured him it would reduce his risk of heart attacks caused by smoking even although with a 14.9% 10-year risk of cardiovascular disease it would get a bigger drop off if he is able to completely quit smoking may be down to under 5%   Need for immunization against influenza -     Flu Vaccine QUAD 678moM (Fluarix, Fluzone & Alfiuria Quad PF)  Insomnia, unspecified type  Alternating constipation and diarrhea  Multiple adenomatous polyps  Midline low back pain without sciatica, unspecified chronicity Assessment & Plan: He says this problem only flares when he jarred his low back and requires muscle relaxers and strong anti-inflammatories typically to get back on track I assured him I would remain available to assist with this   Cigarette smoker Assessment & Plan: Follows with the lung doctor and so declines for me to order LDCT today but he has had his history of -1 in the past and denies any recent coughing up of blood or weight loss unintentional He identifies having a living in a location where he has to smoke outside as beneficial and so I encouraged him to stick to the smoking outside rule and not try to break   Prediabetes Assessment & Plan: I added prediabetes to his problem list today and advised him that although he does not have full diabetes that if he does not limit carbohydrates particularly  starches that he could get full diabetes and that would if further increase his risk of heart disease   Skin lesion of face  Tremor  Ruptured lumbar disc  Murmur     RyLoralee PacasMD   Return in about 6 months (around 01/27/2023) for check cholesterol response, cdm.   - If your condition fails to resolve or you have other questions / concerns: please contact me via phone ([phone] MyChart messaging.  - Please bring all your medicines to your next appointment. This is the best way for me to know exactly what you're taking.

## 2022-08-01 ENCOUNTER — Encounter: Payer: Self-pay | Admitting: Internal Medicine

## 2022-08-02 MED ORDER — ROSUVASTATIN CALCIUM 20 MG PO TABS: 20.0000 mg | ORAL_TABLET | Freq: Every day | ORAL | 3 refills | Status: DC

## 2022-08-18 ENCOUNTER — Other Ambulatory Visit: Payer: Self-pay

## 2022-08-18 DIAGNOSIS — Z87891 Personal history of nicotine dependence: Secondary | ICD-10-CM

## 2022-08-18 DIAGNOSIS — F17210 Nicotine dependence, cigarettes, uncomplicated: Secondary | ICD-10-CM

## 2022-08-18 DIAGNOSIS — Z122 Encounter for screening for malignant neoplasm of respiratory organs: Secondary | ICD-10-CM

## 2022-09-28 ENCOUNTER — Encounter: Payer: Self-pay | Admitting: Acute Care

## 2022-09-28 ENCOUNTER — Ambulatory Visit (INDEPENDENT_AMBULATORY_CARE_PROVIDER_SITE_OTHER): Payer: 59 | Admitting: Acute Care

## 2022-09-28 DIAGNOSIS — R69 Illness, unspecified: Secondary | ICD-10-CM | POA: Diagnosis not present

## 2022-09-28 DIAGNOSIS — F17210 Nicotine dependence, cigarettes, uncomplicated: Secondary | ICD-10-CM | POA: Diagnosis not present

## 2022-09-28 NOTE — Progress Notes (Signed)
Virtual Visit via Telephone Note  I connected with Robert Yates on 09/28/22 at 10:00 AM EST by telephone and verified that I am speaking with the correct person using two identifiers.  Location: Patient:  At home Provider:  [place], [place], [place], [address]    I discussed the limitations, risks, security and privacy concerns of performing an evaluation and management service by telephone and the availability of in person appointments. I also discussed with the patient that there may be a patient responsible charge related to this service. The patient expressed understanding and agreed to proceed.   Shared Decision Making Visit Lung Cancer Screening Program [phone])   Eligibility: Age 61 y.o. Pack Years Smoking History Calculation 71 pack year smoking history (# packs/per year x # years smoked) Recent History of coughing up blood  no Unexplained weight loss? no ( >Than 15 pounds within the last 6 months ) Prior History Lung / other cancer no (Diagnosis within the last 5 years already requiring surveillance chest CT Scans). Smoking Status Current Smoker Former Smokers: Years since quit:  NA  Quit Date:  NA  Visit Components: Discussion included one or more decision making aids. yes Discussion included risk/benefits of screening. yes Discussion included potential follow up diagnostic testing for abnormal scans. yes Discussion included meaning and risk of over diagnosis. yes Discussion included meaning and risk of False Positives. yes Discussion included meaning of total radiation exposure. yes  Counseling Included: Importance of adherence to annual lung cancer LDCT screening. yes Impact of comorbidities on ability to participate in the program. yes Ability and willingness to under diagnostic treatment. yes  Smoking Cessation Counseling: Current Smokers:  Discussed importance of smoking cessation. yes Information about tobacco cessation classes and  interventions provided to patient. yes Patient provided with "ticket" for LDCT Scan. yes Symptomatic Patient. no  Counseling NA Diagnosis Code: Tobacco Use Z72.0 Asymptomatic Patient yes  Counseling (Intermediate counseling: > three minutes counseling) Q2229 Former Smokers:  Discussed the importance of maintaining cigarette abstinence. yes Diagnosis Code: Personal History of Nicotine Dependence. N98.921 Information about tobacco cessation classes and interventions provided to patient. Yes Patient provided with "ticket" for LDCT Scan. yes Written Order for Lung Cancer Screening with LDCT placed in Epic. Yes (CT Chest Lung Cancer Screening Low Dose W/O CM) [id number] Z12.2-Screening of respiratory organs Z87.891-Personal history of nicotine dependence  I have spent 25 minutes of face to face/ virtual visit   time with  Robert Yates discussing the risks and benefits of lung cancer screening. We viewed / discussed a power point together that explained in detail the above noted topics. We paused at intervals to allow for questions to be asked and answered to ensure understanding.We discussed that the single most powerful action that he can take to decrease his risk of developing lung cancer is to quit smoking. We discussed whether or not he is ready to commit to setting a quit date. We discussed options for tools to aid in quitting smoking including nicotine replacement therapy, non-nicotine medications, support groups, Quit Smart classes, and behavior modification. We discussed that often times setting smaller, more achievable goals, such as eliminating 1 cigarette a day for a week and then 2 cigarettes a day for a week can be helpful in slowly decreasing the number of cigarettes smoked. This allows for a sense of accomplishment as well as providing a clinical benefit. I provided  him  with smoking cessation  information  with contact information for community resources, classes,  free nicotine replacement  therapy, and access to mobile apps, text messaging, and on-line smoking cessation help. I have also provided  him  the office contact information in the event he needs to contact me, or the screening staff. We discussed the time and location of the scan, and that either Doroteo Glassman RN, Joella Prince, RN  or I will call / send a letter with the results within 24-72 hours of receiving them. The patient verbalized understanding of all of  the above and had no further questions upon leaving the office. They have my contact information in the event they have any further questions.  I spent 3 minutes counseling on smoking cessation and the health risks of continued tobacco abuse.  I explained to the patient that there has been a high incidence of coronary artery disease noted on these exams. I explained that this is a non-gated exam therefore degree or severity cannot be determined. This patient is on statin therapy. I have asked the patient to follow-up with their PCP regarding any incidental finding of coronary artery disease and management with diet or medication as their PCP  feels is clinically indicated. The patient verbalized understanding of the above and had no further questions upon completion of the visit.      Magdalen Spatz, NP 09/28/2022

## 2022-09-28 NOTE — Patient Instructions (Signed)
Thank you for participating in the [facility]. It was our pleasure to meet you today. We will call you with the results of your scan within the next few days. Your scan will be assigned a Lung RADS category score by the physicians reading the scans.  This Lung RADS score determines follow up scanning.  See below for description of categories, and follow up screening recommendations. We will be in touch to schedule your follow up screening annually or based on recommendations of our providers. We will fax a copy of your scan results to your Primary Care Physician, or the physician who referred you to the program, to ensure they have the results. Please call the office if you have any questions or concerns regarding your scanning experience or results.  Our office number is [phone]. Please speak with Denise Phelps, RN. , or  Denise Buckner RN, They are  our Lung Cancer Screening RN.'s If They are unavailable when you call, Please leave a message on the voice mail. We will return your call at our earliest convenience.This voice mail is monitored several times a day.  Remember, if your scan is normal, we will scan you annually as long as you continue to meet the criteria for the program. (Age 55-77, Current smoker or smoker who has quit within the last 15 years). If you are a smoker, remember, quitting is the single most powerful action that you can take to decrease your risk of lung cancer and other pulmonary, breathing related problems. We know quitting is hard, and we are here to help.  Please let us know if there is anything we can do to help you meet your goal of quitting. If you are a former smoker, congratulations. We are proud of you! Remain smoke free! Remember you can refer friends or family members through the number above.  We will screen them to make sure they meet criteria for the program. Thank you for helping us take better care of you by  participating in Lung Screening.  You can receive free nicotine replacement therapy ( patches, gum or mints) by calling 1-800-QUIT NOW. Please call so we can get you on the path to becoming  a non-smoker. I know it is hard, but you can do this!  Lung RADS Categories:  Lung RADS 1: no nodules or definitely non-concerning nodules.  Recommendation is for a repeat annual scan in 12 months.  Lung RADS 2:  nodules that are non-concerning in appearance and behavior with a very low likelihood of becoming an active cancer. Recommendation is for a repeat annual scan in 12 months.  Lung RADS 3: nodules that are probably non-concerning , includes nodules with a low likelihood of becoming an active cancer.  Recommendation is for a 6-month repeat screening scan. Often noted after an upper respiratory illness. We will be in touch to make sure you have no questions, and to schedule your 6-month scan.  Lung RADS 4 A: nodules with concerning findings, recommendation is most often for a follow up scan in 3 months or additional testing based on our provider's assessment of the scan. We will be in touch to make sure you have no questions and to schedule the recommended 3 month follow up scan.  Lung RADS 4 B:  indicates findings that are concerning. We will be in touch with you to schedule additional diagnostic testing based on our provider's  assessment of the scan.  Other options for assistance in smoking cessation (   As covered by your insurance benefits)  Hypnosis for smoking cessation  [employer] [phone]  Acupuncture for smoking cessation  [facility] [phone]   

## 2022-09-29 ENCOUNTER — Ambulatory Visit
Admission: RE | Admit: 2022-09-29 | Discharge: 2022-09-29 | Disposition: A | Discharge status: Home / Self Care | Payer: 59 | Source: Ambulatory Visit | Attending: Acute Care | Admitting: Acute Care

## 2022-09-29 DIAGNOSIS — Z122 Encounter for screening for malignant neoplasm of respiratory organs: Secondary | ICD-10-CM

## 2022-09-29 DIAGNOSIS — Z87891 Personal history of nicotine dependence: Secondary | ICD-10-CM

## 2022-09-29 DIAGNOSIS — F17210 Nicotine dependence, cigarettes, uncomplicated: Secondary | ICD-10-CM

## 2022-09-29 DIAGNOSIS — R69 Illness, unspecified: Secondary | ICD-10-CM | POA: Diagnosis not present

## 2022-10-05 ENCOUNTER — Telehealth: Payer: Self-pay | Admitting: Acute Care

## 2022-10-05 NOTE — Telephone Encounter (Signed)
Attempt to call patient to review results of LDCT.  No answer. Left VM and call back number

## 2022-10-06 ENCOUNTER — Telehealth: Payer: Self-pay | Admitting: *Deleted

## 2022-10-06 DIAGNOSIS — Z87891 Personal history of nicotine dependence: Secondary | ICD-10-CM

## 2022-10-06 DIAGNOSIS — R911 Solitary pulmonary nodule: Secondary | ICD-10-CM

## 2022-10-06 NOTE — Telephone Encounter (Signed)
Spoke with pt and advised of Lung screening CT results. PT advised that there is a small nodule seen that measures 51m that we would like to look at again with a 6 month follow up CT.  Also noted was pulmonary hypertension. I advised pt we will send the CT report to his PCP to decide if he needs to f/u with cardiology. PT verbalized understanding and is aware we will call him closer to 6 mth to schedule his next scan. CT results faxed to PCP. Order placed for 6 mth nodule f/u [facility] CT.

## 2022-10-20 ENCOUNTER — Encounter: Payer: Self-pay | Admitting: Internal Medicine

## 2022-10-20 DIAGNOSIS — R911 Solitary pulmonary nodule: Secondary | ICD-10-CM | POA: Insufficient documentation

## 2022-10-20 DIAGNOSIS — I272 Pulmonary hypertension, unspecified: Secondary | ICD-10-CM | POA: Insufficient documentation

## 2022-10-20 DIAGNOSIS — I700 Atherosclerosis of aorta: Secondary | ICD-10-CM

## 2022-10-20 DIAGNOSIS — I2510 Atherosclerotic heart disease of native coronary artery without angina pectoris: Secondary | ICD-10-CM | POA: Insufficient documentation

## 2022-10-20 HISTORY — DX: Pulmonary hypertension, unspecified: I27.20

## 2022-10-20 HISTORY — DX: Atherosclerosis of aorta: I70.0

## 2022-10-26 NOTE — Telephone Encounter (Signed)
Left vm to call the office back

## 2022-10-31 NOTE — Telephone Encounter (Signed)
Left vm for patient to call the office back concerning this.

## 2022-11-01 ENCOUNTER — Ambulatory Visit (INDEPENDENT_AMBULATORY_CARE_PROVIDER_SITE_OTHER): Payer: Commercial Managed Care - HMO | Admitting: Internal Medicine

## 2022-11-01 ENCOUNTER — Encounter: Payer: Self-pay | Admitting: Internal Medicine

## 2022-11-01 VITALS — BP 95/60 | HR 81 | Temp 97.5°F | Ht 72.0 in | Wt 179.6 lb

## 2022-11-01 DIAGNOSIS — I2510 Atherosclerotic heart disease of native coronary artery without angina pectoris: Secondary | ICD-10-CM

## 2022-11-01 DIAGNOSIS — K635 Polyp of colon: Secondary | ICD-10-CM

## 2022-11-01 DIAGNOSIS — F17200 Nicotine dependence, unspecified, uncomplicated: Secondary | ICD-10-CM

## 2022-11-01 DIAGNOSIS — M79601 Pain in right arm: Secondary | ICD-10-CM

## 2022-11-01 DIAGNOSIS — F17210 Nicotine dependence, cigarettes, uncomplicated: Secondary | ICD-10-CM

## 2022-11-01 DIAGNOSIS — J439 Emphysema, unspecified: Secondary | ICD-10-CM

## 2022-11-01 DIAGNOSIS — I700 Atherosclerosis of aorta: Secondary | ICD-10-CM | POA: Diagnosis not present

## 2022-11-01 DIAGNOSIS — I2584 Coronary atherosclerosis due to calcified coronary lesion: Secondary | ICD-10-CM

## 2022-11-01 HISTORY — DX: Pain in right arm: M79.601

## 2022-11-01 MED ORDER — ROSUVASTATIN CALCIUM 40 MG PO TABS: 40.0000 mg | ORAL_TABLET | Freq: Every day | ORAL | 3 refills | Status: DC

## 2022-11-01 MED ORDER — ASPIRIN 81 MG PO TBEC: 81.0000 mg | DELAYED_RELEASE_TABLET | Freq: Every day | ORAL | 12 refills | Status: AC

## 2022-11-01 NOTE — Assessment & Plan Note (Signed)
Encouraged cessation, but he is precontemplative, gave handouts

## 2022-11-01 NOTE — Assessment & Plan Note (Addendum)
Showed CT images and reinterpreted- extensive calcifications of aortic wall some thick some thin Agreed to check for aaa based on this and smoking

## 2022-11-01 NOTE — Assessment & Plan Note (Addendum)
Encouraged continuing with metoprolol Start aspirin 81 Increase rosuvastatin 20->40 Showed CT images and reinterpreted for the patient - the calcifications look extensive throughout proximal coronary circulation, discussed coronary artery disease and smoking and diet- see AVS He will hold off on Ozempic, gave info Referred back to Dr. Percival Spanish cardiology for additional evaluate.

## 2022-11-01 NOTE — Patient Instructions (Signed)
It was a pleasure seeing you today!  Your health and satisfaction are my top priorities. If you believe your experience today was worthy of a 5-star rating, I'd be grateful for your feedback! [name], MD   CHECKOUT CHECKLIST  '[]'$    Schedule next appointment(s):    No follow-ups on file.  Keep march appointment or confirm it. Any requested lab visits should be scheduled as appointments too  If you are not doing well:  Return to the office sooner Please bring all your medicine bottles to each appointment If your condition begins to worsen or become severe:  go to the emergency room or even call 911  '[]'$    X-rays can be obtained at the  [facility] office. You can walk in M-F between 8:30am- noon or 1pm - 5pm. Tell them you are there for xrays ordered by me. They will send me the results, then I will let you know the results with instructions. Address: [address].  The Xray department is located in the basement.     '[]'$   (Optional):  Review your clinical notes on MyChart after they are completed.    Today's draft of the physician documented plan for today's visit: (final revisions will be visible on MyChart chart later) Arm pain, lateral, right -     DG Humerus Right; Future -     DG Shoulder Right; Future -     DG Cervical Spine 2 or 3 views; Future -     Ambulatory referral to Sports Medicine  Aortic atherosclerosis [facility]) Assessment & Plan: Showed CT images and reinterpreted Agreed to check for aaa based on this and smoking  Orders: -     VAS [place] AAA DUPLEX; Future  Coronary artery disease due to calcified coronary lesion Assessment & Plan: Encouraged continuing with metoprolol Start aspirin 81 Increase rosuvastatin 20->40 Showed CT images and reinterpreted, discussed coronary artery disease and smoking and diet- see AVS He will hold off on Ozempic, gave info Referred back to Dr. Percival Spanish cardiology for additional evaluate.  Orders: -     Ambulatory  referral to Cardiology -     Aspirin; Take 1 tablet (81 mg total) by mouth daily. Swallow whole.  Dispense: 30 tablet; Refill: 12 -     Rosuvastatin Calcium; Take 1 tablet (40 mg total) by mouth daily. Replaces 20 mg dose, for coronary artery disease  Dispense: 90 tablet; Refill: 3  Smoker -     Ambulatory referral to Pulmonology  Cigarette smoker Assessment & Plan: Encouraged cessation, but he is precontemplative, gave handouts  Orders: -     Ambulatory referral to Pulmonology  Pulmonary emphysema, unspecified emphysema type ([place]) -     Ambulatory referral to Pulmonology  Polyp of colon, unspecified part of colon, unspecified type -     Ambulatory referral to Gastroenterology      QUESTIONS & CONCERNS: CLINICAL: please contact us via phone [phone] OR MyChart messaging  LAB & IMAGING:   We will call you if the results are significantly abnormal or you don't use MyChart.  Most normal results will be posted to MyChart immediately and have a clinical review message by Dr. Randol Kern posted within 2-3 business days.   If you have not heard from [place] regarding the results in 2 weeks OR if you need priority reporting, please contact this office. MYCHART:  The fastest way to get your results and easiest way to stay in touch with [place] is by  activating your My Chart account. Instructions are located on the last page of this paperwork.  BILLING: xray and lab orders are billed from separate companies and questions./concerns should be directed to the [facility].  For visit charges please discuss with our administrative services COMPLAINTS:  please let Dr. Randol Kern know or see the [place], by asking at the front desk: we want you to be satisfied with every experience and we would be grateful for the opportunity to address any problems

## 2022-11-01 NOTE — Progress Notes (Signed)
[facility]: [phone]   Routine Medical Office Visit  Patient:  Robert Yates      Age: 62 y.o.       Sex:  male  Date:   11/01/2022  PCP:    [name], [place] Provider: [name], MD   Problem Focused Charting:   Medical Decision Making per Assessment/Plan   Robert Yates was seen today for discuss ct scan results and upper right arm pain.  Arm pain, lateral, right Overview: I think this is musculoskeletal in the right upper humerus but will get XR and refer to sports medicine.  Orders: -     DG Humerus Right; Future -     DG Shoulder Right; Future -     DG Cervical Spine 2 or 3 views; Future -     Ambulatory referral to Sports Medicine  Aortic atherosclerosis [facility]) Overview: Noted on 10/2022 LDCT   Assessment & Plan: Showed CT images and reinterpreted- extensive calcifications of aortic wall some thick some thin Agreed to check for aaa based on this and smoking  Orders: -     VAS [place] AAA DUPLEX; Future  Coronary artery disease due to calcified coronary lesion Overview: On LDCT 10/2022 left anterior descending coronary artery disease.   Assessment & Plan: Encouraged continuing with metoprolol Start aspirin 81 Increase rosuvastatin 20->40 Showed CT images and reinterpreted for the patient - the calcifications look extensive throughout proximal coronary circulation, discussed coronary artery disease and smoking and diet- see AVS He will hold off on Ozempic, gave info Referred back to Dr. Percival Spanish cardiology for additional evaluate.  Orders: -     Ambulatory referral to Cardiology -     Aspirin; Take 1 tablet (81 mg total) by mouth daily. Swallow whole.  Dispense: 30 tablet; Refill: 12 -     Rosuvastatin Calcium; Take 1 tablet (40 mg total) by mouth daily. Replaces 20 mg dose, for coronary artery disease  Dispense: 90 tablet; Refill: 3  Smoker -     Ambulatory referral to Pulmonology  Cigarette  smoker Overview: He has been cutting back he is down to 1 pack a day but has smoked as much is 2-1/2 packs in a day in the past on a stressful day but probably never more than 2 a day on average.  Started smoking at age 20.  Reports he is out of date on yearly CAT scan screening  Assessment & Plan: Encouraged cessation, but he is precontemplative, gave handouts  Orders: -     Ambulatory referral to Pulmonology  Pulmonary emphysema, unspecified emphysema type ([place]) -     Ambulatory referral to Pulmonology  Polyp of colon, unspecified part of colon, unspecified type -     Ambulatory referral to Gastroenterology    Medical Decision Making: 2 or more stable chronic illnesses Independent interpretation of a test performed by another physician; imaging such as XR or EKG reinterpretations   Ordering more than 3 unique tests    Subjective - Clinical Presentation:   Robert Yates is a 62 y.o. male  Patient Active Problem List   Diagnosis Date Noted   Arm pain, lateral, right 11/01/2022   Polyp of colon 11/01/2022   Lung nodule seen on imaging study 10/20/2022   Pulmonary hypertension ([place]) 10/20/2022   CAD (coronary artery disease) 10/20/2022   Aortic atherosclerosis ([place]) 10/20/2022   Back pain 07/28/2022   Insomnia 07/28/2022   Multiple adenomatous polyps 07/28/2022   Hyperlipidemia  07/28/2022   Prediabetes 07/28/2022   Ruptured lumbar disc 07/28/2022   Murmur 07/28/2022   Skin lesion of face 01/03/2022   Tremor 01/03/2022   History of colon polyps 11/10/2021   Alternating constipation and diarrhea 11/10/2021   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   Smoker    Elevated LFTs    Cigarette smoker 06/10/2021   Palpitations 06/04/2020   COPD  GOLD at least 2  / active smoking  12/16/2019   Past Medical History:  Diagnosis Date   Allergy 1970   Anxiety 2021   Aortic atherosclerosis ([place]) 10/20/2022   Noted on 10/2022 LDCT    Asthma 2020   Cataract 2000   COPD  GOLD at least 2  /  active smoking  12/16/2019   COPD (chronic obstructive pulmonary disease) (HCC)    GERD (gastroesophageal reflux disease) 2020   Heart murmur 1970   Hyperlipidemia 07/28/2022   The 10-year ASCVD risk score (Arnett DK, et al., 2019) is: 14.9%   Values used to calculate the score:     Age: 51 years     Sex: Male     Is Non-Hispanic African American: No     Diabetic: No     Tobacco smoker: Yes     Systolic Blood Pressure: 196 mmHg     Is BP treated: No     HDL Cholesterol: 36.5 mg/dL     Total Cholesterol: 214 mg/dL   Started atorvastatin 07/2022   Insomnia 07/28/2022   - he doing much better with seroquel 50-100 nightly, failed many other meds before "only thing that ever worked"   Palpitations    Prediabetes 07/28/2022   Lab Results Component Value Date  HGBA1C 6.1 07/07/2022  HGBA1C 6.0 01/03/2022  HGBA1C 5.7 (H) 12/16/2019     Pulmonary hypertension ([place]) 10/20/2022   On CT lung cancer screening 4. Dilatation of the pulmonic trunk and left main pulmonary artery, which may suggest pulmonary arterial hypertension. Further clinical evaluation is recommended.    Outpatient Medications Prior to Visit  Medication Sig   acetaminophen (TYLENOL) 500 MG tablet Take 1,000 mg by mouth every 6 (six) hours as needed (pain/headaches).   amoxicillin (AMOXIL) 875 MG tablet SMARTSIG:1 Tablet(s) By Mouth Every 12 Hours   fluticasone furoate-vilanterol (BREO ELLIPTA) 100-25 MCG/ACT AEPB Inhale 1 puff into the lungs daily.   Ibuprofen-diphenhydrAMINE HCl (IBUPROFEN PM) 200-25 MG CAPS Take 2 tablets by mouth at bedtime.   metoprolol tartrate (LOPRESSOR) 25 MG tablet Take 0.5 tablets (12.5 mg total) by mouth 2 (two) times daily.   QUEtiapine (SEROQUEL) 100 MG tablet TAKE 1/2 TO 1 TABLET(50 TO 100 MG) BY MOUTH AT BEDTIME   [DISCONTINUED] rosuvastatin (CRESTOR) 20 MG tablet Take 1 tablet (20 mg total) by mouth daily.   No facility-administered medications prior to visit.    Chief Complaint  Patient presents  with   Discuss CT scan results   Upper right arm pain    Since October, consistent when moving it and with certain positions, radiates down to right hand.    HPI           Objective:  Physical Exam  BP 95/60 (BP Location: Left Arm, Patient Position: Sitting)   Pulse 81   Temp (!) 97.5 F (36.4 C) (Temporal)   Ht 6' (1.829 m)   Wt 179 lb 9.6 oz (81.5 kg)   SpO2 97%   BMI 24.36 kg/m  Well developed, well nourished  by BMI criteria but truncal adiposity (waist circumference or caliper)  should be used instead. Wt Readings from Last 10 Encounters:  11/01/22 179 lb 9.6 oz (81.5 kg)  07/28/22 173 lb 3.2 oz (78.6 kg)  07/07/22 172 lb 9.6 oz (78.3 kg)  02/21/22 174 lb (78.9 kg)  01/03/22 174 lb (78.9 kg)  12/09/21 173 lb (78.5 kg)  11/10/21 174 lb 3.2 oz (79 kg)  07/14/21 169 lb (76.7 kg)  06/23/21 174 lb (78.9 kg)  06/10/21 174 lb (78.9 kg)   Vital signs reviewed.  Nursing notes reviewed. Weight trend reviewed. General Appearance:  Well developed, well nourished male in no acute distress.   Normal work of breathing at rest Musculoskeletal: All extremities are intact.  Neurological:  Awake, alert,  No obvious focal neurological deficits or cognitive impairments Psychiatric:  Appropriate mood, pleasant demeanor Problem-specific findings:  none   Results Reviewed: No results found for any visits on 11/01/22.  No results found for this or any previous visit (from the past 2160 hour(s)).        Signed: [name], MD 11/01/2022 2:20 PM

## 2022-11-01 NOTE — Telephone Encounter (Signed)
Patient was seen for OV today with Dr.Morrison to discuss these CT scan results.

## 2022-11-02 ENCOUNTER — Ambulatory Visit (INDEPENDENT_AMBULATORY_CARE_PROVIDER_SITE_OTHER)
Admission: RE | Admit: 2022-11-02 | Discharge: 2022-11-02 | Disposition: A | Discharge status: Home / Self Care | Payer: Commercial Managed Care - HMO | Source: Ambulatory Visit | Attending: Internal Medicine | Admitting: Internal Medicine

## 2022-11-02 DIAGNOSIS — M79601 Pain in right arm: Secondary | ICD-10-CM

## 2022-11-05 ENCOUNTER — Encounter: Payer: Self-pay | Admitting: Internal Medicine

## 2022-11-05 DIAGNOSIS — M47812 Spondylosis without myelopathy or radiculopathy, cervical region: Secondary | ICD-10-CM | POA: Insufficient documentation

## 2022-11-05 NOTE — Progress Notes (Signed)
Call and see if he wants physical therapy for out of alignment neck

## 2022-11-07 NOTE — Progress Notes (Unsigned)
   I, Peterson Lombard, LAT, ATC acting as a scribe for [name], MD.  Subjective:    CC: Right arm pain  HPI: Is a 62 year old male presenting with right arm pain?  Patient locates pain to?  Neck pain: Aggravates: Treatments tried:  Dx imaging: 11/02/2022 C-spine, R humerus, & R shoulder x-ray  Pertinent review of Systems: ***  Relevant historical information: ***   Objective:   There were no vitals filed for this visit. General: Well Developed, well nourished, and in no acute distress.   MSK: ***  Lab and Radiology Results No results found for this or any previous visit (from the past 72 hour(s)). No results found.    Impression and Recommendations:    Assessment and Plan: 62 y.o. male with ***.  PDMP not reviewed this encounter. No orders of the defined types were placed in this encounter.  No orders of the defined types were placed in this encounter.   Discussed warning signs or symptoms. Please see discharge instructions. Patient expresses understanding.   ***

## 2022-11-08 ENCOUNTER — Ambulatory Visit: Payer: Commercial Managed Care - HMO | Admitting: Family Medicine

## 2022-11-08 ENCOUNTER — Ambulatory Visit: Payer: Self-pay

## 2022-11-08 VITALS — BP 118/76 | HR 83 | Ht 72.0 in | Wt 181.0 lb

## 2022-11-08 DIAGNOSIS — M79601 Pain in right arm: Secondary | ICD-10-CM | POA: Diagnosis not present

## 2022-11-08 NOTE — Patient Instructions (Signed)
Thank you for coming in today.   I've referred you to Physical Therapy.  Let us know if you don't hear from them in one week.   Recheck with me in about 6 weeks.   Let me know if this is not working or you have a problem.

## 2022-11-15 ENCOUNTER — Ambulatory Visit: Payer: Commercial Managed Care - HMO | Attending: Cardiology | Admitting: Cardiology

## 2022-11-15 ENCOUNTER — Encounter: Payer: Self-pay | Admitting: Cardiology

## 2022-11-15 VITALS — BP 132/78 | HR 78 | Ht 74.0 in | Wt 179.4 lb

## 2022-11-15 DIAGNOSIS — I2510 Atherosclerotic heart disease of native coronary artery without angina pectoris: Secondary | ICD-10-CM

## 2022-11-15 DIAGNOSIS — R072 Precordial pain: Secondary | ICD-10-CM

## 2022-11-15 NOTE — Patient Instructions (Signed)
  Testing/Procedures:  Your physician has requested that you have an exercise tolerance test. For further information please visit [email or web address]. Please also follow instruction sheet, as given. [place], you and your health needs are our priority.  As part of our continuing mission to provide you with exceptional heart care, we have created designated Provider Care Teams.  These Care Teams include your primary Cardiologist (physician) and Advanced Practice Providers (APPs -  Physician Assistants and Nurse Practitioners) who all work together to provide you with the care you need, when you need it.  We recommend signing up for the patient portal called "MyChart".  Sign up information is provided on this After Visit Summary.  MyChart is used to connect with patients for Virtual Visits (Telemedicine).  Patients are able to view lab/test results, encounter notes, upcoming appointments, etc.  Non-urgent messages can be sent to your provider as well.   To learn more about what you can do with MyChart, go to [email or web address].    Your next appointment:    AS NEEDED

## 2022-11-15 NOTE — Progress Notes (Signed)
Cardiology Office Note   Date:  11/15/2022   ID:  CAYDENCE ENCK, DOB [birth year], MRN [id number]  PCP:  [name], MD  Cardiologist:   Minus Breeding, MD Referring:  [name], MD  Chief Complaint  Patient presents with   Elevated coronary calcium      History of Present Illness: Robert Yates is a 62 y.o. male who was referred by [name], MD for evaluation of palpitations.  He was referred by [name], MD .  I saw him last in 2021.  He has done okay.  However, recently on CT for lung cancer screening he was noted to have aortic atherosclerosis and there is a mention of coronary artery calcium. The patient denies any new symptoms such as neck or arm discomfort.  There might be occasional chest discomfort with activity.  This is mild and infrequent.  However, he has not been as active since he has been retired. There has been no new shortness of breath, PND or orthopnea. There have been no reported palpitations, presyncope or syncope.   He was started on a statin.  Has been started on beta-blockers.   Past Medical History:  Diagnosis Date   Allergy 1970   Anxiety 2021   Aortic atherosclerosis ([place]) 10/20/2022   Noted on 10/2022 LDCT    Asthma 2020   Cataract 2000   COPD  GOLD at least 2  / active smoking  12/16/2019   COPD (chronic obstructive pulmonary disease) (HCC)    GERD (gastroesophageal reflux disease) 2020   Heart murmur 1970   Hyperlipidemia 07/28/2022   The 10-year ASCVD risk score (Arnett DK, et al., 2019) is: 14.9%   Values used to calculate the score:     Age: 57 years     Sex: Male     Is Non-Hispanic African American: No     Diabetic: No     Tobacco smoker: Yes     Systolic Blood Pressure: 979 mmHg     Is BP treated: No     HDL Cholesterol: 36.5 mg/dL     Total Cholesterol: 214 mg/dL   Started atorvastatin 07/2022   Insomnia 07/28/2022   - he doing much better with seroquel 50-100 nightly, failed many other meds before "only thing  that ever worked"   Palpitations    Prediabetes 07/28/2022   Lab Results Component Value Date  HGBA1C 6.1 07/07/2022  HGBA1C 6.0 01/03/2022  HGBA1C 5.7 (H) 12/16/2019     Pulmonary hypertension ([place]) 10/20/2022   On CT lung cancer screening 4. Dilatation of the pulmonic trunk and left main pulmonary artery, which may suggest pulmonary arterial hypertension. Further clinical evaluation is recommended.    Past Surgical History:  Procedure Laterality Date   CHOLECYSTECTOMY N/A 07/14/2021   Procedure: LAPAROSCOPIC CHOLECYSTECTOMY;  Surgeon: Dwan Bolt, MD;  Location: [facility];  Service: General;  Laterality: N/A;   COLONOSCOPY  1994   Dr. Fuller Plan; hyperplastic polyps   COLONOSCOPY WITH PROPOFOL N/A 12/13/2021   Procedure: COLONOSCOPY WITH PROPOFOL;  Surgeon: Eloise Harman, DO;  [facility];  Service: Endoscopy;  Laterality: N/A;  11:00am   POLYPECTOMY  12/13/2021   Procedure: POLYPECTOMY;  Surgeon: Eloise Harman, DO;  [facility];  Service: Endoscopy;;   skin grafts     TONSILLECTOMY       Current Outpatient Medications  Medication Sig Dispense Refill   acetaminophen (TYLENOL) 500 MG tablet Take  1,000 mg by mouth every 6 (six) hours as needed (pain/headaches).     aspirin EC 81 MG tablet Take 1 tablet (81 mg total) by mouth daily. Swallow whole. 30 tablet 12   fluticasone furoate-vilanterol (BREO ELLIPTA) 100-25 MCG/ACT AEPB Inhale 1 puff into the lungs daily. 60 each 11   Ibuprofen-diphenhydrAMINE HCl (IBUPROFEN PM) 200-25 MG CAPS Take 2 tablets by mouth at bedtime.     metoprolol tartrate (LOPRESSOR) 25 MG tablet Take 0.5 tablets (12.5 mg total) by mouth 2 (two) times daily. 180 tablet 0   QUEtiapine (SEROQUEL) 100 MG tablet TAKE 1/2 TO 1 TABLET(50 TO 100 MG) BY MOUTH AT BEDTIME 30 tablet 2   rosuvastatin (CRESTOR) 40 MG tablet Take 1 tablet (40 mg total) by mouth daily. Replaces 20 mg dose, for coronary artery disease 90 tablet 3   No current  facility-administered medications for this visit.    Allergies:   Lipitor [atorvastatin] and Sulfa antibiotics   Social History   Socioeconomic History   Marital status: Divorced    Spouse name: has a girlfriend   Number of children: [name]   Years of education: [name]   Highest education level: [name]  Occupational History   Occupation: farmer  Tobacco Use   Smoking status: Every Day    Packs/day: 1.50    Years: 47.00    Total pack years: 70.50    Types: Cigarettes    Start date: 11/07/1974   Smokeless tobacco: Never   Tobacco comments:    have been smoking for several yrs nothing seems to help me stop  Vaping Use   Vaping Use: Never used  Substance and Sexual Activity   Alcohol use: Yes    Comment: very seldom use of alcohol   Drug use: Never    Comment: Nothing in 30 years   Sexual activity: Yes  Other Topics Concern   [name]  Social History Narrative   Divorced for 20 years,married for 13 years previously.Lives with girlfriend of 2 years.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      Retired 2022   Right handed   Caffeine: 1 cup/day of decaf    Social Determinants of Radio broadcast assistant Strain: [name]  Food Insecurity: [name]  Transportation Needs: [name]  Physical Activity: [name]  Stress: [name]  Social Connections: [name]  Intimate Partner Violence: [name]   Family History  Adopted: Yes  Problem Relation Age of Onset   Colon cancer Brother        diagnosed in early 34s  He does not know his family history.     ROS:  Please see the history of present illness.   Otherwise, review of systems are positive for none.   All other systems are reviewed and negative.    PHYSICAL EXAM: VS:  BP 132/78   Pulse 78   Ht '6\' 2"'$  (1.88 m)   Wt 179 lb 6.4 oz (81.4 kg)   SpO2 98%   BMI 23.03 kg/m  , BMI Body mass index is 23.03 kg/m. GENERAL:  Well appearing HEENT:  Pupils equal round and reactive,  fundi not visualized, oral mucosa unremarkable NECK:  No jugular venous distention, waveform within normal limits, carotid upstroke brisk and symmetric, no bruits, no thyromegaly LYMPHATICS:  No cervical, inguinal adenopathy LUNGS:  Clear to auscultation bilaterally BACK:  No CVA tenderness CHEST:  Unremarkable HEART:  PMI  not displaced or sustained,S1 and S2 within normal limits, no S3, no S4, no clicks, no rubs, no murmurs ABD:  Flat, positive bowel sounds normal in frequency in pitch, no bruits, no rebound, no guarding, no midline pulsatile mass, no hepatomegaly, no splenomegaly EXT:  2 plus pulses upper and decreased DP/PT, no edema, no cyanosis no clubbing SKIN:  No rashes no nodules NEURO:  Cranial nerves II through XII grossly intact, motor grossly intact throughout PSYCH:  Cognitively intact, oriented to person place and time   EKG:  EKG is  ordered today. The ekg ordered today demonstrates sinus rhythm, rate 78, axis within normal limits, intervals within normal limits, premature ventricular contractions.   Recent Labs: 01/03/2022: Hemoglobin 17.6; Platelets 280.0; TSH 1.82 07/07/2022: ALT 27; BUN 10; Creatinine, Ser 0.90; Potassium 4.3; Sodium 138    Lipid Panel    Component Value Date/Time   CHOL 214 (H) 07/07/2022 1446   TRIG 340.0 (H) 07/07/2022 1446   HDL 36.50 (L) 07/07/2022 1446   CHOLHDL 6 07/07/2022 1446   VLDL 68.0 (H) 07/07/2022 1446   LDLCALC 123 (H) 01/03/2022 1051   LDLCALC 135 (H) 12/16/2019 0919   LDLDIRECT 148.0 07/07/2022 1446      Wt Readings from Last 3 Encounters:  11/15/22 179 lb 6.4 oz (81.4 kg)  11/08/22 181 lb (82.1 kg)  11/01/22 179 lb 9.6 oz (81.5 kg)      Other studies Reviewed: Additional studies/ records that were reviewed today include: Labs. Review of the above records demonstrates:     ASSESSMENT AND PLAN:  ELEVATED CORONARY CALCIUM:  I will bring the patient back for a POET (Plain Old Exercise Test). This will allow me to  screen for obstructive coronary disease, risk stratify and very importantly provide a prescription for exercise.   (Of note he was instructed not to take his beta blocker prior to the test.)    PALPITATIONS: He is not really noticing these like he used to.  No change in therapy.   CONGENITAL HEART DISEASE:  Echo was unremarkable.  No further testing.   TOBACCO: He has tried to quit smoking and he has had a conversation with his primary about possibly using Chantix.  DECREASED PULSES: He does not have any symptoms.  He is participating with risk reduction.   Current medicines are reviewed at length with the patient today.  The patient does not have concerns regarding medicines.   The following changes have been made:  None  Labs/ tests ordered today include:    Orders Placed This Encounter  Procedures   Cardiac Stress Test: Informed Consent Details: Physician/Practitioner Attestation; Transcribe to consent form and obtain patient signature   Exercise Tolerance Test   EKG 12-Lead     Disposition:   FU with me based on the results of the above.    Signed, Minus Breeding, MD  11/15/2022 5:00 PM    [place]

## 2022-11-17 ENCOUNTER — Encounter: Payer: Self-pay | Admitting: Physical Therapy

## 2022-11-17 ENCOUNTER — Ambulatory Visit (INDEPENDENT_AMBULATORY_CARE_PROVIDER_SITE_OTHER): Payer: Commercial Managed Care - HMO | Admitting: Physical Therapy

## 2022-11-17 DIAGNOSIS — M25611 Stiffness of right shoulder, not elsewhere classified: Secondary | ICD-10-CM

## 2022-11-17 DIAGNOSIS — M25511 Pain in right shoulder: Secondary | ICD-10-CM | POA: Diagnosis not present

## 2022-11-17 NOTE — Therapy (Signed)
OUTPATIENT PHYSICAL THERAPY UPPER EXTREMITY EVALUATION   Patient Name: Robert Yates MRN: [id number] DOB:1961-09-10, 62 y.o., male Today's Date: 11/17/2022  END OF SESSION:  PT End of Session - 11/17/22 1157     Visit Number 1    Number of Visits 16    Date for PT Re-Evaluation 01/12/23    Authorization Type [employer]    PT Start Time 1106    PT Stop Time 1145    PT Time Calculation (min) 39 min    Activity Tolerance Patient tolerated treatment well    Behavior During Therapy [facility] for tasks assessed/performed             Past Medical History:  Diagnosis Date   Allergy 1970   Anxiety 2021   Aortic atherosclerosis ([place]) 10/20/2022   Noted on 10/2022 LDCT    Asthma 2020   Cataract 2000   COPD  GOLD at least 2  / active smoking  12/16/2019   COPD (chronic obstructive pulmonary disease) (HCC)    GERD (gastroesophageal reflux disease) 2020   Heart murmur 1970   Hyperlipidemia 07/28/2022   The 10-year ASCVD risk score (Arnett DK, et al., 2019) is: 14.9%   Values used to calculate the score:     Age: 18 years     Sex: Male     Is Non-Hispanic African American: No     Diabetic: No     Tobacco smoker: Yes     Systolic Blood Pressure: 315 mmHg     Is BP treated: No     HDL Cholesterol: 36.5 mg/dL     Total Cholesterol: 214 mg/dL   Started atorvastatin 07/2022   Insomnia 07/28/2022   - he doing much better with seroquel 50-100 nightly, failed many other meds before "only thing that ever worked"   Palpitations    Prediabetes 07/28/2022   Lab Results Component Value Date  HGBA1C 6.1 07/07/2022  HGBA1C 6.0 01/03/2022  HGBA1C 5.7 (H) 12/16/2019     Pulmonary hypertension ([place]) 10/20/2022   On CT lung cancer screening 4. Dilatation of the pulmonic trunk and left main pulmonary artery, which may suggest pulmonary arterial hypertension. Further clinical evaluation is recommended.   Past Surgical History:  Procedure Laterality Date   CHOLECYSTECTOMY N/A 07/14/2021   Procedure:  LAPAROSCOPIC CHOLECYSTECTOMY;  Surgeon: Dwan Bolt, MD;  Location: [place];  Service: General;  Laterality: N/A;   COLONOSCOPY  1994   Dr. Fuller Plan; hyperplastic polyps   COLONOSCOPY WITH PROPOFOL N/A 12/13/2021   Procedure: COLONOSCOPY WITH PROPOFOL;  Surgeon: Eloise Harman, DO;  [facility];  Service: Endoscopy;  Laterality: N/A;  11:00am   POLYPECTOMY  12/13/2021   Procedure: POLYPECTOMY;  Surgeon: Eloise Harman, DO;  [facility];  Service: Endoscopy;;   skin grafts     TONSILLECTOMY     Patient Active Problem List   Diagnosis Date Noted   Cervical spondylosis 11/05/2022   Arm pain, lateral, right 11/01/2022   Polyp of colon 11/01/2022   Lung nodule seen on imaging study 10/20/2022   Pulmonary hypertension ([place]) 10/20/2022   CAD (coronary artery disease) 10/20/2022   Aortic atherosclerosis ([place]) 10/20/2022   Back pain 07/28/2022   Insomnia 07/28/2022   Multiple adenomatous polyps 07/28/2022   Hyperlipidemia 07/28/2022   Prediabetes 07/28/2022   Ruptured lumbar disc 07/28/2022   Murmur 07/28/2022   Skin lesion of face 01/03/2022   Tremor 01/03/2022   History of colon polyps 11/10/2021   Alternating constipation  and diarrhea 11/10/2021   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   Smoker    Elevated LFTs    Cigarette smoker 06/10/2021   Palpitations 06/04/2020   COPD  GOLD at least 2  / active smoking  12/16/2019    PCP: Berniece Pap  REFERRING PROVIDER: [name]  REFERRING DIAG:   THERAPY DIAG:  Acute pain of right shoulder  Stiffness of right shoulder, not elsewhere classified  Rationale for Evaluation and Treatment: Rehabilitation    SUBJECTIVE:                                                                                                                                                                                      SUBJECTIVE STATEMENT: R upper arm pain and in shoulder. He is R handed. Started in October, no injury, but did play  golf for 1st time in 5 years.  Can now play golf without pain.  Pt is retired.   PERTINENT HISTORY: none  PAIN:  Are you having pain? Yes: NPRS scale: up to 5-10/10 Pain location: R shoulder , upper arm Pain description: Sore, intermittent with certain motions.  Aggravating factors: reaching across, reaching up, reaching out to the side, behind back.  Relieving factors: rest  PRECAUTIONS: None  WEIGHT BEARING RESTRICTIONS: No  FALLS:  Has patient fallen in last 6 months? No  PLOF: Independent  PATIENT GOALS:   NEXT MD VISIT:   OBJECTIVE:   DIAGNOSTIC FINDINGS:    PATIENT SURVEYS :  FOTO: 64.5 (forearm set up )   COGNITION: Overall cognitive status: Within functional limits for tasks assessed     SENSATION: WFL  POSTURE: rounded shoulders, forward head,     UPPER EXTREMITY ROM:   ROM Right Eval A/ PROM Left [place] A/PROM  Shoulder flexion 100/ 125 120/130  Shoulder extension    Shoulder abduction 80/ 130   Shoulder adduction    Shoulder internal rotation 40   Shoulder external rotation 60   Elbow flexion    Elbow extension    Wrist flexion    Wrist extension    Wrist ulnar deviation    Wrist radial deviation    Wrist pronation    Wrist supination    (Blank rows = not tested)  UPPER EXTREMITY MMT: Flexion: 3-/5    Abduction: 3-/5 IR: 4/5 ER: 4/5    SHOULDER SPECIAL TESTS:  JOINT MOBILITY TESTING: hypomobile GHJ, for flex, abd, IR, with pain at end ranges for all motions.   PALPATION: No pain in shoulder or deltoid to palpate. , hypomobile GHJ.    TODAY'S TREATMENT:  DATE:  11/17/21: Therapeutic Exercise: Aerobic: Supine: Shoulder flexion/cane x 10, 5 sec holds ;  ER butterfly x 10 , 5 sec holds.  Seated:  Table slides/flexion x 10;  Standing:  Stretches:    Neuromuscular Re-education: Manual Therapy:  LAD, PROM R shoulder, all motions.     PATIENT EDUCATION: Education details: PT POC, Exam findings, HEP, use of heat for tight muscles,  Person educated: Patient Education method: Explanation, Demonstration, Tactile cues, Verbal cues, and Handouts Education comprehension: verbalized understanding, returned demonstration, verbal cues required, tactile cues required, and needs further education  HOME EXERCISE PROGRAM: Access Code: [email or web address] URL: [email or web address] Date: 11/17/2022 Prepared by: Lyndee Hensen  Exercises - Supine Shoulder Flexion Extension AAROM with Dowel  - 2 x daily - 1 sets - 10 reps - Supine Chest Stretch with Elbows Bent  - 2 x daily - 1 sets - 10 reps - 5 hold - Seated Shoulder Flexion Towel Slide at Table Top  - 2 x daily - 1 sets - 10 reps  ASSESSMENT:  CLINICAL IMPRESSION: Patient presents with primary complaint of increased pain in R shoulder.He has significant joint stiffness that is effecting PROM and AROM. He also has pain at end range for all motions. He has difficulty with reaching, carrying, ADLs and IADLs. Pt with decreased ability for full functional activities due to pain, and will benefit from skilled PT to improve.    OBJECTIVE IMPAIRMENTS: decreased mobility, decreased ROM, decreased strength, increased muscle spasms, impaired flexibility, impaired UE functional use, improper body mechanics, and pain.   ACTIVITY LIMITATIONS: carrying, lifting, sitting, sleeping, dressing, reach over head, hygiene/grooming, and locomotion level  PARTICIPATION LIMITATIONS: meal prep, cleaning, laundry, driving, shopping,   PERSONAL FACTORS: none are also affecting patient's functional outcome.   REHAB POTENTIAL: Good  CLINICAL DECISION MAKING: Stable/uncomplicated  EVALUATION COMPLEXITY: Low  GOALS: Goals reviewed with patient? Yes  SHORT TERM GOALS: Target date: 12/01/2022  Pt to be independent with initial HEP  Goal status:  INITIAL  2.  Pt to demo improved PROM for shoulder flexion by at least 5 deg.   Goal status: INITIAL    LONG TERM GOALS: Target date: 01/12/2023  Pt to be independent with final HEP  Goal status: INITIAL  2.  Pt to report decreased pain in R shoulder to 0-2/10 with reaching and IADLs.   Goal status: INITIAL  3.  Pt to demo improved AROM to be WNL to improve ability for ADLs and IADLs.   Goal status: INITIAL   PLAN: PT FREQUENCY: 1-2x/week  PT DURATION: 8 weeks  PLANNED INTERVENTIONS: Therapeutic exercises, Therapeutic activity, Neuromuscular re-education, Patient/Family education, Self Care, Joint mobilization, Joint manipulation, Aquatic Therapy, Dry Needling, Electrical stimulation, Spinal manipulation, Spinal mobilization, Cryotherapy, Moist heat, Taping, Vasopneumatic device, Traction, Ultrasound, Ionotophoresis '4mg'$ /ml Dexamethasone, and Manual therapy   PLAN FOR NEXT SESSION:   Lyndee Hensen, PT, DPT 12:02 PM  11/17/22

## 2022-11-23 ENCOUNTER — Other Ambulatory Visit: Payer: Self-pay | Admitting: Internal Medicine

## 2022-11-23 ENCOUNTER — Encounter: Payer: Self-pay | Admitting: *Deleted

## 2022-11-23 DIAGNOSIS — F17200 Nicotine dependence, unspecified, uncomplicated: Secondary | ICD-10-CM

## 2022-11-24 ENCOUNTER — Ambulatory Visit: Payer: Commercial Managed Care - HMO | Attending: Cardiology

## 2022-11-24 DIAGNOSIS — R072 Precordial pain: Secondary | ICD-10-CM | POA: Diagnosis not present

## 2022-11-24 LAB — EXERCISE TOLERANCE TEST
Angina Index: 0
Base ST Depression (mm): 0 mm
Duke Treadmill Score: 3
Estimated workload: 4.6
Exercise duration (min): 3 min
Exercise duration (sec): 0 s
MPHR: 156 {beats}/min
Peak HR: 144 {beats}/min
Percent HR: 92 %
RPE: 15
Rest HR: 92 {beats}/min
ST Depression (mm): 0 mm

## 2022-11-28 ENCOUNTER — Ambulatory Visit (INDEPENDENT_AMBULATORY_CARE_PROVIDER_SITE_OTHER): Payer: Commercial Managed Care - HMO | Admitting: Physical Therapy

## 2022-11-28 ENCOUNTER — Encounter: Payer: Self-pay | Admitting: Physical Therapy

## 2022-11-28 DIAGNOSIS — M25511 Pain in right shoulder: Secondary | ICD-10-CM

## 2022-11-28 DIAGNOSIS — M25611 Stiffness of right shoulder, not elsewhere classified: Secondary | ICD-10-CM | POA: Diagnosis not present

## 2022-11-28 NOTE — Therapy (Signed)
OUTPATIENT PHYSICAL THERAPY UPPER EXTREMITY TREATMENT    Patient Name: Robert Yates MRN: [id number] DOB:[birth year], 62 y.o., male Today's Date: 11/28/2022  END OF SESSION:  PT End of Session - 11/28/22 1056     Visit Number 2    Number of Visits 16    Date for PT Re-Evaluation 01/12/23    Authorization Type [employer]    PT Start Time 1100    PT Stop Time 1140    PT Time Calculation (min) 40 min    Activity Tolerance Patient tolerated treatment well    Behavior During Therapy [facility] for tasks assessed/performed             Past Medical History:  Diagnosis Date   Allergy 1970   Anxiety 2021   Aortic atherosclerosis ([place]) 10/20/2022   Noted on 10/2022 LDCT    Asthma 2020   Cataract 2000   COPD  GOLD at least 2  / active smoking  12/16/2019   COPD (chronic obstructive pulmonary disease) (HCC)    GERD (gastroesophageal reflux disease) 2020   Heart murmur 1970   Hyperlipidemia 07/28/2022   The 10-year ASCVD risk score (Arnett DK, et al., 2019) is: 14.9%   Values used to calculate the score:     Age: 57 years     Sex: Male     Is Non-Hispanic African American: No     Diabetic: No     Tobacco smoker: Yes     Systolic Blood Pressure: A999333 mmHg     Is BP treated: No     HDL Cholesterol: 36.5 mg/dL     Total Cholesterol: 214 mg/dL   Started atorvastatin 07/2022   Insomnia 07/28/2022   - he doing much better with seroquel 50-100 nightly, failed many other meds before "only thing that ever worked"   Palpitations    Prediabetes 07/28/2022   Lab Results Component Value Date  HGBA1C 6.1 07/07/2022  HGBA1C 6.0 01/03/2022  HGBA1C 5.7 (H) 12/16/2019     Pulmonary hypertension ([place]) 10/20/2022   On CT lung cancer screening 4. Dilatation of the pulmonic trunk and left main pulmonary artery, which may suggest pulmonary arterial hypertension. Further clinical evaluation is recommended.   Past Surgical History:  Procedure Laterality Date   CHOLECYSTECTOMY N/A 07/14/2021   Procedure:  LAPAROSCOPIC CHOLECYSTECTOMY;  Surgeon: Dwan Bolt, MD;  Location: [place];  Service: General;  Laterality: N/A;   COLONOSCOPY  1994   Dr. Fuller Plan; hyperplastic polyps   COLONOSCOPY WITH PROPOFOL N/A 12/13/2021   Procedure: COLONOSCOPY WITH PROPOFOL;  Surgeon: Eloise Harman, DO;  [facility];  Service: Endoscopy;  Laterality: N/A;  11:00am   POLYPECTOMY  12/13/2021   Procedure: POLYPECTOMY;  Surgeon: Eloise Harman, DO;  [facility];  Service: Endoscopy;;   skin grafts     TONSILLECTOMY     Patient Active Problem List   Diagnosis Date Noted   Cervical spondylosis 11/05/2022   Arm pain, lateral, right 11/01/2022   Polyp of colon 11/01/2022   Lung nodule seen on imaging study 10/20/2022   Pulmonary hypertension ([place]) 10/20/2022   CAD (coronary artery disease) 10/20/2022   Aortic atherosclerosis ([place]) 10/20/2022   Back pain 07/28/2022   Insomnia 07/28/2022   Multiple adenomatous polyps 07/28/2022   Hyperlipidemia 07/28/2022   Prediabetes 07/28/2022   Ruptured lumbar disc 07/28/2022   Murmur 07/28/2022   Skin lesion of face 01/03/2022   Tremor 01/03/2022   History of colon polyps 11/10/2021   Alternating  constipation and diarrhea 11/10/2021   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   Smoker    Elevated LFTs    Cigarette smoker 06/10/2021   Palpitations 06/04/2020   COPD  GOLD at least 2  / active smoking  12/16/2019    PCP: Berniece Pap  REFERRING PROVIDER: [name]  REFERRING DIAG:   THERAPY DIAG:  Acute pain of right shoulder  Stiffness of right shoulder, not elsewhere classified  Rationale for Evaluation and Treatment: Rehabilitation    SUBJECTIVE:                                                                                                                                                                                      SUBJECTIVE STATEMENT: 11/28/2022  Pt stats arm feels like its loosening up a bit.    Eval: R upper arm pain and in  shoulder. He is R handed. Started in October, no injury, but did play golf for 1st time in 5 years.  Can now play golf without pain.  Pt is retired.   PERTINENT HISTORY: none  PAIN:  Are you having pain? Yes: NPRS scale: up to 5-10/10 Pain location: R shoulder , upper arm Pain description: Sore, intermittent with certain motions.  Aggravating factors: reaching across, reaching up, reaching out to the side, behind back.  Relieving factors: rest  PRECAUTIONS: None  WEIGHT BEARING RESTRICTIONS: No  FALLS:  Has patient fallen in last 6 months? No  PLOF: Independent  PATIENT GOALS:   NEXT MD VISIT:   OBJECTIVE:   DIAGNOSTIC FINDINGS:    PATIENT SURVEYS :  FOTO: 64.5 (forearm set up )   COGNITION: Overall cognitive status: Within functional limits for tasks assessed     SENSATION: WFL  POSTURE: rounded shoulders, forward head,     UPPER EXTREMITY ROM:   ROM Right Eval A/ PROM Left [place] A/PROM  Shoulder flexion 100/ 125 120/130  Shoulder extension    Shoulder abduction 80/ 130   Shoulder adduction    Shoulder internal rotation 40   Shoulder external rotation 60   Elbow flexion    Elbow extension    Wrist flexion    Wrist extension    Wrist ulnar deviation    Wrist radial deviation    Wrist pronation    Wrist supination    (Blank rows = not tested)  UPPER EXTREMITY MMT: Flexion: 3-/5    Abduction: 3-/5 IR: 4/5 ER: 4/5    SHOULDER SPECIAL TESTS:  JOINT MOBILITY TESTING: hypomobile GHJ, for flex, abd, IR, with pain at end ranges for all motions.   PALPATION: No pain in shoulder or deltoid to palpate. , hypomobile GHJ.  TODAY'S TREATMENT:                                                                                                                                         DATE:   11/28/22: Therapeutic Exercise: Aerobic: Supine: Shoulder flexion and ER with cane 2 x 10, 5 sec holds ;  ER butterfly x 10 , 5 sec holds. ; horiz abd x 15 ROM;   Seated:   pulley/flexion x 3 min;  Standing:   Stretches:   Wall slides x 15 on R;   Neuromuscular Re-education: Manual Therapy:  LAD, PROM R shoulder, all motions.    11/17/21: Therapeutic Exercise: Aerobic: Supine: Shoulder flexion/cane x 10, 5 sec holds ;  ER butterfly x 10 , 5 sec holds.  Seated:  Table slides/flexion x 10;  Standing:  Stretches:    Neuromuscular Re-education: Manual Therapy: LAD, PROM R shoulder, all motions.     PATIENT EDUCATION: Education details: Reviewed and updated HEP Person educated: Patient Education method: Explanation, Demonstration, Tactile cues, Verbal cues, and Handouts Education comprehension: verbalized understanding, returned demonstration, verbal cues required, tactile cues required, and needs further education  HOME EXERCISE PROGRAM: Access Code: [email or web address]   ASSESSMENT:  CLINICAL IMPRESSION:  11/28/2022 Pt with good tolerance for activities today. Has stiffness and pain at end ranges for all motions. Updated HEP for ER. Plan to progress ROM as tolerated. Discussed trying to come 2x/wk if able.   Eval: Patient presents with primary complaint of increased pain in R shoulder.He has significant joint stiffness that is effecting PROM and AROM. He also has pain at end range for all motions. He has difficulty with reaching, carrying, ADLs and IADLs. Pt with decreased ability for full functional activities due to pain, and will benefit from skilled PT to improve.    OBJECTIVE IMPAIRMENTS: decreased mobility, decreased ROM, decreased strength, increased muscle spasms, impaired flexibility, impaired UE functional use, improper body mechanics, and pain.   ACTIVITY LIMITATIONS: carrying, lifting, sitting, sleeping, dressing, reach over head, hygiene/grooming, and locomotion level  PARTICIPATION LIMITATIONS: meal prep, cleaning, laundry, driving, shopping,   PERSONAL FACTORS: none are also affecting patient's functional outcome.   REHAB  POTENTIAL: Good  CLINICAL DECISION MAKING: Stable/uncomplicated  EVALUATION COMPLEXITY: Low  GOALS: Goals reviewed with patient? Yes  SHORT TERM GOALS: Target date: 12/01/2022  Pt to be independent with initial HEP  Goal status: INITIAL  2.  Pt to demo improved PROM for shoulder flexion by at least 5 deg.   Goal status: INITIAL    LONG TERM GOALS: Target date: 01/12/2023  Pt to be independent with final HEP  Goal status: INITIAL  2.  Pt to report decreased pain in R shoulder to 0-2/10 with reaching and IADLs.   Goal status: INITIAL  3.  Pt to demo improved AROM to be WNL to improve ability for ADLs and IADLs.   Goal status: INITIAL  PLAN: PT FREQUENCY: 1-2x/week  PT DURATION: 8 weeks  PLANNED INTERVENTIONS: Therapeutic exercises, Therapeutic activity, Neuromuscular re-education, Patient/Family education, Self Care, Joint mobilization, Joint manipulation, Aquatic Therapy, Dry Needling, Electrical stimulation, Spinal manipulation, Spinal mobilization, Cryotherapy, Moist heat, Taping, Vasopneumatic device, Traction, Ultrasound, Ionotophoresis 29m/ml Dexamethasone, and Manual therapy   PLAN FOR NEXT SESSION:   LLyndee Hensen PT, DPT 10:57 AM  11/28/22

## 2022-12-01 NOTE — Telephone Encounter (Signed)
Pharmacy, please advise on pt's message regarding the [facility] being too expensive. Are their any cheaper alternatives? Thanks.

## 2022-12-05 ENCOUNTER — Ambulatory Visit (INDEPENDENT_AMBULATORY_CARE_PROVIDER_SITE_OTHER): Payer: Commercial Managed Care - HMO | Admitting: Physical Therapy

## 2022-12-05 ENCOUNTER — Other Ambulatory Visit (HOSPITAL_COMMUNITY): Payer: Commercial Managed Care - HMO

## 2022-12-05 ENCOUNTER — Other Ambulatory Visit (HOSPITAL_COMMUNITY): Payer: Self-pay

## 2022-12-05 ENCOUNTER — Encounter: Payer: Self-pay | Admitting: Physical Therapy

## 2022-12-05 DIAGNOSIS — M25611 Stiffness of right shoulder, not elsewhere classified: Secondary | ICD-10-CM | POA: Diagnosis not present

## 2022-12-05 DIAGNOSIS — M25511 Pain in right shoulder: Secondary | ICD-10-CM

## 2022-12-05 NOTE — Therapy (Signed)
OUTPATIENT PHYSICAL THERAPY UPPER EXTREMITY TREATMENT    Patient Name: Robert Yates MRN: [id number] DOB:[birth year], 62 y.o., male Today's Date: 12/05/2022  END OF SESSION:  PT End of Session - 12/05/22 1307     Visit Number 3    Number of Visits 16    Date for PT Re-Evaluation 01/12/23    Authorization Type [employer]    PT Start Time 1305    PT Stop Time 1345    PT Time Calculation (min) 40 min    Activity Tolerance Patient tolerated treatment well    Behavior During Therapy [facility] for tasks assessed/performed             Past Medical History:  Diagnosis Date   Allergy 1970   Anxiety 2021   Aortic atherosclerosis ([place]) 10/20/2022   Noted on 10/2022 LDCT    Asthma 2020   Cataract 2000   COPD  GOLD at least 2  / active smoking  12/16/2019   COPD (chronic obstructive pulmonary disease) (HCC)    GERD (gastroesophageal reflux disease) 2020   Heart murmur 1970   Hyperlipidemia 07/28/2022   The 10-year ASCVD risk score (Arnett DK, et al., 2019) is: 14.9%   Values used to calculate the score:     Age: 5 years     Sex: Male     Is Non-Hispanic African American: No     Diabetic: No     Tobacco smoker: Yes     Systolic Blood Pressure: A999333 mmHg     Is BP treated: No     HDL Cholesterol: 36.5 mg/dL     Total Cholesterol: 214 mg/dL   Started atorvastatin 07/2022   Insomnia 07/28/2022   - he doing much better with seroquel 50-100 nightly, failed many other meds before "only thing that ever worked"   Palpitations    Prediabetes 07/28/2022   Lab Results Component Value Date  HGBA1C 6.1 07/07/2022  HGBA1C 6.0 01/03/2022  HGBA1C 5.7 (H) 12/16/2019     Pulmonary hypertension ([place]) 10/20/2022   On CT lung cancer screening 4. Dilatation of the pulmonic trunk and left main pulmonary artery, which may suggest pulmonary arterial hypertension. Further clinical evaluation is recommended.   Past Surgical History:  Procedure Laterality Date   CHOLECYSTECTOMY N/A 07/14/2021   Procedure:  LAPAROSCOPIC CHOLECYSTECTOMY;  Surgeon: Dwan Bolt, MD;  Location: [place];  Service: General;  Laterality: N/A;   COLONOSCOPY  1994   Dr. Fuller Plan; hyperplastic polyps   COLONOSCOPY WITH PROPOFOL N/A 12/13/2021   Procedure: COLONOSCOPY WITH PROPOFOL;  Surgeon: Eloise Harman, DO;  [facility];  Service: Endoscopy;  Laterality: N/A;  11:00am   POLYPECTOMY  12/13/2021   Procedure: POLYPECTOMY;  Surgeon: Eloise Harman, DO;  [facility];  Service: Endoscopy;;   skin grafts     TONSILLECTOMY     Patient Active Problem List   Diagnosis Date Noted   Cervical spondylosis 11/05/2022   Arm pain, lateral, right 11/01/2022   Polyp of colon 11/01/2022   Lung nodule seen on imaging study 10/20/2022   Pulmonary hypertension ([place]) 10/20/2022   CAD (coronary artery disease) 10/20/2022   Aortic atherosclerosis ([place]) 10/20/2022   Back pain 07/28/2022   Insomnia 07/28/2022   Multiple adenomatous polyps 07/28/2022   Hyperlipidemia 07/28/2022   Prediabetes 07/28/2022   Ruptured lumbar disc 07/28/2022   Murmur 07/28/2022   Skin lesion of face 01/03/2022   Tremor 01/03/2022   History of colon polyps 11/10/2021   Alternating  constipation and diarrhea 11/10/2021   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   Smoker    Elevated LFTs    Cigarette smoker 06/10/2021   Palpitations 06/04/2020   COPD  GOLD at least 2  / active smoking  12/16/2019    PCP: Berniece Pap  REFERRING PROVIDER: [name]  REFERRING DIAG:   THERAPY DIAG:  Acute pain of right shoulder  Stiffness of right shoulder, not elsewhere classified  Rationale for Evaluation and Treatment: Rehabilitation    SUBJECTIVE:                                                                                                                                                                                      SUBJECTIVE STATEMENT: 12/05/2022  No new complaints.   Eval: R upper arm pain and in shoulder. He is R handed.  Started in October, no injury, but did play golf for 1st time in 5 years.  Can now play golf without pain.  Pt is retired.   PERTINENT HISTORY: none  PAIN:  Are you having pain? Yes: NPRS scale: up to 5-10/10 Pain location: R shoulder , upper arm Pain description: Sore, intermittent with certain motions.  Aggravating factors: reaching across, reaching up, reaching out to the side, behind back.  Relieving factors: rest  PRECAUTIONS: None  WEIGHT BEARING RESTRICTIONS: No  FALLS:  Has patient fallen in last 6 months? No  PLOF: Independent  PATIENT GOALS:   NEXT MD VISIT:   OBJECTIVE:   DIAGNOSTIC FINDINGS:    PATIENT SURVEYS :  FOTO: 64.5 (forearm set up )   COGNITION: Overall cognitive status: Within functional limits for tasks assessed     SENSATION: WFL  POSTURE: rounded shoulders, forward head,     UPPER EXTREMITY ROM:   ROM Right Eval A/ PROM Left [place] A/PROM  Shoulder flexion 100/ 125 120/130  Shoulder extension    Shoulder abduction 80/ 130   Shoulder adduction    Shoulder internal rotation 40   Shoulder external rotation 60   Elbow flexion    Elbow extension    Wrist flexion    Wrist extension    Wrist ulnar deviation    Wrist radial deviation    Wrist pronation    Wrist supination    (Blank rows = not tested)  UPPER EXTREMITY MMT: Flexion: 3-/5    Abduction: 3-/5 IR: 4/5 ER: 4/5    SHOULDER SPECIAL TESTS:  JOINT MOBILITY TESTING: hypomobile GHJ, for flex, abd, IR, with pain at end ranges for all motions.   PALPATION: No pain in shoulder or deltoid to palpate. , hypomobile GHJ.    TODAY'S TREATMENT:  DATE:   12/05/22: Therapeutic Exercise: Aerobic: Supine: Shoulder flexion with cane 2 x 10, 5 sec holds ;  Seated:   pulley/flexion and abd  x 2 min ea;  S/L: ER 2 x 10 , 2 lb;  Standing:  Scap  squeeze arms at sides x 15; chin tucks x 15;  Stretches:   Wall slides flex and abd x 15 on R;  IR with stick, extension, then 2 hands x 10 ea; IR behind back 2 hands- reviewed for HEP Neuromuscular Re-education: Manual Therapy:  LAD,  PROM R shoulder, all motions.    11/28/22: Therapeutic Exercise: Aerobic: Supine: Shoulder flexion and ER with cane 2 x 10, 5 sec holds ;  ER butterfly x 10 , 5 sec holds. ; horiz abd x 15 ROM;  Seated:   pulley/flexion x 3 min;  Standing:   Stretches:   Wall slides x 15 on R;   Neuromuscular Re-education: Manual Therapy:  LAD, PROM R shoulder, all motions.    11/17/21: Therapeutic Exercise: Aerobic: Supine: Shoulder flexion/cane x 10, 5 sec holds ;  ER butterfly x 10 , 5 sec holds.  Seated:  Table slides/flexion x 10;  Standing:  Stretches:    Neuromuscular Re-education: Manual Therapy: LAD, PROM R shoulder, all motions.     PATIENT EDUCATION: Education details: Reviewed HEP Person educated: Patient Education method: Explanation, Demonstration, Tactile cues, Verbal cues, and Handouts Education comprehension: verbalized understanding, returned demonstration, verbal cues required, tactile cues required, and needs further education  HOME EXERCISE PROGRAM: Access Code: [email or web address]   ASSESSMENT:  CLINICAL IMPRESSION:  12/05/2022 Pt with good tolerance for activities today. He has improving AROM and PROM. Still limited with ER and IR behind back. Plan to progress as tolerated.   Eval: Patient presents with primary complaint of increased pain in R shoulder.He has significant joint stiffness that is effecting PROM and AROM. He also has pain at end range for all motions. He has difficulty with reaching, carrying, ADLs and IADLs. Pt with decreased ability for full functional activities due to pain, and will benefit from skilled PT to improve.    OBJECTIVE IMPAIRMENTS: decreased mobility, decreased ROM, decreased strength, increased muscle spasms,  impaired flexibility, impaired UE functional use, improper body mechanics, and pain.   ACTIVITY LIMITATIONS: carrying, lifting, sitting, sleeping, dressing, reach over head, hygiene/grooming, and locomotion level  PARTICIPATION LIMITATIONS: meal prep, cleaning, laundry, driving, shopping,   PERSONAL FACTORS: none are also affecting patient's functional outcome.   REHAB POTENTIAL: Good  CLINICAL DECISION MAKING: Stable/uncomplicated  EVALUATION COMPLEXITY: Low  GOALS: Goals reviewed with patient? Yes  SHORT TERM GOALS: Target date: 12/01/2022  Pt to be independent with initial HEP  Goal status: INITIAL  2.  Pt to demo improved PROM for shoulder flexion by at least 5 deg.   Goal status: INITIAL    LONG TERM GOALS: Target date: 01/12/2023  Pt to be independent with final HEP  Goal status: INITIAL  2.  Pt to report decreased pain in R shoulder to 0-2/10 with reaching and IADLs.   Goal status: INITIAL  3.  Pt to demo improved AROM to be WNL to improve ability for ADLs and IADLs.   Goal status: INITIAL   PLAN: PT FREQUENCY: 1-2x/week  PT DURATION: 8 weeks  PLANNED INTERVENTIONS: Therapeutic exercises, Therapeutic activity, Neuromuscular re-education, Patient/Family education, Self Care, Joint mobilization, Joint manipulation, Aquatic Therapy, Dry Needling, Electrical stimulation, Spinal manipulation, Spinal mobilization, Cryotherapy, Moist heat, Taping, Vasopneumatic device, Traction, Ultrasound, Ionotophoresis '4mg'$ /ml Dexamethasone, and Manual  therapy   PLAN FOR NEXT SESSION:   Lyndee Hensen, PT, DPT 1:56 PM  12/05/22

## 2022-12-05 NOTE — Telephone Encounter (Signed)
[employer] 250 is the cheapest alternative and is taken one bid but should work just as well

## 2022-12-05 NOTE — Telephone Encounter (Signed)
Per test claims it seems that the generic Advair Diskus [facility]) is a cheaper alternative at this time.

## 2022-12-05 NOTE — Telephone Encounter (Signed)
Dr. Melvyn Novas, please advise on pt's message and message from pharmacy regarding cheaper alternative to [facility]. Thanks!

## 2022-12-06 ENCOUNTER — Other Ambulatory Visit: Payer: Self-pay

## 2022-12-06 MED ORDER — FLUTICASONE-SALMETEROL 250-50 MCG/ACT IN AEPB: 1.0000 | INHALATION_SPRAY | Freq: Two times a day (BID) | RESPIRATORY_TRACT | 6 refills | Status: DC

## 2022-12-09 DIAGNOSIS — Z419 Encounter for procedure for purposes other than remedying health state, unspecified: Secondary | ICD-10-CM | POA: Diagnosis not present

## 2022-12-12 ENCOUNTER — Encounter: Payer: Self-pay | Admitting: Physical Therapy

## 2022-12-12 ENCOUNTER — Ambulatory Visit (INDEPENDENT_AMBULATORY_CARE_PROVIDER_SITE_OTHER): Payer: Commercial Managed Care - HMO | Admitting: Physical Therapy

## 2022-12-12 DIAGNOSIS — M25511 Pain in right shoulder: Secondary | ICD-10-CM | POA: Diagnosis not present

## 2022-12-12 DIAGNOSIS — M25611 Stiffness of right shoulder, not elsewhere classified: Secondary | ICD-10-CM | POA: Diagnosis not present

## 2022-12-12 NOTE — Therapy (Addendum)
OUTPATIENT PHYSICAL THERAPY UPPER EXTREMITY TREATMENT    Patient Name: Robert Yates MRN: [id number] DOB:[birth year], 62 y.o., male Today's Date: 12/12/2022  END OF SESSION:  PT End of Session - 12/12/22 1146     Visit Number 4    Number of Visits 16    Date for PT Re-Evaluation 01/12/23    Authorization Type [employer]    PT Start Time 1103    PT Stop Time 1144    PT Time Calculation (min) 41 min    Activity Tolerance Patient tolerated treatment well    Behavior During Therapy [facility] for tasks assessed/performed              Past Medical History:  Diagnosis Date   Allergy 1970   Anxiety 2021   Aortic atherosclerosis (HCC) 10/20/2022   Noted on 10/2022 LDCT    Asthma 2020   Cataract 2000   COPD  GOLD at least 2  / active smoking  12/16/2019   COPD (chronic obstructive pulmonary disease) (HCC)    GERD (gastroesophageal reflux disease) 2020   Heart murmur 1970   Hyperlipidemia 07/28/2022   The 10-year ASCVD risk score (Arnett DK, et al., 2019) is: 14.9%   Values used to calculate the score:     Age: 62 years     Sex: Male     Is Non-Hispanic African American: No     Diabetic: No     Tobacco smoker: Yes     Systolic Blood Pressure: 106 mmHg     Is BP treated: No     HDL Cholesterol: 36.5 mg/dL     Total Cholesterol: 214 mg/dL   Started atorvastatin 07/2022   Insomnia 07/28/2022   - he doing much better with seroquel 50-100 nightly, failed many other meds before "only thing that ever worked"   Palpitations    Prediabetes 07/28/2022   Lab Results Component Value Date  HGBA1C 6.1 07/07/2022  HGBA1C 6.0 01/03/2022  HGBA1C 5.7 (H) 12/16/2019     Pulmonary hypertension (HCC) 10/20/2022   On CT lung cancer screening 4. Dilatation of the pulmonic trunk and left main pulmonary artery, which may suggest pulmonary arterial hypertension. Further clinical evaluation is recommended.   Past Surgical History:  Procedure Laterality Date   CHOLECYSTECTOMY N/A 07/14/2021   Procedure:  LAPAROSCOPIC CHOLECYSTECTOMY;  Surgeon: Fritzi Mandes, MD;  Location[facility] OR;  Service: General;  Laterality: N/A;   COLONOSCOPY  1994   Dr. Russella Dar; hyperplastic polyps   COLONOSCOPY WITH PROPOFOL N/A 12/13/2021   Procedure: COLONOSCOPY WITH PROPOFOL;  Surgeon: Lanelle Bal, DO;  Location: [facility]ITE;  Service: Endoscopy;  Laterality: N/A;  11:00am   POLYPECTOMY  12/13/2021   Procedure: POLYPECTOMY;  Surgeon: Lanelle Bal, DO;  Location: [facility] SUITE;  Service: Endoscopy;;   skin grafts     TONSILLECTOMY     Patient Active Problem List   Diagnosis Date Noted   Cervical spondylosis 11/05/2022   Arm pain, lateral, right 11/01/2022   Polyp of colon 11/01/2022   Lung nodule seen on imaging study 10/20/2022   Pulmonary hypertension (HCC) 10/20/2022   CAD (coronary artery disease) 10/20/2022   Aortic atherosclerosis (HCC) 10/20/2022   Back pain 07/28/2022   Insomnia 07/28/2022   Multiple adenomatous polyps 07/28/2022   Hyperlipidemia 07/28/2022   Prediabetes 07/28/2022   Ruptured lumbar disc 07/28/2022   Murmur 07/28/2022   Skin lesion of face 01/03/2022   Tremor 01/03/2022   History of colon polyps 11/10/2021  Alternating constipation and diarrhea 11/10/2021   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   Smoker    Elevated LFTs    Cigarette smoker 06/10/2021   Palpitations 06/04/2020   COPD  GOLD at least 2  / active smoking  12/16/2019    PCP: Glenetta Hew  REFERRING PROVIDER: Clementeen Graham  REFERRING DIAG:   THERAPY DIAG:  Acute pain of right shoulder  Stiffness of right shoulder, not elsewhere classified  Rationale for Evaluation and Treatment: Rehabilitation    SUBJECTIVE:                                                                                                                                                                                      SUBJECTIVE STATEMENT: 12/12/2022  Still having pain with reaching out to the side, and behind the back.   Eval: R  upper arm pain and in shoulder. He is R handed. Started in October, no injury, but did play golf for 1st time in 5 years.  Can now play golf without pain.  Pt is retired.   PERTINENT HISTORY: none  PAIN:  Are you having pain? Yes: NPRS scale: up to 5-10/10 Pain location: R shoulder , upper arm Pain description: Sore, intermittent with certain motions.  Aggravating factors: reaching across, reaching up, reaching out to the side, behind back.  Relieving factors: rest  PRECAUTIONS: None  WEIGHT BEARING RESTRICTIONS: No  FALLS:  Has patient fallen in last 6 months? No  PLOF: Independent  PATIENT GOALS:   NEXT MD VISIT:   OBJECTIVE:   DIAGNOSTIC FINDINGS:    PATIENT SURVEYS :  FOTO: 64.5 (forearm set up )   COGNITION: Overall cognitive status: Within functional limits for tasks assessed     SENSATION: WFL  POSTURE: rounded shoulders, forward head,     UPPER EXTREMITY ROM:   ROM Right Eval A/ PROM Left Carley Hammed A/PROM Right 12/12/22  Shoulder flexion 100/ 125 120/130   Shoulder extension     Shoulder abduction 80/ 130    Shoulder adduction     Shoulder internal rotation 40    Shoulder external rotation 60    Elbow flexion     Elbow extension     Wrist flexion     Wrist extension     Wrist ulnar deviation     Wrist radial deviation     Wrist pronation     Wrist supination     (Blank rows = not tested)  UPPER EXTREMITY MMT: Flexion: 3-/5    Abduction: 3-/5 IR: 4/5 ER: 4/5    SHOULDER SPECIAL TESTS:  JOINT MOBILITY TESTING: hypomobile GHJ, for flex, abd, IR, with pain at end ranges for  all motions.   PALPATION: No pain in shoulder or deltoid to palpate. , hypomobile GHJ.    TODAY'S TREATMENT:                                                                                                                                         DATE:   12/12/22: Therapeutic Exercise: Aerobic: Supine: Shoulder flexion with cane 2 x 10, 5 sec holds ;  ER at 90 deg/ x  20 AROM; supine/angel x 10;  Seated:   pulley/flexion and abd  x 2 min ea;  Standing:  Scap squeeze arms at sides  RTB x 15; Wall slides 1 UE, flex and abd x 10 ea; Trial wall angel/pain.  Stretches:    IR with stick, extension, then 2 hands x 10 ea; IR behind back 2 hands- x 10; Trial for doorway and supine pec stretch-pain.  Neuromuscular Re-education: Manual Therapy:  LAD,  PROM R shoulder, all motions.   12/05/22: Therapeutic Exercise: Aerobic: Supine: Shoulder flexion with cane 2 x 10, 5 sec holds ;  Seated:   pulley/flexion and abd  x 2 min ea;  S/L: ER 2 x 10 , 2 lb;  Standing:  Scap squeeze arms at sides x 15; chin tucks x 15;  Stretches:   Wall slides flex and abd x 15 on R;  IR with stick, extension, then 2 hands x 10 ea; IR behind back 2 hands- reviewed for HEP Neuromuscular Re-education: Manual Therapy:  LAD,  PROM R shoulder, all motions.    11/28/22: Therapeutic Exercise: Aerobic: Supine: Shoulder flexion and ER with cane 2 x 10, 5 sec holds ;  ER butterfly x 10 , 5 sec holds. ; horiz abd x 15 ROM;  Seated:   pulley/flexion x 3 min;  Standing:   Stretches:   Wall slides x 15 on R;   Neuromuscular Re-education: Manual Therapy:  LAD, PROM R shoulder, all motions.    11/17/21: Therapeutic Exercise: Aerobic: Supine: Shoulder flexion/cane x 10, 5 sec holds ;  ER butterfly x 10 , 5 sec holds.  Seated:  Table slides/flexion x 10;  Standing:  Stretches:    Neuromuscular Re-education: Manual Therapy: LAD, PROM R shoulder, all motions.     PATIENT EDUCATION: Education details: Reviewed HEP Person educated: Patient Education method: Explanation, Demonstration, Tactile cues, Verbal cues, and Handouts Education comprehension: verbalized understanding, returned demonstration, verbal cues required, tactile cues required, and needs further education  HOME EXERCISE PROGRAM: Access Code: [email or web address]   ASSESSMENT:  CLINICAL IMPRESSION:  12/12/2022 Pt with much tightness in  pecs, trial for stretch in supine and in doorway, unable due to pain in shoulder/pinching in posterior shoulder. Will continue to benefit from mobility on ER and Abd. Will add thoracic mobility next visit.   Eval: Patient presents with primary complaint of increased pain in R shoulder.He has significant joint stiffness that is effecting PROM and  AROM. He also has pain at end range for all motions. He has difficulty with reaching, carrying, ADLs and IADLs. Pt with decreased ability for full functional activities due to pain, and will benefit from skilled PT to improve.    OBJECTIVE IMPAIRMENTS: decreased mobility, decreased ROM, decreased strength, increased muscle spasms, impaired flexibility, impaired UE functional use, improper body mechanics, and pain.   ACTIVITY LIMITATIONS: carrying, lifting, sitting, sleeping, dressing, reach over head, hygiene/grooming, and locomotion level  PARTICIPATION LIMITATIONS: meal prep, cleaning, laundry, driving, shopping,   PERSONAL FACTORS: none are also affecting patient's functional outcome.   REHAB POTENTIAL: Good  CLINICAL DECISION MAKING: Stable/uncomplicated  EVALUATION COMPLEXITY: Low  GOALS: Goals reviewed with patient? Yes  SHORT TERM GOALS: Target date: 12/01/2022  Pt to be independent with initial HEP  Goal status: INITIAL  2.  Pt to demo improved PROM for shoulder flexion by at least 5 deg.   Goal status: INITIAL    LONG TERM GOALS: Target date: 01/12/2023  Pt to be independent with final HEP  Goal status: INITIAL  2.  Pt to report decreased pain in R shoulder to 0-2/10 with reaching and IADLs.   Goal status: INITIAL  3.  Pt to demo improved AROM to be WNL to improve ability for ADLs and IADLs.   Goal status: INITIAL   PLAN: PT FREQUENCY: 1-2x/week  PT DURATION: 8 weeks  PLANNED INTERVENTIONS: Therapeutic exercises, Therapeutic activity, Neuromuscular re-education, Patient/Family education, Self Care, Joint  mobilization, Joint manipulation, Aquatic Therapy, Dry Needling, Electrical stimulation, Spinal manipulation, Spinal mobilization, Cryotherapy, Moist heat, Taping, Vasopneumatic device, Traction, Ultrasound, Ionotophoresis 4mg /ml Dexamethasone, and Manual therapy   PLAN FOR NEXT SESSION:  Thoracic rotation/mobility, abd and ER ROM.   Sedalia Muta, PT, DPT 11:46 AM  12/12/22  PHYSICAL THERAPY DISCHARGE SUMMARY  Visits from Start of Care: 4   Plan: Patient agrees to discharge.  Patient goals were  met. Patient is being discharged due to meeting the stated rehab goals.     Sedalia Muta, PT, DPT 3:09 PM  05/11/23

## 2022-12-19 ENCOUNTER — Encounter: Payer: Commercial Managed Care - HMO | Admitting: Physical Therapy

## 2022-12-22 ENCOUNTER — Ambulatory Visit: Payer: Commercial Managed Care - HMO | Admitting: Family Medicine

## 2022-12-26 ENCOUNTER — Encounter: Payer: Self-pay | Admitting: Physical Therapy

## 2022-12-26 ENCOUNTER — Ambulatory Visit: Payer: Commercial Managed Care - HMO | Admitting: Physical Therapy

## 2022-12-26 NOTE — Therapy (Signed)
This encounter was created in error - please disregard.

## 2022-12-28 ENCOUNTER — Ambulatory Visit (INDEPENDENT_AMBULATORY_CARE_PROVIDER_SITE_OTHER): Payer: Commercial Managed Care - HMO | Admitting: Family Medicine

## 2022-12-28 ENCOUNTER — Encounter: Payer: Self-pay | Admitting: Family Medicine

## 2022-12-28 VITALS — BP 122/72 | HR 81 | Ht 74.0 in | Wt 178.4 lb

## 2022-12-28 DIAGNOSIS — G8929 Other chronic pain: Secondary | ICD-10-CM | POA: Diagnosis not present

## 2022-12-28 DIAGNOSIS — M25511 Pain in right shoulder: Secondary | ICD-10-CM

## 2022-12-28 DIAGNOSIS — M545 Low back pain, unspecified: Secondary | ICD-10-CM | POA: Diagnosis not present

## 2022-12-28 MED ORDER — TIZANIDINE HCL 2 MG PO TABS: 2.0000 mg | ORAL_TABLET | Freq: Three times a day (TID) | ORAL | 1 refills | Status: DC | PRN

## 2022-12-28 NOTE — Progress Notes (Signed)
I, Robert Yates, CMA acting as a scribe for Robert Leader, MD.  Robert Yates is a 62 y.o. male who presents to [place] [facility] today for 6-week follow-up right upper arm pain.  Patient was last seen by Dr. Georgina Yates on 11/08/2022 and was referred to PT, completing 4 visits.  Today, patient reports improvement of right arm sx with Physical Therapy. Unable to see PT at [place] because they don't accept his insurance. Needs to know if he should continue PT or just HEP. Notes 99% improvement of sx.   He notes that he will he notes he will have occasional episodes of back pain flareups and he will typically benefit from muscle relaxers as needed.  He does not have a muscle relaxer prescription handy.  He often will take " anti-inflammatories at the same time".   Dx imaging: 11/02/2022 C-spine, R humerus, & R shoulder x-ray   Pertinent review of systems: No fevers or chills  Relevant historical information: COPD.  Pulmonary hypertension.  CAD.   Exam:  BP 122/72   Pulse 81   Ht 6\' 2"  (1.88 m)   Wt 178 lb 6.4 oz (80.9 kg)   SpO2 96%   BMI 22.91 kg/m  General: Well Developed, well nourished, and in no acute distress.   MSK: Right shoulder: Normal-appearing normal motion intact strength negative impingement testing.  L-spine normal motion.    Lab and Radiology Results  EXAM: RIGHT SHOULDER - 2+ VIEW   COMPARISON:  None Available.   FINDINGS: There is no evidence of fracture or dislocation. There is no evidence of arthropathy or other focal bone abnormality. Soft tissues are unremarkable.   IMPRESSION: Negative.     Electronically Signed   By: Dorise Bullion III M.D.   On: 11/03/2022 13:08   I, Robert Yates, personally (independently) visualized and performed the interpretation of the images attached in this note.      Assessment and Plan: 62 y.o. male with right shoulder.  Almost completely better with physical therapy.  I have  communicated with physical therapist Lyndee Hensen DPT.  She thinks he could use was few more sessions but there is an insurance issue at her location and recommended the [place] location.  I think this would be a great idea.  However he is almost completely pain-free.  Plan for home exercise program and watchful waiting.  If his pain starts coming back he will let me know and I will order physical therapy to the [place] location.  Occasional back pain episodes.  I do not have a updated L-spine image.  He does have lumbar spine MRI from 2011 that shows DDD.  I think most likely explanation for his flareup episodes are muscle spasm and dysfunction.  Self treatment with heating pad Tylenol and muscle relaxers is reasonable.  Recommend avoiding high-dose NSAIDs given his coronary artery disease and pulmonary hypertension.  If needed he can schedule with me for recheck or I can order physical therapy.  He will let me know.   PDMP not reviewed this encounter. No orders of the defined types were placed in this encounter.  Meds ordered this encounter  Medications   tiZANidine (ZANAFLEX) 2 MG tablet    Sig: Take 1-2 tablets (2-4 mg total) by mouth every 8 (eight) hours as needed.    Dispense:  60 tablet    Refill:  1     Discussed warning signs or symptoms. Please see discharge instructions. Patient expresses  understanding.   The above documentation has been reviewed and is accurate and complete Robert Yates, M.D.

## 2022-12-28 NOTE — Patient Instructions (Signed)
Thank you for coming in today.   Ok to continue home exercises.  If the pain returns let me know with a mychart message and ask for a PT order to [place] and we can get that set up.

## 2023-01-03 ENCOUNTER — Telehealth (INDEPENDENT_AMBULATORY_CARE_PROVIDER_SITE_OTHER): Payer: Self-pay | Admitting: *Deleted

## 2023-01-03 NOTE — Telephone Encounter (Signed)
  Procedure: Colonoscopy  Height: 6'2 Weight: 178lbs       Have you had a colonoscopy before?  12/13/21, Dr. Abbey Chatters  Do you have family history of colon cancer?  no  Do you have a family history of polyps? no  Previous colonoscopy with polyps removed? yes  Do you have a history colorectal cancer?   no  Are you diabetic?  no  Do you have a prosthetic or mechanical heart valve? no  Do you have a pacemaker/defibrillator?   no  Have you had endocarditis/atrial fibrillation?  no  Do you use supplemental oxygen/CPAP?  no  Have you had joint replacement within the last 12 months?  no  Do you tend to be constipated or have to use laxatives?  no   Do you have history of alcohol use? If yes, how much and how often.  no  Do you have history or are you using drugs? If yes, what do are you  using?  no  Have you ever had a stroke/heart attack?  no  Have you ever had a heart or other vascular stent placed,?no  Do you take weight loss medication? no  Do you take any blood-thinning medications such as: (Plavix, aspirin, Coumadin, Aggrenox, Brilinta, Xarelto, Eliquis, Pradaxa, Savaysa or Effient)? aspirin  If yes we need the name, milligram, dosage and who is prescribing doctor:               Current Outpatient Medications  Medication Sig Dispense Refill   acetaminophen (TYLENOL) 500 MG tablet Take 1,000 mg by mouth every 6 (six) hours as needed (pain/headaches).     aspirin EC 81 MG tablet Take 1 tablet (81 mg total) by mouth daily. Swallow whole. 30 tablet 12   fluticasone furoate-vilanterol (BREO ELLIPTA) 100-25 MCG/ACT AEPB Inhale 1 puff into the lungs daily. 60 each 11   Ibuprofen-diphenhydrAMINE HCl (IBUPROFEN PM) 200-25 MG CAPS Take 2 tablets by mouth at bedtime.     metoprolol tartrate (LOPRESSOR) 25 MG tablet Take 0.5 tablets (12.5 mg total) by mouth 2 (two) times daily. 180 tablet 0   QUEtiapine (SEROQUEL) 100 MG tablet TAKE 1/2 TO 1 TABLET(50 TO 100 MG) BY MOUTH AT BEDTIME 30  tablet 2   rosuvastatin (CRESTOR) 40 MG tablet Take 1 tablet (40 mg total) by mouth daily. Replaces 20 mg dose, for coronary artery disease 90 tablet 3   tiZANidine (ZANAFLEX) 2 MG tablet Take 1-2 tablets (2-4 mg total) by mouth every 8 (eight) hours as needed. 60 tablet 1   No current facility-administered medications for this visit.    Allergies  Allergen Reactions   Lipitor [Atorvastatin] Other (See Comments)    headaches   Sulfa Antibiotics Rash

## 2023-01-09 DIAGNOSIS — Z419 Encounter for procedure for purposes other than remedying health state, unspecified: Secondary | ICD-10-CM | POA: Diagnosis not present

## 2023-01-23 NOTE — Telephone Encounter (Signed)
LMOVM to call back 

## 2023-01-23 NOTE — Telephone Encounter (Signed)
Numerous polyps in 2023 (19). One was 10 mm. Tubular adenomas and sessile serrated. 1 year surveillance due. ASA 2. Appropriate.

## 2023-01-26 NOTE — Telephone Encounter (Signed)
Letter mailed

## 2023-01-26 NOTE — Telephone Encounter (Signed)
Pt returned call. Stated he will have to see who can bring him and when. He will call back

## 2023-01-27 ENCOUNTER — Ambulatory Visit: Payer: Medicaid Other | Admitting: Internal Medicine

## 2023-01-30 NOTE — Telephone Encounter (Signed)
Pt left vm to schedule his colonoscopy   [facility]

## 2023-02-01 NOTE — Telephone Encounter (Signed)
Pt left vm returning call  LMTRC 

## 2023-02-02 MED ORDER — CLENPIQ 10-3.5-12 MG-GM -GM/175ML PO SOLN: 1.0000 | ORAL | 0 refills | Status: DC

## 2023-02-02 NOTE — Telephone Encounter (Signed)
Patient called back. Scheduled for 5/21 at 2pm. Aware will send instructions via mychart. Rx for prep sent to pharmacy.

## 2023-02-02 NOTE — Addendum Note (Signed)
Addended by: Armstead Peaks on: 02/02/2023 08:41 AM   Modules accepted: Orders

## 2023-02-08 DIAGNOSIS — Z419 Encounter for procedure for purposes other than remedying health state, unspecified: Secondary | ICD-10-CM | POA: Diagnosis not present

## 2023-02-12 ENCOUNTER — Other Ambulatory Visit: Payer: Self-pay | Admitting: Family Medicine

## 2023-02-12 DIAGNOSIS — G479 Sleep disorder, unspecified: Secondary | ICD-10-CM

## 2023-02-13 ENCOUNTER — Encounter: Payer: Self-pay | Admitting: Internal Medicine

## 2023-02-13 ENCOUNTER — Ambulatory Visit (INDEPENDENT_AMBULATORY_CARE_PROVIDER_SITE_OTHER): Payer: Commercial Managed Care - HMO | Admitting: Internal Medicine

## 2023-02-13 VITALS — BP 100/70 | HR 70 | Temp 97.6°F | Ht 74.0 in | Wt 175.6 lb

## 2023-02-13 DIAGNOSIS — D493 Neoplasm of unspecified behavior of breast: Secondary | ICD-10-CM

## 2023-02-13 DIAGNOSIS — R7989 Other specified abnormal findings of blood chemistry: Secondary | ICD-10-CM | POA: Diagnosis not present

## 2023-02-13 DIAGNOSIS — G4700 Insomnia, unspecified: Secondary | ICD-10-CM | POA: Diagnosis not present

## 2023-02-13 DIAGNOSIS — M47812 Spondylosis without myelopathy or radiculopathy, cervical region: Secondary | ICD-10-CM | POA: Diagnosis not present

## 2023-02-13 DIAGNOSIS — I2510 Atherosclerotic heart disease of native coronary artery without angina pectoris: Secondary | ICD-10-CM

## 2023-02-13 DIAGNOSIS — G479 Sleep disorder, unspecified: Secondary | ICD-10-CM | POA: Diagnosis not present

## 2023-02-13 DIAGNOSIS — F17210 Nicotine dependence, cigarettes, uncomplicated: Secondary | ICD-10-CM | POA: Diagnosis not present

## 2023-02-13 DIAGNOSIS — I2584 Coronary atherosclerosis due to calcified coronary lesion: Secondary | ICD-10-CM

## 2023-02-13 DIAGNOSIS — R002 Palpitations: Secondary | ICD-10-CM

## 2023-02-13 DIAGNOSIS — E785 Hyperlipidemia, unspecified: Secondary | ICD-10-CM | POA: Diagnosis not present

## 2023-02-13 DIAGNOSIS — Z86010 Personal history of colonic polyps: Secondary | ICD-10-CM | POA: Diagnosis not present

## 2023-02-13 MED ORDER — QUETIAPINE FUMARATE 100 MG PO TABS
ORAL_TABLET | ORAL | 2 refills | Status: DC
Start: 2023-02-13 — End: 2023-05-01

## 2023-02-13 MED ORDER — METOPROLOL TARTRATE 25 MG PO TABS
12.5000 mg | ORAL_TABLET | Freq: Two times a day (BID) | ORAL | 3 refills | Status: AC
Start: 2023-02-13 — End: ?

## 2023-02-13 NOTE — Assessment & Plan Note (Signed)
Palpated. it does feel like a lipoma but given the amount of growth I believe it should be removed and biopsied with the removal

## 2023-02-13 NOTE — Assessment & Plan Note (Signed)
Assessment:  Well-controlled thanks to seroquel   Insomnia associated with snoring    Plan:  Continue seroquel after discussion  Discussed sleep hygiene measures including regular sleep schedule, optimal sleep environment, and relaxing presleep rituals. Avoid daytime naps. Avoid caffeine after noon. Avoid excess alcohol. Avoid tobacco. Recommended daily exercise. Transport planner distributed. We briefly discussed the potential benefits of behavioral therapy and sleep study but he expressed a preference to not move forward with my offer(s) to facilitate the proposed intervention(s) at this time. We will revisit this option if needed at future visits and  he is always encouraged to contact me if the situation changes or he changes his mind.   Through a comprehensive analysis of anonymized patient data, we informed an AI-generated report. All AI suggestions underwent careful evaluation against established guidelines to ensure optimal patient care. During our collaborative review of the report's findings, we discussed potential diagnoses, recommended tests, treatment options, and next steps. I explained the rationale behind the AI recommendations, integrating my clinical judgment for the patient's understanding.  In collaboration with the patient, we explored the potential benefits and risks associated with certain categories of AI-suggested interventions (switching seroquel to melatonin and using behavioral health counselor for cbt treatment(s) for insomnia). For example, we discussed the advantages and considerations of more aggressive treatment options. Ultimately, the patient elected to closely monitor the situation based on their preferences and our shared understanding of their condition. Patient's best interests and evidence-based practices guided all decisions, respecting their autonomy. Moving forward, we will continue close monitoring and revisit these options at the next visit or  sooner if their condition changes. The patient was encouraged to keep me informed of any developments."  AI-Generated Report (red):   The Question  What are the important long term side effects of Seroquel that make it a poor choice for long term management of insomnia and what should be used instead in geriatric patients.  Design Strategy  To systematically address this question, we will first identify the long-term side effects of Seroquel (quetiapine) that are particularly concerning for the management of insomnia, especially in geriatric patients. Following this, we will explore alternative treatment options that are considered safer and more appropriate for long-term management of insomnia in this population. Our analysis will be grounded in evidence-based medicine and clinical guidelines to ensure accuracy and relevance.  Execute Strategy  Quetiapine (Seroquel) is an atypical antipsychotic medication that is sometimes used off-label for the management of insomnia. However, its long-term use, especially in geriatric patients, is associated with several concerning side effects:  1. Metabolic Syndrome: Quetiapine can induce weight gain, hyperglycemia, and dyslipidemia, contributing to the development of metabolic syndrome, which is particularly problematic in the elderly due to their increased baseline risk for these conditions.  2. Cardiovascular Effects: It can cause orthostatic hypotension and, in rare cases, prolongation of the QT interval, leading to an increased risk of falls and cardiac arrhythmias, respectively.  3. Neurological Effects: There is a risk of extrapyramidal symptoms and, more importantly, an increased risk of tardive dyskinesia with long-term use. Additionally, quetiapine can lead to sedation and cognitive impairment, exacerbating geriatric concerns such as dementia and delirium.  4. Increased Mortality in Elderly Patients with Dementia-Related Psychosis: The FDA has  issued a black box warning regarding the increased risk of death associated with the use of antipsychotics in elderly patients with dementia-related psychosis.  Given these risks, alternative treatments for insomnia in geriatric patients should be considered. Non-pharmacological interventions, particularly cognitive-behavioral therapy for  insomnia (CBT-I), are recommended as the first-line treatment due to their efficacy and absence of adverse effects. If medication is necessary, short-term use of non-benzodiazepine hypnotics (e.g., zolpidem, eszopiclone) may be considered, keeping in mind their potential for dependence and adverse effects. Melatonin and melatonin receptor agonists (e.g., ramelteon) can also be considered due to their favorable safety profile in the elderly.  Systematically Ensure Accuracy & Precision  Upon re-evaluation, the recommendation against the long-term use of quetiapine for insomnia in geriatric patients is supported by its profile of potential adverse effects, which can significantly impact the health and quality of life in this population. The emphasis on non-pharmacological interventions aligns with clinical guidelines advocating for safer and more sustainable approaches to managing insomnia. When pharmacotherapy is indicated, the selection of agents with a lower risk profile for the elderly is crucial.  Final Answer  The long-term use of Seroquel (quetiapine) for the management of insomnia in geriatric patients is not recommended due to its association with metabolic syndrome, cardiovascular effects, neurological effects, and an increased risk of mortality in elderly patients with dementia-related psychosis. Instead, cognitive-behavioral therapy for insomnia (CBT-I) should be prioritized as the first-line treatment. If medication is necessary, options with a safer profile for the elderly, such as non-benzodiazepine hypnotics or melatonin receptor agonists, should be  considered.

## 2023-02-13 NOTE — Progress Notes (Signed)
[facility]: [phone]   Routine Medical Office Visit  Patient:  Robert Yates      Age: 62 y.o.       Sex:  male  Date:   02/13/2023 PCP:    Lula Olszewski, MD   Today's Healthcare Provider: Lula Olszewski, MD       Assessment and Plan:   Insomnia, unspecified type Assessment & Plan:  Assessment:  Well-controlled thanks to seroquel   Insomnia associated with snoring    Plan:  Continue seroquel after discussion  Discussed sleep hygiene measures including regular sleep schedule, optimal sleep environment, and relaxing presleep rituals. Avoid daytime naps. Avoid caffeine after noon. Avoid excess alcohol. Avoid tobacco. Recommended daily exercise. Transport planner distributed. We briefly discussed the potential benefits of behavioral therapy and sleep study but he expressed a preference to not move forward with my offer(s) to facilitate the proposed intervention(s) at this time. We will revisit this option if needed at future visits and  he is always encouraged to contact me if the situation changes or he changes his mind.   Through a comprehensive analysis of anonymized patient data, we informed an AI-generated report. All AI suggestions underwent careful evaluation against established guidelines to ensure optimal patient care. During our collaborative review of the report's findings, we discussed potential diagnoses, recommended tests, treatment options, and next steps. I explained the rationale behind the AI recommendations, integrating my clinical judgment for the patient's understanding.  In collaboration with the patient, we explored the potential benefits and risks associated with certain categories of AI-suggested interventions (switching seroquel to melatonin and using behavioral health counselor for cbt treatment(s) for insomnia). For example, we discussed the advantages and considerations of more aggressive treatment options. Ultimately,  the patient elected to closely monitor the situation based on their preferences and our shared understanding of their condition. Patient's best interests and evidence-based practices guided all decisions, respecting their autonomy. Moving forward, we will continue close monitoring and revisit these options at the next visit or sooner if their condition changes. The patient was encouraged to keep me informed of any developments."  AI-Generated Report (red):   The Question  What are the important long term side effects of Seroquel that make it a poor choice for long term management of insomnia and what should be used instead in geriatric patients.  Design Strategy  To systematically address this question, we will first identify the long-term side effects of Seroquel (quetiapine) that are particularly concerning for the management of insomnia, especially in geriatric patients. Following this, we will explore alternative treatment options that are considered safer and more appropriate for long-term management of insomnia in this population. Our analysis will be grounded in evidence-based medicine and clinical guidelines to ensure accuracy and relevance.  Execute Strategy  Quetiapine (Seroquel) is an atypical antipsychotic medication that is sometimes used off-label for the management of insomnia. However, its long-term use, especially in geriatric patients, is associated with several concerning side effects:  1. Metabolic Syndrome: Quetiapine can induce weight gain, hyperglycemia, and dyslipidemia, contributing to the development of metabolic syndrome, which is particularly problematic in the elderly due to their increased baseline risk for these conditions.  2. Cardiovascular Effects: It can cause orthostatic hypotension and, in rare cases, prolongation of the QT interval, leading to an increased risk of falls and cardiac arrhythmias, respectively.  3. Neurological Effects: There is a risk of  extrapyramidal symptoms and, more importantly, an increased risk of tardive dyskinesia with long-term  use. Additionally, quetiapine can lead to sedation and cognitive impairment, exacerbating geriatric concerns such as dementia and delirium.  4. Increased Mortality in Elderly Patients with Dementia-Related Psychosis: The FDA has issued a black box warning regarding the increased risk of death associated with the use of antipsychotics in elderly patients with dementia-related psychosis.  Given these risks, alternative treatments for insomnia in geriatric patients should be considered. Non-pharmacological interventions, particularly cognitive-behavioral therapy for insomnia (CBT-I), are recommended as the first-line treatment due to their efficacy and absence of adverse effects. If medication is necessary, short-term use of non-benzodiazepine hypnotics (e.g., zolpidem, eszopiclone) may be considered, keeping in mind their potential for dependence and adverse effects. Melatonin and melatonin receptor agonists (e.g., ramelteon) can also be considered due to their favorable safety profile in the elderly.  Systematically Ensure Accuracy & Precision  Upon re-evaluation, the recommendation against the long-term use of quetiapine for insomnia in geriatric patients is supported by its profile of potential adverse effects, which can significantly impact the health and quality of life in this population. The emphasis on non-pharmacological interventions aligns with clinical guidelines advocating for safer and more sustainable approaches to managing insomnia. When pharmacotherapy is indicated, the selection of agents with a lower risk profile for the elderly is crucial.  Final Answer  The long-term use of Seroquel (quetiapine) for the management of insomnia in geriatric patients is not recommended due to its association with metabolic syndrome, cardiovascular effects, neurological effects, and an increased risk of  mortality in elderly patients with dementia-related psychosis. Instead, cognitive-behavioral therapy for insomnia (CBT-I) should be prioritized as the first-line treatment. If medication is necessary, options with a safer profile for the elderly, such as non-benzodiazepine hypnotics or melatonin receptor agonists, should be considered.   Sleep disturbance -     QUEtiapine Fumarate; TAKE 1/2 TO 1 TABLET(50 TO 100 MG) BY MOUTH AT BEDTIME  Dispense: 30 tablet; Refill: 2 -     TSH; Future  Palpitations -     Metoprolol Tartrate; Take 0.5 tablets (12.5 mg total) by mouth 2 (two) times daily.  Dispense: 180 tablet; Refill: 3  Coronary artery disease due to calcified coronary lesion Assessment & Plan: Encouraged continuing with aspirin and rosuvastatin Well-controlled, patient expresses that there is not any chest pain or symptom(s) Encouraged continuing with follow up Cardiologist Rollene Rotunda, MD   Orders: -     CBC; Future -     Differential; Future -     Comprehensive metabolic panel; Future  Cervical spondylosis  Cigarette smoker Assessment & Plan: Explained he would be better off using almost any nicotine product than cigarettes   Elevated LFTs  History of colon polyps  Breast tumor Assessment & Plan: Palpated. it does feel like a lipoma but given the amount of growth I believe it should be removed and biopsied with the removal  Orders: -     MM Digital Diagnostic Bilat; Future -     [place] LIMITED ULTRASOUND INCLUDING AXILLA LEFT BREAST ; Future -     Ambulatory referral to General Surgery; Future -     Lipid panel; Future  Hyperlipidemia, unspecified hyperlipidemia type    Treatment plan discussed and reviewed in detail. Explained medication safety and potential side effects. Agreed on patient returning to office if symptoms worsen, persist, or new symptoms develop. Discussed precautions in case of needing to visit the Emergency Department. Answered all patient questions  and confirmed understanding and comfort with the plan. Encouraged patient to contact our office if they  have any questions or concerns.        Clinical Presentation:   62 y.o. male here today for 6 month follow-up and Insomnia (Doesn't sleep well without seroquel, has been on about a year)  HPI   Updated chart data:  Problem  Breast Tumor   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 that started out about the size of a pea maybe 6 to 7 years ago but now is grown to about the size of a ping pong ball   Cervical Spondylosis   Interim history: "no trouble with it" lately   Cad (Coronary Artery Disease)   Patient reports that they are consistently taking: aspirin 81 rosuvastatin 40   Prior history: On LDCT 10/2022 left anterior descending coronary artery disease.  he calcifications look extensive throughout proximal coronary circulation, discussed coronary artery disease and smoking and diet- Held off on ozempic Evaluated by Dr. Antoine Poche cardiology \   Insomnia   - he doing much better with seroquel 50-100 nightly, failed many other meds before "only thing that ever worked" Subjective:     NORIEL CLAYPOOL is a 62 y.o. male who complains of insomnia. Onset wasyears ago. Patient describes symptoms as early morning awakening and frequent night time awakening. Patient has found complete relief with seroquel but not any conservative measures. Associated symptoms include: none. Patient denies anxiety, depression, fatigue, frequent nighttime urination, irritability, leg cramps, and restless legs. Symptoms have been well-controlled.   Hyperlipidemia   Statin myopathy history: no,  Current Regimen: rosuvastatin 40 Diet and exercise   Lab Results  Component Value Date   CHOL 214 (H) 07/07/2022   CHOL 202 (H) 01/03/2022   CHOL 211 (H) 12/16/2019   Lab Results  Component Value Date   HDL 36.50 (L) 07/07/2022   HDL 39.30 01/03/2022   HDL 46 12/16/2019   Lab Results  Component Value  Date   LDLCALC 123 (H) 01/03/2022   LDLCALC 135 (H) 12/16/2019   Lab Results  Component Value Date   TRIG 340.0 (H) 07/07/2022   TRIG 199.0 (H) 01/03/2022   TRIG 163 (H) 12/16/2019   Lab Results  Component Value Date   CHOLHDL 6 07/07/2022   CHOLHDL 5 01/03/2022   CHOLHDL 4.6 12/16/2019   Lab Results  Component Value Date   LDLDIRECT 148.0 07/07/2022    The 10-year ASCVD risk score (Arnett DK, et al., 2019) is: 14.1%   Values used to calculate the score:     Age: 32 years     Sex: Male     Is Non-Hispanic African American: No     Diabetic: No     Tobacco smoker: Yes     Systolic Blood Pressure: 100 mmHg     Is BP treated: No     HDL Cholesterol: 36.5 mg/dL     Total Cholesterol: 214 mg/dL    History of Colon Polyps   Planned for 2024 colonoscopy Gastroenterologist:  Lanelle Bal, DO    Cigarette Smoker   He has been cutting back he is down to 1 pack a day but has smoked as much is 2-1/2 packs in a day in the past on a stressful day but probably never more than 2 a day on average.  Started smoking at age 82.  Reports he is out of date on yearly CAT scan screening Has tried patches and gum Knows people with bad reaction to Chantix.   Arm Pain, Lateral, Right (Resolved)   I think  this is musculoskeletal in the right upper humerus but will get XR and refer to sports medicine.   Biliary Anomaly (Resolved)   Posterior division RIGHT hepatic duct with low insertion hepatic ducton common just above the cystic duct   Elevated Lfts (Resolved)   Lab Results  Component Value Date/Time   ALT 27 07/07/2022 02:46 PM   ALT 26 01/03/2022 10:51 AM   ALT 23 11/25/2021 01:10 PM   ALT 262 (H) 06/28/2021 01:41 PM   ALT 480 (H) 06/25/2021 06:09 AM   Lab Results  Component Value Date/Time   AST 21 07/07/2022 02:46 PM   AST 18 01/03/2022 10:51 AM   AST 16 11/25/2021 01:10 PM   AST 87 (H) 06/28/2021 01:41 PM   AST 267 (H) 06/25/2021 06:09 AM   Lab Results  Component Value  Date/Time   ALKPHOS 66 07/07/2022 02:46 PM   ALKPHOS 69 01/03/2022 10:51 AM   ALKPHOS 154 (H) 06/25/2021 06:09 AM   ALKPHOS 96 06/24/2021 04:21 AM   ALKPHOS 97 06/23/2021 01:55 PM   No components found for: "BILIT" No results found for: "LABGGT"        Reviewed chart data: Active Ambulatory Problems    Diagnosis Date Noted   COPD  GOLD at least 2  / active smoking  12/16/2019   Palpitations 06/04/2020   Cigarette smoker 06/10/2021   Smoker    History of colon polyps 11/10/2021   Alternating constipation and diarrhea 11/10/2021   Skin lesion of face 01/03/2022   Tremor 01/03/2022   Back pain 07/28/2022   Insomnia 07/28/2022   Multiple adenomatous polyps 07/28/2022   Hyperlipidemia 07/28/2022   Prediabetes 07/28/2022   Ruptured lumbar disc 07/28/2022   Murmur 07/28/2022   Lung nodule seen on imaging study 10/20/2022   Pulmonary hypertension (HCC) 10/20/2022   CAD (coronary artery disease) 10/20/2022   Aortic atherosclerosis (HCC) 10/20/2022   Polyp of colon 11/01/2022   Cervical spondylosis 11/05/2022   Breast tumor 02/13/2023   Resolved Ambulatory Problems    Diagnosis Date Noted   Educated about COVID-19 virus infection 06/04/2020   Acute cholecystitis 06/23/2021   Cholecystitis 06/23/2021   Common bile duct (CBD) obstruction    RUQ pain    Elevated LFTs    Calculus of gallbladder with biliary obstruction but without cholecystitis    Calculus of gallbladder without cholecystitis without obstruction    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   Sleep disturbance 01/03/2022   Abdominal pain 07/07/2021   Arm pain, lateral, right 11/01/2022   Past Medical History:  Diagnosis Date   Allergy 1970   Anxiety 2021   Asthma 2020   Cataract 2000   COPD (chronic obstructive pulmonary disease) (HCC)    GERD (gastroesophageal reflux disease) 2020   Heart murmur 1970    Outpatient Medications Prior to Visit  Medication Sig   acetaminophen (TYLENOL) 500 MG tablet Take 1,000 mg by  mouth every 6 (six) hours as needed (pain/headaches).   aspirin EC 81 MG tablet Take 1 tablet (81 mg total) by mouth daily. Swallow whole.   fluticasone furoate-vilanterol (BREO ELLIPTA) 100-25 MCG/ACT AEPB Inhale 1 puff into the lungs daily.   Ibuprofen-diphenhydrAMINE HCl (IBUPROFEN PM) 200-25 MG CAPS Take 2 tablets by mouth at bedtime.   rosuvastatin (CRESTOR) 40 MG tablet Take 1 tablet (40 mg total) by mouth daily. Replaces 20 mg dose, for coronary artery disease   Sod Picosulfate-Mag Ox-Cit Acd (CLENPIQ) 10-3.5-12 MG-GM -GM/175ML SOLN Take 1 kit by mouth as directed.  tiZANidine (ZANAFLEX) 2 MG tablet Take 1-2 tablets (2-4 mg total) by mouth every 8 (eight) hours as needed.   [DISCONTINUED] metoprolol tartrate (LOPRESSOR) 25 MG tablet Take 0.5 tablets (12.5 mg total) by mouth 2 (two) times daily.   [DISCONTINUED] QUEtiapine (SEROQUEL) 100 MG tablet TAKE 1/2 TO 1 TABLET(50 TO 100 MG) BY MOUTH AT BEDTIME   No facility-administered medications prior to visit.           Clinical Data Analysis:   Physical Exam  BP 100/70 (BP Location: Left Arm, Patient Position: Sitting)   Pulse 70   Temp 97.6 F (36.4 C) (Temporal)   Ht 6\' 2"  (1.88 m)   Wt 175 lb 9.6 oz (79.7 kg)   SpO2 97%   BMI 22.55 kg/m  Wt Readings from Last 10 Encounters:  02/13/23 175 lb 9.6 oz (79.7 kg)  12/28/22 178 lb 6.4 oz (80.9 kg)  11/15/22 179 lb 6.4 oz (81.4 kg)  11/08/22 181 lb (82.1 kg)  11/01/22 179 lb 9.6 oz (81.5 kg)  07/28/22 173 lb 3.2 oz (78.6 kg)  07/07/22 172 lb 9.6 oz (78.3 kg)  02/21/22 174 lb (78.9 kg)  01/03/22 174 lb (78.9 kg)  12/09/21 173 lb (78.5 kg)   Vital signs reviewed.  Nursing notes reviewed. Weight trend reviewed. Abnormalities and Problem-Specific physical exam findings:  relaxed, polite General Appearance:  No acute distress appreciable.   Well-groomed, healthy-appearing male.  Well proportioned with no abnormal fat distribution.  Good muscle tone. Skin: Clear and  well-hydrated. Pulmonary:  Normal work of breathing at rest, no respiratory distress apparent. SpO2: 97 %  Musculoskeletal: All extremities are intact.  Neurological:  Awake, alert, oriented, and engaged.  No obvious focal neurological deficits or cognitive impairments.  Sensorium seems unclouded.   Speech is clear and coherent with logical content. Psychiatric:  Appropriate mood, pleasant and cooperative demeanor, thoughtful and engaged during the exam   Additional Results Reviewed:     No results found for any visits on 02/13/23.  Recent Results (from the past 2160 hour(s))  Exercise Tolerance Test     Status: None   Collection Time: 11/24/22 12:02 PM  Result Value Ref Range   Angina Index 0    Rest HR 92.0 bpm   Rest BP 142/79 mmHg   Exercise duration (min) 3 min   Exercise duration (sec) 0 sec   Estimated workload 4.6    Peak HR 144 bpm   Peak BP 205/81 mmHg   MPHR 156 bpm   Percent HR 92.0 %   RPE 15.0    Base ST Depression (mm) 0 mm   [facility] Treadmill Score 3    ST Depression (mm) 0 mm    No image results found.   Exercise Tolerance Test  Result Date: 11/24/2022   Baseline ECG is normal. ECG rhythm shows normal sinus rhythm. Resting ECG shows no ST-segment deviation. The ECG shows premature ventricular contractions.   Exercise capacity was severely impaired. Stage 1 was reached after exercising for 3 min and 0 sec. Maximum HR of 144 bpm. MPHR 92.0 %. Peak METS 4.6 . The patient experienced no angina during the test. The patient achieved the target heart rate. The patient requested the test to be stopped. Hypertensive blood pressure and normal heart rate response noted during stress. Heart rate recovery was normal.   No ST deviation was noted. Arrhythmias during stress: occasional PVCs. Arrhythmias during recovery: occasional PVCs. ECG was interpretable and conclusive. The ECG was negative for ischemia.     --------------------------------  Signed: Lula Olszewski,  MD 02/13/2023 12:05 PM

## 2023-02-13 NOTE — Assessment & Plan Note (Addendum)
Explained he would be better off using almost any nicotine product than cigarettes

## 2023-02-13 NOTE — Patient Instructions (Signed)
It was a pleasure seeing you today!  Your health and satisfaction are our top priorities.  Next Steps:  [x]  Early Intervention: Schedule sooner, call our on-call services, or go to emergency room if concerning symptoms arise (pain, changes, new concerns). [x]  Flexible Follow-Up: We recommend a Return in about 6 months (around 08/16/2023). for optimal care. This allows for ideal progress monitoring and treatment adjustments. If the recommended follow-up interval is challenging, consider telemedicine convenience follow up or asking for extended prescriptions (criteria apply). [x]  Preventive Care: Schedule your annual preventive care visit! It's typically covered by insurance and helps identify potential health issues early. [x]  Lab & X-ray Appointments: Incomplete tests scheduled today, or call to schedule. X-rays: [facility] (M-F, 8:30am-noon or 1pm-5pm). [x]  Medical Information Release: Sign a release form at front desk to obtain relevant medical information we don't have.  Bring to Your Next Appointment:  Medications: All medication bottles for an accurate prescription record. Health Diaries: If monitoring health conditions, keep a diary for discussions at your next appointment.  Review your draft clinical notes below and the final encounter summary tomorrow on MyChart. Robert Yates was seen today for 6 month follow-up and insomnia.  Insomnia, unspecified type Overview: - he doing much better with seroquel 50-100 nightly, failed many other meds before "only thing that ever worked" Subjective:     Robert Yates is a 62 y.o. male who complains of insomnia. Onset wasyears ago. Patient describes symptoms as early morning awakening and frequent night time awakening. Patient has found complete relief with seroquel but not any conservative measures. Associated symptoms include: none. Patient denies anxiety, depression, fatigue, frequent nighttime urination, irritability, leg cramps,  and restless legs. Symptoms have been well-controlled.  Assessment & Plan:  Assessment:  Well-controlled thanks to seroquel   Insomnia associated with snoring    Plan:  Continue seroquel after discussion  Discussed sleep hygiene measures including regular sleep schedule, optimal sleep environment, and relaxing presleep rituals. Avoid daytime naps. Avoid caffeine after noon. Avoid excess alcohol. Avoid tobacco. Recommended daily exercise. Transport planner distributed. We briefly discussed the potential benefits of behavioral therapy and sleep study but he expressed a preference to not move forward with my offer(s) to facilitate the proposed intervention(s) at this time. We will revisit this option if needed at future visits and  he is always encouraged to contact me if the situation changes or he changes his mind.   Through a comprehensive analysis of anonymized patient data, we informed an AI-generated report. All AI suggestions underwent careful evaluation against established guidelines to ensure optimal patient care. During our collaborative review of the report's findings, we discussed potential diagnoses, recommended tests, treatment options, and next steps. I explained the rationale behind the AI recommendations, integrating my clinical judgment for the patient's understanding.  In collaboration with the patient, we explored the potential benefits and risks associated with certain categories of AI-suggested interventions (switching seroquel to melatonin and using behavioral health counselor for cbt treatment(s) for insomnia). For example, we discussed the advantages and considerations of more aggressive treatment options. Ultimately, the patient elected to closely monitor the situation based on their preferences and our shared understanding of their condition. Patient's best interests and evidence-based practices guided all decisions, respecting their autonomy. Moving forward, we will  continue close monitoring and revisit these options at the next visit or sooner if their condition changes. The patient was encouraged to keep me informed of any developments."  AI-Generated Report (red):   The Question  What are the important long term side effects of Seroquel that make it a poor choice for long term management of insomnia and what should be used instead in geriatric patients.  Design Strategy  To systematically address this question, we will first identify the long-term side effects of Seroquel (quetiapine) that are particularly concerning for the management of insomnia, especially in geriatric patients. Following this, we will explore alternative treatment options that are considered safer and more appropriate for long-term management of insomnia in this population. Our analysis will be grounded in evidence-based medicine and clinical guidelines to ensure accuracy and relevance.  Execute Strategy  Quetiapine (Seroquel) is an atypical antipsychotic medication that is sometimes used off-label for the management of insomnia. However, its long-term use, especially in geriatric patients, is associated with several concerning side effects:  1. Metabolic Syndrome: Quetiapine can induce weight gain, hyperglycemia, and dyslipidemia, contributing to the development of metabolic syndrome, which is particularly problematic in the elderly due to their increased baseline risk for these conditions.  2. Cardiovascular Effects: It can cause orthostatic hypotension and, in rare cases, prolongation of the QT interval, leading to an increased risk of falls and cardiac arrhythmias, respectively.  3. Neurological Effects: There is a risk of extrapyramidal symptoms and, more importantly, an increased risk of tardive dyskinesia with long-term use. Additionally, quetiapine can lead to sedation and cognitive impairment, exacerbating geriatric concerns such as dementia and delirium.  4. Increased  Mortality in Elderly Patients with Dementia-Related Psychosis: The FDA has issued a black box warning regarding the increased risk of death associated with the use of antipsychotics in elderly patients with dementia-related psychosis.  Given these risks, alternative treatments for insomnia in geriatric patients should be considered. Non-pharmacological interventions, particularly cognitive-behavioral therapy for insomnia (CBT-I), are recommended as the first-line treatment due to their efficacy and absence of adverse effects. If medication is necessary, short-term use of non-benzodiazepine hypnotics (e.g., zolpidem, eszopiclone) may be considered, keeping in mind their potential for dependence and adverse effects. Melatonin and melatonin receptor agonists (e.g., ramelteon) can also be considered due to their favorable safety profile in the elderly.  Systematically Ensure Accuracy & Precision  Upon re-evaluation, the recommendation against the long-term use of quetiapine for insomnia in geriatric patients is supported by its profile of potential adverse effects, which can significantly impact the health and quality of life in this population. The emphasis on non-pharmacological interventions aligns with clinical guidelines advocating for safer and more sustainable approaches to managing insomnia. When pharmacotherapy is indicated, the selection of agents with a lower risk profile for the elderly is crucial.  Final Answer  The long-term use of Seroquel (quetiapine) for the management of insomnia in geriatric patients is not recommended due to its association with metabolic syndrome, cardiovascular effects, neurological effects, and an increased risk of mortality in elderly patients with dementia-related psychosis. Instead, cognitive-behavioral therapy for insomnia (CBT-I) should be prioritized as the first-line treatment. If medication is necessary, options with a safer profile for the elderly, such as  non-benzodiazepine hypnotics or melatonin receptor agonists, should be considered.   Sleep disturbance -     QUEtiapine Fumarate; TAKE 1/2 TO 1 TABLET(50 TO 100 MG) BY MOUTH AT BEDTIME  Dispense: 30 tablet; Refill: 2 -     TSH; Future  Palpitations -     Metoprolol Tartrate; Take 0.5 tablets (12.5 mg total) by mouth 2 (two) times daily.  Dispense: 180 tablet; Refill: 3  Coronary artery disease due to calcified coronary lesion Overview: On LDCT 10/2022  left anterior descending coronary artery disease.  taking with metoprolol Taking aspirin 81 taking rosuvastatin 40 Showed CT images and reinterpreted for the patient - the calcifications look extensive throughout proximal coronary circulation, discussed coronary artery disease and smoking and diet- Held off on ozempic Evaluated by Dr. Antoine Poche cardiology f    Orders: -     CBC; Future -     Differential; Future -     Comprehensive metabolic panel; Future  Cervical spondylosis Overview: Interim history: "no trouble with it" lately   Cigarette smoker Overview: He has been cutting back he is down to 1 pack a day but has smoked as much is 2-1/2 packs in a day in the past on a stressful day but probably never more than 2 a day on average.  Started smoking at age 15.  Reports he is out of date on yearly CAT scan screening Has tried patches and gum Knows people with bad reaction to Chantix.  Assessment & Plan: Explained he would be better off using almost any nicotine product than cigarettes   Elevated LFTs Overview: Lab Results  Component Value Date/Time   ALT 27 07/07/2022 02:46 PM   ALT 26 01/03/2022 10:51 AM   ALT 23 11/25/2021 01:10 PM   ALT 262 (H) 06/28/2021 01:41 PM   ALT 480 (H) 06/25/2021 06:09 AM   Lab Results  Component Value Date/Time   AST 21 07/07/2022 02:46 PM   AST 18 01/03/2022 10:51 AM   AST 16 11/25/2021 01:10 PM   AST 87 (H) 06/28/2021 01:41 PM   AST 267 (H) 06/25/2021 06:09 AM   Lab Results   Component Value Date/Time   ALKPHOS 66 07/07/2022 02:46 PM   ALKPHOS 69 01/03/2022 10:51 AM   ALKPHOS 154 (H) 06/25/2021 06:09 AM   ALKPHOS 96 06/24/2021 04:21 AM   ALKPHOS 97 06/23/2021 01:55 PM   No components found for: "BILIT" No results found for: "LABGGT"     History of colon polyps Overview: Planned for 2024 colonoscopy Gastroenterologist:  Lanelle Bal, DO    Breast tumor Overview: 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 that started out about the size of a pea maybe 6 to 7 years ago but now is grown to about the size of a ping pong ball  Orders: -     MM Digital Diagnostic Bilat; Future -     [place] LIMITED ULTRASOUND INCLUDING AXILLA LEFT BREAST ; Future -     Ambulatory referral to General Surgery; Future -     Lipid panel; Future  Hyperlipidemia, unspecified hyperlipidemia type Overview: Statin myopathy history: no,  Current Regimen: rosuvastatin 40 Diet and exercise   Lab Results  Component Value Date   CHOL 214 (H) 07/07/2022   CHOL 202 (H) 01/03/2022   CHOL 211 (H) 12/16/2019   Lab Results  Component Value Date   HDL 36.50 (L) 07/07/2022   HDL 39.30 01/03/2022   HDL 46 12/16/2019   Lab Results  Component Value Date   LDLCALC 123 (H) 01/03/2022   LDLCALC 135 (H) 12/16/2019   Lab Results  Component Value Date   TRIG 340.0 (H) 07/07/2022   TRIG 199.0 (H) 01/03/2022   TRIG 163 (H) 12/16/2019   Lab Results  Component Value Date   CHOLHDL 6 07/07/2022   CHOLHDL 5 01/03/2022   CHOLHDL 4.6 12/16/2019   Lab Results  Component Value Date   LDLDIRECT 148.0 07/07/2022    The 10-year ASCVD risk score (Arnett DK, et  al., 2019) is: 14.1%   Values used to calculate the score:     Age: 20 years     Sex: Male     Is Non-Hispanic African American: No     Diabetic: No     Tobacco smoker: Yes     Systolic Blood Pressure: 100 mmHg     Is BP treated: No     HDL Cholesterol: 36.5 mg/dL     Total Cholesterol: 214 mg/dL       Getting  Answers and Following Up:  Simple Questions & Concerns: Contact us at [phone] or MyChart messaging. Complex Concerns: Schedule an appointment. Lab & Imaging Results: We'll contact you directly for abnormal results or if you don't use MyChart. Normal results on MyChart within 2-3 business days, with a review message from Dr. Jon Billings. Need results sooner? Contact us. Referrals: Our coordinator manages referrals; the specialist's office will contact you within 2 weeks. Call us if you haven't heard from them after 2 weeks. Staying Connected: Activate MyChart for the fastest, easiest way to access results and message [place]. See instructions on the last page of this paperwork. Billing: X-ray & Lab orders billed by separate companies. Visit charges: discuss with administrative services. Feedback: Share any complaints or compliments with Dr. Jon Billings or Practice Administrators Edwena Felty or Hasna Boutaib). Scheduling Tips: Shortest wait times are 8 am and 1 pm appointments). Longer appointments available, see front desk [employer] may require multiple appointments). We're committed to supporting your health journey. Questions? Contact our office. Remember, you're key to achieving the best outcomes.

## 2023-02-13 NOTE — Assessment & Plan Note (Signed)
Encouraged continuing with aspirin and rosuvastatin Well-controlled, patient expresses that there is not any chest pain or symptom(s) Encouraged continuing with follow up Cardiologist Rollene Rotunda, MD

## 2023-02-16 ENCOUNTER — Encounter: Payer: Self-pay | Admitting: Internal Medicine

## 2023-02-16 ENCOUNTER — Other Ambulatory Visit (INDEPENDENT_AMBULATORY_CARE_PROVIDER_SITE_OTHER): Payer: Commercial Managed Care - HMO

## 2023-02-16 DIAGNOSIS — D493 Neoplasm of unspecified behavior of breast: Secondary | ICD-10-CM

## 2023-02-16 DIAGNOSIS — I2584 Coronary atherosclerosis due to calcified coronary lesion: Secondary | ICD-10-CM | POA: Diagnosis not present

## 2023-02-16 DIAGNOSIS — I2510 Atherosclerotic heart disease of native coronary artery without angina pectoris: Secondary | ICD-10-CM

## 2023-02-16 DIAGNOSIS — R739 Hyperglycemia, unspecified: Secondary | ICD-10-CM | POA: Insufficient documentation

## 2023-02-16 DIAGNOSIS — D751 Secondary polycythemia: Secondary | ICD-10-CM | POA: Insufficient documentation

## 2023-02-16 DIAGNOSIS — G479 Sleep disorder, unspecified: Secondary | ICD-10-CM

## 2023-02-16 DIAGNOSIS — E786 Lipoprotein deficiency: Secondary | ICD-10-CM | POA: Insufficient documentation

## 2023-02-16 HISTORY — DX: Secondary polycythemia: D75.1

## 2023-02-16 LAB — COMPREHENSIVE METABOLIC PANEL
ALT: 38 U/L (ref 0–53)
AST: 25 U/L (ref 0–37)
Albumin: 4.3 g/dL (ref 3.5–5.2)
Alkaline Phosphatase: 65 U/L (ref 39–117)
BUN: 9 mg/dL (ref 6–23)
CO2: 27 mEq/L (ref 19–32)
Calcium: 9.7 mg/dL (ref 8.4–10.5)
Chloride: 104 mEq/L (ref 96–112)
Creatinine, Ser: 0.97 mg/dL (ref 0.40–1.50)
GFR: 82.63 mL/min (ref >60.00)
Glucose, Bld: 134 mg/dL — ABNORMAL HIGH (ref 70–99)
Potassium: 3.8 mEq/L (ref 3.5–5.1)
Sodium: 140 mEq/L (ref 135–145)
Total Bilirubin: 0.6 mg/dL (ref 0.2–1.2)
Total Protein: 6.6 g/dL (ref 6.0–8.3)

## 2023-02-16 LAB — CBC WITH DIFFERENTIAL/PLATELET
Basophils Absolute: 0 10*3/uL (ref 0.0–0.1)
Basophils Relative: 0.9 % (ref 0.0–3.0)
Eosinophils Absolute: 0.1 10*3/uL (ref 0.0–0.7)
Eosinophils Relative: 2.1 % (ref 0.0–5.0)
HCT: 49.9 % (ref 39.0–52.0)
Hemoglobin: 16.6 g/dL (ref 13.0–17.0)
Lymphocytes Relative: 28.8 % (ref 12.0–46.0)
Lymphs Abs: 1.5 10*3/uL (ref 0.7–4.0)
MCHC: 33.4 g/dL (ref 30.0–36.0)
MCV: 89.6 fl (ref 78.0–100.0)
Monocytes Absolute: 0.5 10*3/uL (ref 0.1–1.0)
Monocytes Relative: 9.9 % (ref 3.0–12.0)
Neutro Abs: 3 10*3/uL (ref 1.4–7.7)
Neutrophils Relative %: 58.3 % (ref 43.0–77.0)
Platelets: 243 10*3/uL (ref 150.0–400.0)
RBC: 5.56 Mil/uL (ref 4.22–5.81)
RDW: 15 % (ref 11.5–15.5)
WBC: 5.1 10*3/uL (ref 4.0–10.5)

## 2023-02-16 LAB — LIPID PANEL
Cholesterol: 106 mg/dL (ref 0–200)
HDL: 38.5 mg/dL — ABNORMAL LOW (ref >39.00)
LDL Cholesterol: 44 mg/dL (ref 0–99)
NonHDL: 67.76
Total CHOL/HDL Ratio: 3
Triglycerides: 120 mg/dL (ref 0.0–149.0)
VLDL: 24 mg/dL (ref 0.0–40.0)

## 2023-02-16 LAB — TSH: TSH: 1.15 u[IU]/mL (ref 0.35–5.50)

## 2023-02-16 NOTE — Addendum Note (Signed)
Addended by: Laddie Aquas A on: 02/16/2023 09:18 AM   Modules accepted: Orders

## 2023-02-16 NOTE — Addendum Note (Signed)
Addended by: Nash Shearer D on: 02/16/2023 09:11 AM   Modules accepted: Orders

## 2023-02-28 ENCOUNTER — Ambulatory Visit (HOSPITAL_COMMUNITY): Payer: Medicaid Other | Admitting: Anesthesiology

## 2023-02-28 ENCOUNTER — Encounter (HOSPITAL_COMMUNITY)
Admission: RE | Disposition: A | Discharge status: Home / Self Care | Payer: Self-pay | Source: Home / Self Care | Attending: Internal Medicine

## 2023-02-28 ENCOUNTER — Other Ambulatory Visit: Payer: Self-pay

## 2023-02-28 ENCOUNTER — Encounter (HOSPITAL_COMMUNITY): Payer: Self-pay

## 2023-02-28 ENCOUNTER — Ambulatory Visit (HOSPITAL_BASED_OUTPATIENT_CLINIC_OR_DEPARTMENT_OTHER): Payer: Medicaid Other | Admitting: Anesthesiology

## 2023-02-28 ENCOUNTER — Ambulatory Visit (HOSPITAL_COMMUNITY)
Admission: RE | Admit: 2023-02-28 | Discharge: 2023-02-28 | Disposition: A | Discharge status: Home / Self Care | Payer: Medicaid Other | Attending: Internal Medicine | Admitting: Internal Medicine

## 2023-02-28 DIAGNOSIS — Z86010 Personal history of colonic polyps: Secondary | ICD-10-CM | POA: Diagnosis not present

## 2023-02-28 DIAGNOSIS — M1990 Unspecified osteoarthritis, unspecified site: Secondary | ICD-10-CM | POA: Diagnosis not present

## 2023-02-28 DIAGNOSIS — K5730 Diverticulosis of large intestine without perforation or abscess without bleeding: Secondary | ICD-10-CM

## 2023-02-28 DIAGNOSIS — K635 Polyp of colon: Secondary | ICD-10-CM

## 2023-02-28 DIAGNOSIS — J449 Chronic obstructive pulmonary disease, unspecified: Secondary | ICD-10-CM

## 2023-02-28 DIAGNOSIS — I2510 Atherosclerotic heart disease of native coronary artery without angina pectoris: Secondary | ICD-10-CM | POA: Diagnosis not present

## 2023-02-28 DIAGNOSIS — J4489 Other specified chronic obstructive pulmonary disease: Secondary | ICD-10-CM | POA: Insufficient documentation

## 2023-02-28 DIAGNOSIS — D751 Secondary polycythemia: Secondary | ICD-10-CM | POA: Insufficient documentation

## 2023-02-28 DIAGNOSIS — D123 Benign neoplasm of transverse colon: Secondary | ICD-10-CM | POA: Diagnosis not present

## 2023-02-28 DIAGNOSIS — D122 Benign neoplasm of ascending colon: Secondary | ICD-10-CM | POA: Diagnosis not present

## 2023-02-28 DIAGNOSIS — Z09 Encounter for follow-up examination after completed treatment for conditions other than malignant neoplasm: Secondary | ICD-10-CM | POA: Diagnosis present

## 2023-02-28 DIAGNOSIS — F17210 Nicotine dependence, cigarettes, uncomplicated: Secondary | ICD-10-CM

## 2023-02-28 DIAGNOSIS — D126 Benign neoplasm of colon, unspecified: Secondary | ICD-10-CM

## 2023-02-28 DIAGNOSIS — K648 Other hemorrhoids: Secondary | ICD-10-CM

## 2023-02-28 DIAGNOSIS — K219 Gastro-esophageal reflux disease without esophagitis: Secondary | ICD-10-CM | POA: Insufficient documentation

## 2023-02-28 DIAGNOSIS — Z1211 Encounter for screening for malignant neoplasm of colon: Secondary | ICD-10-CM | POA: Diagnosis not present

## 2023-02-28 HISTORY — PX: POLYPECTOMY: SHX5525

## 2023-02-28 HISTORY — PX: COLONOSCOPY WITH PROPOFOL: SHX5780

## 2023-02-28 LAB — GLUCOSE, CAPILLARY: Glucose-Capillary: 103 mg/dL — ABNORMAL HIGH (ref 70–99)

## 2023-02-28 SURGERY — COLONOSCOPY WITH PROPOFOL
Anesthesia: General

## 2023-02-28 MED ORDER — LACTATED RINGERS IV SOLN: INTRAVENOUS | Status: DC

## 2023-02-28 MED ORDER — PROPOFOL 10 MG/ML IV BOLUS
INTRAVENOUS | Status: DC | PRN
  Administered 2023-02-28: 100 mg via INTRAVENOUS

## 2023-02-28 MED ORDER — PROPOFOL 500 MG/50ML IV EMUL
INTRAVENOUS | Status: DC | PRN
  Administered 2023-02-28: 150 ug/kg/min via INTRAVENOUS

## 2023-02-28 MED ORDER — PROPOFOL 10 MG/ML IV BOLUS
INTRAVENOUS | Status: AC
  Filled 2023-02-28: qty 20

## 2023-02-28 MED ORDER — EPHEDRINE 5 MG/ML INJ
INTRAVENOUS | Status: AC
  Filled 2023-02-28: qty 5

## 2023-02-28 MED ORDER — LIDOCAINE HCL (CARDIAC) PF 100 MG/5ML IV SOSY
PREFILLED_SYRINGE | INTRAVENOUS | Status: DC | PRN
  Administered 2023-02-28: 80 mg via INTRAVENOUS

## 2023-02-28 MED ORDER — EPHEDRINE SULFATE-NACL 50-0.9 MG/10ML-% IV SOSY
PREFILLED_SYRINGE | INTRAVENOUS | Status: DC | PRN
  Administered 2023-02-28: 10 mg via INTRAVENOUS

## 2023-02-28 MED ORDER — PHENYLEPHRINE HCL (PRESSORS) 10 MG/ML IV SOLN
INTRAVENOUS | Status: DC | PRN
  Administered 2023-02-28 (×4): 100 ug via INTRAVENOUS

## 2023-02-28 NOTE — Anesthesia Postprocedure Evaluation (Signed)
Anesthesia Post Note  Patient: [name]  Procedure(s) Performed: COLONOSCOPY WITH PROPOFOL POLYPECTOMY  Patient location during evaluation: Phase II Anesthesia Type: General Level of consciousness: awake and alert and oriented Pain management: pain level controlled Vital Signs Assessment: post-procedure vital signs reviewed and stable Respiratory status: spontaneous breathing, nonlabored ventilation and respiratory function stable Cardiovascular status: blood pressure returned to baseline and stable Postop Assessment: no apparent nausea or vomiting Anesthetic complications: no Comments: Patient was felt dizzy when PACU nursing staff was about transfer him from wheel chair to his car, brought him back to PACU area, was given oral fluids, oxygen via nasal cannula, vital signs stable. He felt better after giving oral fluids, later discharged home without any adverse event.  No notable events documented.   Last Vitals:  Vitals:   02/28/23 1435 02/28/23 1451  BP: 132/89 (!) 139/95  Pulse: 90 82  Resp: 20 14  Temp:    SpO2: 97% 100%    Last Pain:  Vitals:   02/28/23 1407  TempSrc: Oral  PainSc: 0-No pain                 Cyndy Braver C Amor Packard

## 2023-02-28 NOTE — Transfer of Care (Addendum)
Immediate Anesthesia Transfer of Care Note  Patient: Robert Yates  Procedure(s) Performed: COLONOSCOPY WITH PROPOFOL POLYPECTOMY  Patient Location: PACU  Anesthesia Type:General  Level of Consciousness: drowsy and patient cooperative  Airway & Oxygen Therapy: Patient Spontanous Breathing  Post-op Assessment: Report given to RN and Post -op Vital signs reviewed and stable  Post vital signs: Reviewed and stable  Last Vitals:  Vitals Value Taken Time  BP 110/46 02/28/23  1407  Temp 36.8 02/28/23  1407  Pulse 95 02/28/23  1407  Resp 20 03/03/23  1407  SpO2 97% 02/28/23  1407    Last Pain:  Vitals:   02/28/23 1314  TempSrc:   PainSc: 0-No pain      Patients Stated Pain Goal: 7 (02/28/23 1246)  Complications: No notable events documented.

## 2023-02-28 NOTE — OR Nursing (Signed)
Patients VSS and drinking fluilds. States he is ready to go home.

## 2023-02-28 NOTE — Discharge Instructions (Addendum)
  Colonoscopy Discharge Instructions  Read the instructions outlined below and refer to this sheet in the next few weeks. These discharge instructions provide you with general information on caring for yourself after you leave the hospital. Your doctor may also give you specific instructions. While your treatment has been planned according to the most current medical practices available, unavoidable complications occasionally occur.   ACTIVITY You may resume your regular activity, but move at a slower pace for the next 24 hours.  Take frequent rest periods for the next 24 hours.  Walking will help get rid of the air and reduce the bloated feeling in your belly (abdomen).  No driving for 24 hours (because of the medicine (anesthesia) used during the test).   Do not sign any important legal documents or operate any machinery for 24 hours (because of the anesthesia used during the test).  NUTRITION Drink plenty of fluids.  You may resume your normal diet as instructed by your doctor.  Begin with a light meal and progress to your normal diet. Heavy or fried foods are harder to digest and may make you feel sick to your stomach (nauseated).  Avoid alcoholic beverages for 24 hours or as instructed.  MEDICATIONS You may resume your normal medications unless your doctor tells you otherwise.  WHAT YOU CAN EXPECT TODAY Some feelings of bloating in the abdomen.  Passage of more gas than usual.  Spotting of blood in your stool or on the toilet paper.  IF YOU HAD POLYPS REMOVED DURING THE COLONOSCOPY: No aspirin products for 7 days or as instructed.  No alcohol for 7 days or as instructed.  Eat a soft diet for the next 24 hours.  FINDING OUT THE RESULTS OF YOUR TEST Not all test results are available during your visit. If your test results are not back during the visit, make an appointment with your caregiver to find out the results. Do not assume everything is normal if you have not heard from your  caregiver or the medical facility. It is important for you to follow up on all of your test results.  SEEK IMMEDIATE MEDICAL ATTENTION IF: You have more than a spotting of blood in your stool.  Your belly is swollen (abdominal distention).  You are nauseated or vomiting.  You have a temperature over 101.  You have abdominal pain or discomfort that is severe or gets worse throughout the day.   Your colonoscopy revealed 13 polyp(s) which I removed successfully. These were all small polyps. Await pathology results, my office will contact you. I recommend repeating colonoscopy in 1-3 years for surveillance purposes depending on pathology results. May consider genetic testing after pathology reviewed.    I hope you have a great rest of your week!  Hennie Duos. Marletta Lor, D.O. Gastroenterology and Hepatology [facility]

## 2023-02-28 NOTE — Op Note (Signed)
[facility] Patient Name: Robert Yates Procedure Date: 02/28/2023 12:58 PM MRN: [id number] Date of Birth: 08-14-1961 Attending MD: Hennie Duos. Marletta Lor , [place], [id number] CSN: [id number] Age: 62 Admit Type: Outpatient Procedure:                Colonoscopy Indications:              Surveillance: History of numerous (> 10) adenomas                            on last colonoscopy (< 3 yrs) Providers:                Hennie Duos. Marletta Lor, DO, Angelica Ran, Dyann Ruddle Referring MD:              Medicines:                See the Anesthesia note for documentation of the                            administered medications Complications:            No immediate complications. Estimated Blood Loss:     Estimated blood loss was minimal. Procedure:                Pre-Anesthesia Assessment:                           - The anesthesia plan was to use monitored                            anesthesia care (MAC).                           After obtaining informed consent, the colonoscope                            was passed under direct vision. Throughout the                            procedure, the patient's blood pressure, pulse, and                            oxygen saturations were monitored continuously. The                            PCF-HQ190L ([id number]) scope was introduced through                            the anus and advanced to the the cecum, identified                            by appendiceal orifice and ileocecal valve. The                            colonoscopy was performed without difficulty. The  patient tolerated the procedure well. The quality                            of the bowel preparation was evaluated using the                            BBPS [facility] Bowel Preparation Scale) with scores                            of: Right Colon = 2 (minor amount of residual                            staining, small fragments of stool and/or opaque                             liquid, but mucosa seen well), Transverse Colon = 2                            (minor amount of residual staining, small fragments                            of stool and/or opaque liquid, but mucosa seen                            well) and Left Colon = 2 (minor amount of residual                            staining, small fragments of stool and/or opaque                            liquid, but mucosa seen well). The total BBPS score                            equals 6. Fair. Scope In: 1:23:37 PM Scope Out: 2:02:12 PM Scope Withdrawal Time: 0 hours 32 minutes 21 seconds  Total Procedure Duration: 0 hours 38 minutes 35 seconds  Findings:      Non-bleeding internal hemorrhoids were found during endoscopy.      Multiple small-mouthed diverticula were found in the sigmoid colon and       descending colon.      Three sessile polyps were found in the ascending colon. The polyps were       3 to 4 mm in size. These polyps were removed with a cold snare.       Resection and retrieval were complete.      Three sessile polyps were found in the transverse colon. The polyps were       4 to 5 mm in size. These polyps were removed with a cold snare.       Resection and retrieval were complete.      Two sessile polyps were found in the descending colon. The polyps were 4       to 6 mm in size. These polyps were removed with a cold snare. Resection       and retrieval were complete.  Five sessile polyps were found in the sigmoid colon. The polyps were 4       to 7 mm in size. These polyps were removed with a cold snare. Resection       and retrieval were complete. Impression:               - Non-bleeding internal hemorrhoids.                           - Diverticulosis in the sigmoid colon and in the                            descending colon.                           - Three 3 to 4 mm polyps in the ascending colon,                            removed with a cold snare. Resected and  retrieved.                           - Three 4 to 5 mm polyps in the transverse colon,                            removed with a cold snare. Resected and retrieved.                           - Two 4 to 6 mm polyps in the descending colon,                            removed with a cold snare. Resected and retrieved.                           - Five 4 to 7 mm polyps in the sigmoid colon,                            removed with a cold snare. Resected and retrieved. Moderate Sedation:      Per Anesthesia Care Recommendation:           - Patient has a contact number available for                            emergencies. The signs and symptoms of potential                            delayed complications were discussed with the                            patient. Return to normal activities tomorrow.                            Written discharge instructions were provided to the  patient.                           - Resume previous diet.                           - Continue present medications.                           - Await pathology results.                           - Repeat colonoscopy date to be determined after                            pending pathology results are reviewed for                            surveillance based on pathology results.                           - Consider genetic testing Procedure Code(s):        --- Professional ---                           [phone], Colonoscopy, flexible; with removal of                            tumor(s), polyp(s), or other lesion(s) by snare                            technique Diagnosis Code(s):        --- Professional ---                           Z86.010, Personal history of colonic polyps                           K64.8, Other hemorrhoids                           D12.2, Benign neoplasm of ascending colon                           D12.3, Benign neoplasm of transverse colon (hepatic                             flexure or splenic flexure)                           D12.4, Benign neoplasm of descending colon                           D12.5, Benign neoplasm of sigmoid colon                           K57.30, Diverticulosis of large intestine without  perforation or abscess without bleeding CPT copyright 2022 American Medical Association. All rights reserved. The codes documented in this report are preliminary and upon coder review may  be revised to meet current compliance requirements. Hennie Duos. Marletta Lor, DO Hennie Duos. Marletta Lor, DO 02/28/2023 2:12:56 PM This report has been signed electronically. Number of Addenda: 0

## 2023-02-28 NOTE — Anesthesia Preprocedure Evaluation (Addendum)
Anesthesia Evaluation  Patient identified by MRN, date of birth, ID band Patient awake    Reviewed: Allergy & Precautions, H&P , NPO status , Patient's Chart, lab work & pertinent test results  Airway Mallampati: II  TM Distance: >3 FB Neck ROM: Full    Dental  (+) Upper Dentures, Lower Dentures   Pulmonary asthma , COPD, Current Smoker   Pulmonary exam normal breath sounds clear to auscultation       Cardiovascular + CAD  + dysrhythmias (PVCs) + Valvular Problems/Murmurs  Rhythm:Irregular Rate:Normal + Systolic murmurs PVCs    Neuro/Psych negative neurological ROS  negative psych ROS   GI/Hepatic Neg liver ROS,GERD  Medicated,,  Endo/Other  negative endocrine ROS    Renal/GU negative Renal ROS  negative genitourinary   Musculoskeletal  (+) Arthritis , Osteoarthritis,    Abdominal   Peds negative pediatric ROS (+)  Hematology  (+) Blood dyscrasia (mild polycythemia)   Anesthesia Other Findings   Reproductive/Obstetrics negative OB ROS                             Anesthesia Physical Anesthesia Plan  ASA: 3  Anesthesia Plan: General   Post-op Pain Management: Minimal or no pain anticipated   Induction: Intravenous  PONV Risk Score and Plan: 1 and Propofol infusion  Airway Management Planned: Nasal Cannula and Natural Airway  Additional Equipment:   Intra-op Plan:   Post-operative Plan:   Informed Consent: I have reviewed the patients History and Physical, chart, labs and discussed the procedure including the risks, benefits and alternatives for the proposed anesthesia with the patient or authorized representative who has indicated his/her understanding and acceptance.     Dental advisory given  Plan Discussed with: CRNA and Surgeon  Anesthesia Plan Comments:         Anesthesia Quick Evaluation

## 2023-02-28 NOTE — OR Nursing (Signed)
When taking patient to the car he became clammy and was dizzy when stood to get into the car.  Patient assisted back into the wheelchair and bought back into the post op room for vital signs and assessment.  Dr. Alva Garnet in to assess patient.

## 2023-02-28 NOTE — H&P (Signed)
Primary Care Physician:  Lula Olszewski, MD Primary Gastroenterologist:  Dr. Marletta Lor  Pre-Procedure History & Physical: HPI:  Robert Yates is a 62 y.o. male is here for a colonoscopy to be performed for surveillance purposes, personal history of >10 adenomatous/sessile serrated colon polyps 1 year ago Past Medical History:  Diagnosis Date   Allergy 1970   Anxiety 2021   Aortic atherosclerosis (HCC) 10/20/2022   Noted on 10/2022 LDCT    Arm pain, lateral, right 11/01/2022   I think this is musculoskeletal in the right upper humerus but will get XR and refer to sports medicine.   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 just above the cystic duct   Cataract 2000   COPD  GOLD at least 2  / active smoking  12/16/2019   COPD (chronic obstructive pulmonary disease) (HCC)    Elevated LFTs    Lab Results  Component  Value  Date/Time     ALT  27  07/07/2022 02:46 PM     ALT  26  01/03/2022 10:51 AM     ALT  23  11/25/2021 01:10 PM     ALT  262 (H)  06/28/2021 01:41 PM     ALT  480 (H)  06/25/2021 06:09 AM         Lab Results  Component  Value  Date/Time     AST  21  07/07/2022 02:46 PM     AST  18  01/03/2022 10:51 AM     AST  16  11/25/2021 01:10 PM     AST  87 (H)  06/28/2021 01:41 PM   GERD (gastroesophageal reflux disease) 2020   Heart murmur 1970   Hyperlipidemia 07/28/2022   The 10-year ASCVD risk score (Arnett DK, et al., 2019) is: 14.9%   Values used to calculate the score:     Age: 55 years     Sex: Male     Is Non-Hispanic African American: No     Diabetic: No     Tobacco smoker: Yes     Systolic Blood Pressure: 106 mmHg     Is BP treated: No     HDL Cholesterol: 36.5 mg/dL     Total Cholesterol: 214 mg/dL   Started atorvastatin 07/2022   Insomnia 07/28/2022   - he doing much better with seroquel 50-100 nightly, failed many other meds before "only thing that ever worked"   Palpitations    Polycythemia  02/16/2023   Mild Likely secondary to smoking Lab Results Component Value Date/Time  HGB 16.6 02/16/2023 09:19 AM  HGB 17.6 (H) 01/03/2022 10:51 AM  HGB 14.8 06/24/2021 10:35 PM  HGB 15.4 06/24/2021 04:21 AM  HGB 18.1 (H) 06/23/2021 01:55 PM     Prediabetes 07/28/2022   Lab Results Component Value Date  HGBA1C 6.1 07/07/2022  HGBA1C 6.0 01/03/2022  HGBA1C 5.7 (H) 12/16/2019     Pulmonary hypertension (HCC) 10/20/2022   On CT lung cancer screening 4. Dilatation of the pulmonic trunk and left main pulmonary artery, which may suggest pulmonary arterial hypertension. Further clinical evaluation is recommended.    Past Surgical History:  Procedure Laterality Date   CHOLECYSTECTOMY N/A 07/14/2021   Procedure: LAPAROSCOPIC CHOLECYSTECTOMY;  Surgeon: Fritzi Mandes, MD;  Location: [facility] OR;  Service: General;  Laterality: N/A;   COLONOSCOPY  1994   Dr. Russella Dar; hyperplastic polyps   COLONOSCOPY WITH PROPOFOL N/A 12/13/2021  Procedure: COLONOSCOPY WITH PROPOFOL;  Surgeon: Lanelle Bal, DO;  Location: [facility];  Service: Endoscopy;  Laterality: N/A;  11:00am   POLYPECTOMY  12/13/2021   Procedure: POLYPECTOMY;  Surgeon: Lanelle Bal, DO;  Location: [facility];  Service: Endoscopy;;   skin grafts     TONSILLECTOMY      Prior to Admission medications   Medication Sig Start Date End Date Taking? Authorizing Provider  aspirin EC 81 MG tablet Take 1 tablet (81 mg total) by mouth daily. Swallow whole. 11/01/22  Yes Lula Olszewski, MD  fluticasone furoate-vilanterol (BREO ELLIPTA) 100-25 MCG/ACT AEPB Inhale 1 puff into the lungs daily. 07/14/22  Yes Nyoka Cowden, MD  metoprolol tartrate (LOPRESSOR) 25 MG tablet Take 0.5 tablets (12.5 mg total) by mouth 2 (two) times daily. 02/13/23  Yes Lula Olszewski, MD  QUEtiapine (SEROQUEL) 100 MG tablet TAKE 1/2 TO 1 TABLET(50 TO 100 MG) BY MOUTH AT BEDTIME Patient taking differently: Take 50 mg by mouth at bedtime. TAKE 1/2 TO 1  TABLET(50 TO 100 MG) BY MOUTH AT BEDTIME 02/13/23  Yes Lula Olszewski, MD  rosuvastatin (CRESTOR) 40 MG tablet Take 1 tablet (40 mg total) by mouth daily. Replaces 20 mg dose, for coronary artery disease Patient taking differently: Take 40 mg by mouth at bedtime. 11/01/22  Yes Lula Olszewski, MD  Sod Picosulfate-Mag Ox-Cit Acd [facility]) 10-3.5-12 MG-GM -GM/175ML SOLN Take 1 kit by mouth as directed. 02/02/23  Yes Gilles Trimpe K, DO  tiZANidine (ZANAFLEX) 2 MG tablet Take 1-2 tablets (2-4 mg total) by mouth every 8 (eight) hours as needed. 12/28/22  Yes Rodolph Bong, MD    Allergies as of 02/02/2023 - Review Complete 12/28/2022  Allergen Reaction Noted   Lipitor [atorvastatin] Other (See Comments) 08/02/2022   Sulfa antibiotics Rash 11/08/2019    Family History  Adopted: Yes  Problem Relation Age of Onset   Colon cancer Brother        diagnosed in early 17s    Social History   Socioeconomic History   Marital status: Divorced    Spouse name: has a girlfriend   Number of children: [name]   Years of education: [name]   Highest education level: 12th grade  Occupational History   Occupation: farmer  Tobacco Use   Smoking status: Every Day    Packs/day: 1.50    Years: 47.00    Additional pack years: 0.00    Total pack years: 70.50    Types: Cigarettes    Start date: 11/07/1974   Smokeless tobacco: Never   Tobacco comments:    have been smoking for several yrs nothing seems to help me stop  Vaping Use   Vaping Use: Never used  Substance and Sexual Activity   Alcohol use: Yes    Comment: very seldom use of alcohol   Drug use: Never    Comment: Nothing in 30 years   Sexual activity: Yes  Other Topics Concern   [name]  Social History Narrative   Divorced for 20 years,married for 13 years previously.Lives with girlfriend of 2 years.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      Retired 2022   Right handed   Caffeine: 1 cup/day of decaf    Social Determinants  of Health   Financial Resource Strain: Low Risk  (02/09/2023)   Overall Financial Resource Strain (CARDIA)    Difficulty of Paying Living Expenses: Not very hard  Food Insecurity: No  Food Insecurity (02/09/2023)   Hunger Vital Sign    Worried About Running Out of Food in the Last Year: Never true    Ran Out of Food in the Last Year: Never true  Transportation Needs: No Transportation Needs (02/09/2023)   PRAPARE - Administrator, Civil Service (Medical): No    Lack of Transportation (Non-Medical): No  Physical Activity: Unknown (02/09/2023)   Exercise Vital Sign    Days of Exercise per Week: Patient declined    Minutes of Exercise per Session: [name]  Stress: No Stress Concern Present (02/09/2023)   [employer] of Occupational Health - Occupational Stress Questionnaire    Feeling of Stress : Only a little  Social Connections: Unknown (02/09/2023)   Social Connection and Isolation Panel [NHANES]    Frequency of Communication with Friends and Family: Patient declined    Frequency of Social Gatherings with Friends and Family: Patient declined    Attends Religious Services: Patient declined    Database administrator or Organizations: No    Attends Engineer, structural: [name]    Marital Status: Divorced  Catering manager Violence: [name]    Review of Systems: See HPI, otherwise negative ROS  Physical Exam: Vital signs in last 24 hours: Temp:  [98.1 F (36.7 C)] 98.1 F (36.7 C) (05/21 1246) Pulse Rate:  [88] 88 (05/21 1246) Resp:  [14] 14 (05/21 1246) BP: (144)/(85) 144/85 (05/21 1246) SpO2:  [98 %] 98 % (05/21 1246)   General:   Alert,  Well-developed, well-nourished, pleasant and cooperative in NAD Head:  Normocephalic and atraumatic. Eyes:  Sclera clear, no icterus.   Conjunctiva pink. Ears:  Normal auditory acuity. Nose:  No deformity, discharge,  or lesions. Msk:  Symmetrical without gross deformities. Normal posture. Extremities:   Without clubbing or edema. Neurologic:  Alert and  oriented x4;  grossly normal neurologically. Skin:  Intact without significant lesions or rashes. Psych:  Alert and cooperative. Normal mood and affect.  Impression/Plan: [name] is here for a colonoscopy to be performed for surveillance purposes, personal history of >10 adenomatous/sessile serrated colon polyps 1 year ago  The risks of the procedure including infection, bleed, or perforation as well as benefits, limitations, alternatives and imponderables have been reviewed with the patient. Questions have been answered. All parties agreeable.

## 2023-03-01 LAB — SURGICAL PATHOLOGY

## 2023-03-02 ENCOUNTER — Ambulatory Visit
Admission: RE | Admit: 2023-03-02 | Discharge: 2023-03-02 | Disposition: A | Discharge status: Home / Self Care | Payer: Medicaid Other | Source: Ambulatory Visit | Attending: Internal Medicine | Admitting: Internal Medicine

## 2023-03-02 ENCOUNTER — Ambulatory Visit
Admission: RE | Admit: 2023-03-02 | Discharge: 2023-03-02 | Disposition: A | Discharge status: Home / Self Care | Payer: Commercial Managed Care - HMO | Source: Ambulatory Visit | Attending: Internal Medicine | Admitting: Internal Medicine

## 2023-03-02 DIAGNOSIS — D493 Neoplasm of unspecified behavior of breast: Secondary | ICD-10-CM

## 2023-03-02 DIAGNOSIS — N6002 Solitary cyst of left breast: Secondary | ICD-10-CM | POA: Diagnosis not present

## 2023-03-02 NOTE — Progress Notes (Signed)
He may want [place] to refer to breast surgeon.  Please place any orders needed / requested. Arrange/confirm follow up appt(s).

## 2023-03-03 ENCOUNTER — Encounter (HOSPITAL_COMMUNITY): Payer: Self-pay | Admitting: Internal Medicine

## 2023-03-08 ENCOUNTER — Encounter: Payer: Self-pay | Admitting: *Deleted

## 2023-03-08 NOTE — Telephone Encounter (Signed)
Dena, can you please address with Dr. Marletta Lor? May be best to convert this to a telephone note to him.

## 2023-03-09 ENCOUNTER — Telehealth: Payer: Self-pay

## 2023-03-09 ENCOUNTER — Other Ambulatory Visit: Payer: Self-pay | Admitting: Internal Medicine

## 2023-03-09 DIAGNOSIS — D126 Benign neoplasm of colon, unspecified: Secondary | ICD-10-CM

## 2023-03-09 DIAGNOSIS — D123 Benign neoplasm of transverse colon: Secondary | ICD-10-CM

## 2023-03-09 NOTE — Telephone Encounter (Signed)
Noted See telephone note   

## 2023-03-09 NOTE — Telephone Encounter (Signed)
Pt called wanting explanation of his results, but I advised the pt when I receive your instructions I will contact him

## 2023-03-09 NOTE — Telephone Encounter (Signed)
noted 

## 2023-03-09 NOTE — Telephone Encounter (Signed)
Please let patient know polyps removed from his colon were a mix of sessile serrated polyps and benign hyperplastic polyps.  Repeat colonoscopy 3 years.  Given his numerous polyps over the last 2 years, I will order genetic testing at [facility] to be completed to check for underlying polyposis syndromes.  This result may take a few weeks.  We will call when we have it back.  Thank you

## 2023-03-09 NOTE — Telephone Encounter (Signed)
Darl Pikes please NIC for 3 year repeat colonoscopy   Phoned the pt and advised the pt of his results and the extra testing being performed. Also that it would be a few weeks. Pt expressed understanding

## 2023-03-11 DIAGNOSIS — Z419 Encounter for procedure for purposes other than remedying health state, unspecified: Secondary | ICD-10-CM | POA: Diagnosis not present

## 2023-03-13 NOTE — Telephone Encounter (Signed)
On recall  °

## 2023-03-21 ENCOUNTER — Telehealth: Payer: Self-pay

## 2023-03-21 ENCOUNTER — Other Ambulatory Visit (HOSPITAL_COMMUNITY): Payer: Self-pay

## 2023-03-21 NOTE — Telephone Encounter (Signed)
*  Pulm  PA request received via CMM for Breo Ellipta 100-25MCG/ACT aerosol powder  Per test claims [facility] Medicaid is not the Primary coverage. Patient must call plan to correct if needed.   Key: Alysia Penna

## 2023-03-23 ENCOUNTER — Other Ambulatory Visit (HOSPITAL_COMMUNITY): Payer: Self-pay

## 2023-03-31 NOTE — Telephone Encounter (Signed)
No form in Robert Yates's basket or at the front   I called [facility] Medicaid and could not get through to anyone after holding several minutes   Will try back later

## 2023-04-03 ENCOUNTER — Other Ambulatory Visit (HOSPITAL_COMMUNITY): Payer: Self-pay

## 2023-04-05 ENCOUNTER — Other Ambulatory Visit (HOSPITAL_COMMUNITY): Payer: Self-pay

## 2023-04-05 ENCOUNTER — Encounter: Payer: Self-pay | Admitting: Internal Medicine

## 2023-04-06 ENCOUNTER — Ambulatory Visit
Admission: RE | Admit: 2023-04-06 | Discharge: 2023-04-06 | Disposition: A | Discharge status: Home / Self Care | Payer: Medicaid Other | Source: Ambulatory Visit | Attending: Acute Care | Admitting: Acute Care

## 2023-04-06 DIAGNOSIS — Z87891 Personal history of nicotine dependence: Secondary | ICD-10-CM

## 2023-04-06 DIAGNOSIS — I2510 Atherosclerotic heart disease of native coronary artery without angina pectoris: Secondary | ICD-10-CM | POA: Diagnosis not present

## 2023-04-06 DIAGNOSIS — I700 Atherosclerosis of aorta: Secondary | ICD-10-CM | POA: Diagnosis not present

## 2023-04-06 DIAGNOSIS — R918 Other nonspecific abnormal finding of lung field: Secondary | ICD-10-CM | POA: Diagnosis not present

## 2023-04-06 DIAGNOSIS — R911 Solitary pulmonary nodule: Secondary | ICD-10-CM

## 2023-04-06 MED ORDER — FLUTICASONE FUROATE-VILANTEROL 100-25 MCG/ACT IN AEPB: 1.0000 | INHALATION_SPRAY | Freq: Every day | RESPIRATORY_TRACT | 11 refills | Status: DC

## 2023-04-06 NOTE — Addendum Note (Signed)
Addended by: Christen Butter on: 04/06/2023 08:10 AM   Modules accepted: Orders

## 2023-04-10 ENCOUNTER — Other Ambulatory Visit: Payer: Self-pay | Admitting: Acute Care

## 2023-04-10 DIAGNOSIS — F17210 Nicotine dependence, cigarettes, uncomplicated: Secondary | ICD-10-CM

## 2023-04-10 DIAGNOSIS — Z87891 Personal history of nicotine dependence: Secondary | ICD-10-CM

## 2023-04-10 DIAGNOSIS — Z122 Encounter for screening for malignant neoplasm of respiratory organs: Secondary | ICD-10-CM

## 2023-04-10 DIAGNOSIS — Z419 Encounter for procedure for purposes other than remedying health state, unspecified: Secondary | ICD-10-CM | POA: Diagnosis not present

## 2023-04-17 ENCOUNTER — Encounter: Payer: Self-pay | Admitting: Internal Medicine

## 2023-04-19 ENCOUNTER — Other Ambulatory Visit (HOSPITAL_COMMUNITY): Payer: Self-pay

## 2023-04-21 ENCOUNTER — Other Ambulatory Visit (HOSPITAL_COMMUNITY): Payer: Self-pay

## 2023-05-01 ENCOUNTER — Ambulatory Visit (INDEPENDENT_AMBULATORY_CARE_PROVIDER_SITE_OTHER): Payer: Medicaid Other | Admitting: Internal Medicine

## 2023-05-01 ENCOUNTER — Encounter: Payer: Self-pay | Admitting: Internal Medicine

## 2023-05-01 VITALS — BP 98/70 | HR 84 | Temp 97.7°F | Ht 74.0 in | Wt 171.4 lb

## 2023-05-01 DIAGNOSIS — G4700 Insomnia, unspecified: Secondary | ICD-10-CM | POA: Diagnosis not present

## 2023-05-01 MED ORDER — QUETIAPINE FUMARATE 25 MG PO TABS
25.0000 mg | ORAL_TABLET | Freq: Every day | ORAL | 3 refills | Status: DC
Start: 2023-05-01 — End: 2023-06-21

## 2023-05-01 MED ORDER — DOXEPIN HCL 10 MG PO CAPS
10.0000 mg | ORAL_CAPSULE | Freq: Every day | ORAL | 3 refills | Status: DC
Start: 2023-05-01 — End: 2023-11-24

## 2023-05-01 MED ORDER — ZOLPIDEM TARTRATE 5 MG PO TABS
5.0000 mg | ORAL_TABLET | Freq: Every evening | ORAL | 0 refills | Status: DC | PRN
Start: 2023-05-01 — End: 2023-11-24

## 2023-05-01 NOTE — Progress Notes (Signed)
[facility]: [phone]   Routine Medical Office Visit  Patient:  Robert Yates      Age: 62 y.o.       Sex:  male  Date:   05/01/2023 Patient Care Team: Robert Olszewski, MD as PCP - General (Internal Medicine) Robert Rotunda, MD as PCP - Cardiology (Cardiology) Robert Bal, DO as Consulting Physician (Gastroenterology) Robert Cowden, MD as Consulting Physician (Pulmonary Disease) Robert Sells, MD (Dermatology) Robert Ngo, NP as Nurse Practitioner (Pulmonary Disease) Today's Healthcare Provider: Lula Olszewski, MD   Assessment and Plan:   Assessment and Plan          Robert Yates was seen today for discuss  medication management.  Insomnia, unspecified type Overview: seroquel 50-100 nightly, failed many other meds before "only thing that ever worked" Has been on since around 2022ish  Onset was many years ago. Patient describes symptoms as early morning awakening and frequent night time awakening. Patient has found complete relief with seroquel but not any conservative measures. Associated symptoms include: none. Patient denies anxiety, depression, fatigue, frequent nighttime urination, irritability, leg cramps, and restless legs. Symptoms have been well-controlled.  STOP-Bang Score 05/01/2023  Do you snore loudly?: Yes Do you often feel tired, fatigued, or sleepy during the daytime?: No Has anyone observed you stop breathing during sleep?  No Do you have (or are you being treated for) high blood pressure? No Recent BMI (Calculated):  22  Is BMI greater than 35 kg/m2?:  No Age older than 64 years old?:  Yes Has large neck size > 40 cm: No Gender Male?:  Yes STOP-Bang Total Score(total yes answers):  3   Assessment & Plan: Insomnia: They have had longstanding difficulty falling asleep, currently managed with Quetiapine (Seroquel). However, they are experiencing concerning side effects including facial tics and decreased appetite, with no  evidence of sleep apnea. We will gradually taper off Quetiapine, starting with a reduction to 25mg , and introduce Doxepin as a support medication during the tapering process. A limited supply of Ambien will be provided as a backup medication in case of severe sleep deprivation. They are encouraged to use cognitive behavioral therapy techniques for sleep and check the SnoreLab app to monitor sleep quality and snoring. A follow-up appointment is scheduled in a few weeks to assess progress and adjust the plan as necessary.   Following a detailed discussion about CBT for sleep, and its potential benefits and risks, the patient expressed a preference to monitor the situation for now.  He acknowledged the potential for serious health consequences of delaying this intervention (psychosis from insomnia, medication(s) side effect(s) from sedatives).  In line with his preferences, we will continue to monitor the situation and discuss again as at the next visit or sooner if his condition changes.  I encouraged him to keep me updated on any developments.   Orders: -     Doxepin HCl; Take 1 capsule (10 mg total) by mouth at bedtime.  Dispense: 90 capsule; Refill: 3 -     QUEtiapine Fumarate; Take 1 tablet (25 mg total) by mouth at bedtime.  Dispense: 90 tablet; Refill: 3 -     Zolpidem Tartrate; Take 1 tablet (5 mg total) by mouth at bedtime as needed for sleep (only use if cant sleep without seroquel, to help stay off seroquel.).  Dispense: 10 tablet; Refill: 0   Recommended follow up: 2 weeks  Future Appointments  Date Time Provider Department Center  05/22/2023  9:20  AM Robert Olszewski, MD [facility]  08/14/2023 10:00 AM Robert Olszewski, MD [facility]           Clinical Presentation:    62 y.o. male who has COPD  GOLD at least 2  / active smoking ; Palpitations; Cigarette smoker; Smoker; History of colon polyps; Alternating constipation and diarrhea; Skin lesion of face; Tremor; Back pain; Insomnia;  Multiple adenomatous polyps; Hyperlipidemia; Prediabetes; Ruptured lumbar disc; Murmur; Pulmonary nodule; Pulmonary hypertension (HCC); CAD (coronary artery disease); Aortic atherosclerosis (HCC); Polyp of colon; Cervical spondylosis; Breast tumor; Low HDL (under 40); Polycythemia; and Hyperglycemia on their problem list. His reasons/main concerns/chief complaints for today's office visit are Discuss  medication management   AI-Extracted: Discussed the use of AI scribe software for clinical note transcription with the patient, who gave verbal consent to proceed.  History of Present Illness   The patient, with a history of chronic insomnia, has been on Quetiapine (Seroquel) for approximately two years. They express concern about the long-term side effects of the medication, particularly as they have noticed an increase in involuntary motor movements, such as lip licking and sudden snorting. These symptoms have been sporadic but are becoming more frequent and noticeable.  The patient's primary issue is difficulty falling asleep, which has been a long-standing problem. They attribute this, in part, to a lifelong routine of waking up early, around 5 AM, even on weekends. They report that without the medication, they struggle to fall asleep and stay asleep, often waking up after only two hours.  The patient has tried other sleep aids in the past, including melatonin, but found them ineffective. They express a strong need for a sleep aid, as without it, they find themselves awake until the early hours of the morning. They have not tried cognitive behavioral therapy for sleep or other non-pharmacological interventions.  The patient denies any daytime drowsiness or need for naps, and does not have high blood pressure. They report snoring, as noted by their partner, but deny any episodes of waking up gasping for air. They also note a recent decrease in appetite and subsequent weight loss, which they attribute to  the Quetiapine.  The patient is open to reducing their Quetiapine dosage and trying alternative medications, but expresses concern about potential side effects, particularly the risk of dementia associated with Ambien. They are hesitant about seeing a sleep specialist and are resistant to the idea of stopping Quetiapine abruptly.     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 Outpatient Medications Prior to Visit  Medication Sig   aspirin EC 81 MG tablet Take 1 tablet (81 mg total) by mouth daily. Swallow whole.   fluticasone furoate-vilanterol (BREO ELLIPTA) 100-25 MCG/ACT AEPB Inhale 1 puff into the lungs daily.   metoprolol tartrate (LOPRESSOR) 25 MG tablet Take 0.5 tablets (12.5 mg total) by mouth 2 (two) times daily.   rosuvastatin (CRESTOR) 40 MG tablet Take 1 tablet (40 mg total) by mouth daily. Replaces 20 mg dose, for coronary artery disease (Patient taking differently: Take 40 mg by mouth at bedtime.)   tiZANidine (ZANAFLEX) 2 MG tablet Take 1-2  tablets (2-4 mg total) by mouth every 8 (eight) hours as needed.   [DISCONTINUED] QUEtiapine (SEROQUEL) 100 MG tablet TAKE 1/2 TO 1 TABLET(50 TO 100 MG) BY MOUTH AT BEDTIME (Patient taking differently: Take 50 mg by mouth at bedtime. TAKE 1/2 TO 1 TABLET(50 TO 100 MG) BY MOUTH AT BEDTIME)   No facility-administered medications prior to visit.         Clinical Data Analysis:   Physical Exam  BP 98/70 (BP Location:  Left Arm, Patient Position: Sitting)   Pulse 84   Temp 97.7 F (36.5 C) (Temporal)   Ht 6\' 2"  (1.88 m)   Wt 171 lb 6.6 oz (77.8 kg)   SpO2 94%   BMI 22.01 kg/m  Wt Readings from Last 10 Encounters:  05/01/23 171 lb 6.6 oz (77.8 kg)  02/13/23 175 lb 9.6 oz (79.7 kg)  12/28/22 178 lb 6.4 oz (80.9 kg)  11/15/22 179 lb 6.4 oz (81.4 kg)  11/08/22 181 lb (82.1 kg)  11/01/22 179 lb 9.6 oz (81.5 kg)  07/28/22 173 lb 3.2 oz (78.6 kg)  07/07/22 172 lb 9.6 oz (78.3 kg)  02/21/22 174 lb (78.9 kg)  01/03/22 174 lb (78.9 kg)   Vital signs reviewed.  Nursing notes reviewed. Weight trend reviewed. Abnormalities and Problem-Specific physical exam findings:  frequent lip licking  General Appearance:  No acute distress appreciable.   Well-groomed, healthy-appearing male.  Well proportioned with no abnormal fat distribution.  Good muscle tone. Skin: Clear and well-hydrated. Pulmonary:  Normal work of breathing at rest, no respiratory distress apparent. SpO2: 94 %  Musculoskeletal: All extremities are intact.  Neurological:  Awake, alert, oriented, and engaged.  No obvious focal neurological deficits or cognitive impairments.  Sensorium seems unclouded.   Speech is clear and coherent with logical content. Psychiatric:  Appropriate mood, pleasant and cooperative demeanor, thoughtful and engaged during the exam  Results Reviewed:      No results found for any visits on 05/01/23.  Admission on 02/28/2023, Discharged on 02/28/2023  Component Date Value   SURGICAL PATHOLOGY 02/28/2023                     Value:SURGICAL PATHOLOGY CASE: [id number] PATIENT: [facility] Surgical Pathology Report     Clinical History: Hx polyps (crm)     FINAL MICROSCOPIC DIAGNOSIS:  A. COLON, ASCENDING, POLYPECTOMY:      Sessile serrated polyp without cytologic dysplasia.  B. COLON, TRANSVERSE, POLYPECTOMY:      Sessile serrated polyp without cytologic dysplasia.  C. COLON, DESCENDING,  POLYPECTOMY:      Hyperplastic polyp.      Negative for dysplasia.  D. COLON, SIGMOID, POLYPECTOMY:      Hyperplastic polyp.      Negative for dysplasia.   GROSS DESCRIPTION:  A: Received in formalin are tan, soft tissue fragments that are submitted in toto. Number: Multiple size: Range from 0.1 to 1.2 x 0.2 x 0.1 cm blocks: 1  B: Received in formalin are tan, soft tissue fragments that are submitted in toto. Number: Multiple size: Range from 0.1 to 1.0 x 0.2 x 0.1 cm blocks: 1  C: Specimen is received in formalin and consists of 2 polypoid pieces of tan-red soft tissue, measuring 0.6 c                         m and 1.0 x 0.7 x 0.1 cm.  Possible resection margin of the largest polyp is inked  black, and the polyp is bisected.  The specimen is entirely submitted in 1 cassette.  D: The specimen is received in formalin and consists of multiple polypoid pieces of tan-red soft tissue, ranging from 0.5 cm to 1.3 x 0.7 x 0.2 cm.  The possible resection margins on the 2 largest polyps are inked black, the polyps are bisected.  The specimen is entirely submitted in 3 cassettes.  Lovey Newcomer 02/28/2023)    Final Diagnosis performed by Lance Coon, MD.   Electronically signed 03/01/2023 Technical component performed at [facility], [address] [place], [place] [address].  Professional component performed at [employer]. [facility], [address], [place], [place] [address].  Immunohistochemistry Technical component (if applicable) was performed at [facility]. [address], [address], [place], [place] [address].   IMMUNOHISTOCHEMISTRY DISCLAIMER                          (if applicable): Some of these immunohistochemical stains may have been developed and the performance characteristics determine by [facility]. Some may not have been cleared or approved by the U.S. Food and Drug Administration. The FDA has determined that such  clearance or approval is not necessary. This test is used for clinical purposes. It should not be regarded as investigational or for research. This laboratory is certified under the Clinical Laboratory Improvement Amendments of 1988 (CLIA-88) as qualified to perform high complexity clinical laboratory testing.  The controls stained appropriately.    Glucose-Capillary 02/28/2023 103 (H)   Lab on 02/16/2023  Component Date Value   TSH 02/16/2023 1.15    Sodium 02/16/2023 140    Potassium 02/16/2023 3.8    Chloride 02/16/2023 104    CO2 02/16/2023 27    Glucose, Bld 02/16/2023 134 (H)    BUN 02/16/2023 9    Creatinine, Ser 02/16/2023 0.97    Total Bilirubin 02/16/2023 0.6    Alkaline Phosphatase 02/16/2023 65    AST 02/16/2023 25    ALT 02/16/2023 38    Total Protein 02/16/2023 6.6    Albumin 02/16/2023 4.3    GFR 02/16/2023 82.63    Calcium 02/16/2023 9.7    Cholesterol 02/16/2023 106    Triglycerides 02/16/2023 120.0    HDL 02/16/2023 38.50 (L)    VLDL 02/16/2023 24.0    LDL Cholesterol 02/16/2023 44    Total CHOL/HDL Ratio 02/16/2023 3    NonHDL 02/16/2023 67.76    WBC 02/16/2023 5.1    RBC 02/16/2023 5.56    Hemoglobin 02/16/2023 16.6    HCT 02/16/2023 49.9    MCV 02/16/2023 89.6    MCHC 02/16/2023 33.4    RDW 02/16/2023 15.0    Platelets 02/16/2023 243.0    Neutrophils Relative % 02/16/2023 58.3    Lymphocytes Relative 02/16/2023 28.8    Monocytes Relative 02/16/2023 9.9    Eosinophils Relative 02/16/2023 2.1    Basophils Relative 02/16/2023 0.9    Neutro Abs 02/16/2023 3.0    Lymphs Abs 02/16/2023 1.5    Monocytes Absolute 02/16/2023 0.5    Eosinophils Absolute 02/16/2023 0.1    Basophils Absolute 02/16/2023 0.0   Appointment on 11/24/2022  Component Date Value   Angina Index 11/24/2022 0    Rest HR 11/24/2022 92.0    Rest BP 11/24/2022 142/79    Exercise duration (min) 11/24/2022 3    Exercise duration (sec) 11/24/2022 0    Estimated workload 11/24/2022  4.6  Peak HR 11/24/2022 144    Peak BP 11/24/2022 205/81    MPHR 11/24/2022 156    Percent HR 11/24/2022 92.0    RPE 11/24/2022 15.0    Base ST Depression (mm) 11/24/2022 0    [facility] Treadmill Score 11/24/2022 3    ST Depression (mm) 11/24/2022 0   Office Visit on 07/07/2022  Component Date Value   VITD 07/07/2022 38.86    Sodium 07/07/2022 138    Potassium 07/07/2022 4.3    Chloride 07/07/2022 101    CO2 07/07/2022 27    Glucose, Bld 07/07/2022 66 (L)    BUN 07/07/2022 10    Creatinine, Ser 07/07/2022 0.90    Total Bilirubin 07/07/2022 0.3    Alkaline Phosphatase 07/07/2022 66    AST 07/07/2022 21    ALT 07/07/2022 27    Total Protein 07/07/2022 7.4    Albumin 07/07/2022 4.6    GFR 07/07/2022 90.79    Calcium 07/07/2022 9.9    Cholesterol 07/07/2022 214 (H)    Triglycerides 07/07/2022 340.0 (H)    HDL 07/07/2022 36.50 (L)    VLDL 07/07/2022 68.0 (H)    Total CHOL/HDL Ratio 07/07/2022 6    NonHDL 07/07/2022 177.30    Hgb A1c MFr Bld 07/07/2022 6.1    Direct LDL 07/07/2022 148.0    No image results found.   CT CHEST LCS NODULE F/U LOW DOSE WO CONTRAST  Result Date: 04/10/2023 CLINICAL DATA:  62 year old male current smoker presents for six-month follow-up. EXAM: CT CHEST WITHOUT CONTRAST FOR LUNG CANCER SCREENING NODULE FOLLOW-UP TECHNIQUE: Multidetector CT imaging of the chest was performed following the standard protocol without IV contrast. RADIATION DOSE REDUCTION: This exam was performed according to the departmental dose-optimization program which includes automated exposure control, adjustment of the mA and/or kV according to patient size and/or use of iterative reconstruction technique. COMPARISON:  09/29/2022 screening chest CT. FINDINGS: Cardiovascular: Normal heart size. No significant pericardial effusion/thickening. Left anterior descending coronary atherosclerosis. Atherosclerotic nonaneurysmal thoracic aorta. Stable dilated main pulmonary artery (3.8 cm diameter).  Mediastinum/Nodes: No significant thyroid nodules. Unremarkable esophagus. No pathologically enlarged axillary, mediastinal or hilar lymph nodes, noting limited sensitivity for the detection of hilar adenopathy on this noncontrast study. Lungs/Pleura: No pneumothorax. No pleural effusion. Mild centrilobular emphysema with diffuse bronchial wall thickening. No acute consolidative airspace disease or lung masses. All of the numerous previously visualized scattered bilateral pulmonary nodules are either resolved or stable, including resolution of the previously described dominant 7 mm posteromedial left upper lobe nodule. No significant growth of any of the previously visualized pulmonary nodules. Solitary tiny new left lower lobe pulmonary nodule measuring 3.8 mm in volume derived mean diameter (series 3/image 182). Upper abdomen: Cholecystectomy. Musculoskeletal: No aggressive appearing focal osseous lesions. Mild thoracic spondylosis. Subcutaneous 1.7 cm medial ventral left chest wall cystic lesion, unchanged, compatible with a sebaceous cyst (series 2/image 38). IMPRESSION: 1. Lung-RADS 2, benign appearance or behavior. Continue annual screening with low-dose chest CT without contrast in 12 months. 2. One vessel coronary atherosclerosis. 3. Stable dilated main pulmonary artery, suggesting chronic pulmonary arterial hypertension. 4. Aortic Atherosclerosis (ICD10-I70.0) and Emphysema (ICD10-J43.9). Electronically Signed   By: Delbert Phenix M.D.   On: 04/10/2023 13:52  MM 3D DIAGNOSTIC MAMMOGRAM BILATERAL BREAST  Result Date: 03/02/2023 CLINICAL DATA:  62 year old male presenting for evaluation of a mass in the left breast which patient reports has been there for many years but is now in large Ng. EXAM: DIGITAL DIAGNOSTIC BILATERAL MAMMOGRAM WITH TOMOSYNTHESIS; ULTRASOUND LEFT  BREAST LIMITED TECHNIQUE: Bilateral digital diagnostic mammography and breast tomosynthesis was performed.; Targeted ultrasound examination  of the left breast was performed. COMPARISON:  None available. ACR Breast Density Category a: The breasts are almost entirely fatty. FINDINGS: Mammogram: Right breast: No suspicious mass, distortion, or microcalcifications are identified to suggest presence of malignancy. Left breast: In the far medial left breast there is a superficial oval circumscribed mass measuring approximately 1.9 cm. There are no additional findings elsewhere in the left breast. On physical exam I feel a discrete for Fischel mass in the far medial left breast. Ultrasound: Targeted ultrasound is performed at the palpable site of concern in the left breast at 9 o'clock 8 cm from the nipple demonstrating a oval circumscribed hypoechoic subdermal mass with small cystic spaces overall measuring 1.9 x 1.1 x 1.9 cm, most likely a sebaceous cyst. No definite tract to the skin identified. No internal vascularity. Targeted ultrasound the left axilla demonstrates normal lymph nodes. IMPRESSION: 1. At the palpable site of concern in the left breast at 9 o'clock there is a likely benign mass measuring 1.9 cm, most likely a sebaceous cyst. 2. No mammographic evidence of malignancy in the right breast. RECOMMENDATION: Given reported interval growth of the left breast mass recommend surgical or dermatological consultation for removal of the left breast mass. This most likely represents a sebaceous cyst therefore biopsy is not recommended due to risk of infection. I have discussed the findings and recommendations with the patient. If applicable, a reminder letter will be sent to the patient regarding the next appointment. BI-RADS CATEGORY  3: Probably benign. Electronically Signed   By: Emmaline Kluver M.D.   On: 03/02/2023 10:56  [place] LIMITED ULTRASOUND INCLUDING AXILLA LEFT BREAST   Result Date: 03/02/2023 CLINICAL DATA:  62 year old male presenting for evaluation of a mass in the left breast which patient reports has been there for many years but is  now in large Ng. EXAM: DIGITAL DIAGNOSTIC BILATERAL MAMMOGRAM WITH TOMOSYNTHESIS; ULTRASOUND LEFT BREAST LIMITED TECHNIQUE: Bilateral digital diagnostic mammography and breast tomosynthesis was performed.; Targeted ultrasound examination of the left breast was performed. COMPARISON:  None available. ACR Breast Density Category a: The breasts are almost entirely fatty. FINDINGS: Mammogram: Right breast: No suspicious mass, distortion, or microcalcifications are identified to suggest presence of malignancy. Left breast: In the far medial left breast there is a superficial oval circumscribed mass measuring approximately 1.9 cm. There are no additional findings elsewhere in the left breast. On physical exam I feel a discrete for Fischel mass in the far medial left breast. Ultrasound: Targeted ultrasound is performed at the palpable site of concern in the left breast at 9 o'clock 8 cm from the nipple demonstrating a oval circumscribed hypoechoic subdermal mass with small cystic spaces overall measuring 1.9 x 1.1 x 1.9 cm, most likely a sebaceous cyst. No definite tract to the skin identified. No internal vascularity. Targeted ultrasound the left axilla demonstrates normal lymph nodes. IMPRESSION: 1. At the palpable site of concern in the left breast at 9 o'clock there is a likely benign mass measuring 1.9 cm, most likely a sebaceous cyst. 2. No mammographic evidence of malignancy in the right breast. RECOMMENDATION: Given reported interval growth of the left breast mass recommend surgical or dermatological consultation for removal of the left breast mass. This most likely represents a sebaceous cyst therefore biopsy is not recommended due to risk of infection. I have discussed the findings and recommendations with the patient. If applicable, a reminder letter will be  sent to the patient regarding the next appointment. BI-RADS CATEGORY  3: Probably benign. Electronically Signed   By: Emmaline Kluver M.D.   On:  03/02/2023 10:56      This encounter employed real-time, collaborative documentation. The patient actively reviewed and updated their medical record on a shared screen, ensuring transparency and facilitating joint problem-solving for the problem list, overview, and plan. This approach promotes accurate, informed care. The treatment plan was discussed and reviewed in detail, including medication safety, potential side effects, and all patient questions. We confirmed understanding and comfort with the plan. Follow-up instructions were established, including contacting the office for any concerns, returning if symptoms worsen, persist, or new symptoms develop, and precautions for potential emergency department visits. ----------------------------------------------------- Robert Olszewski, MD  05/01/2023 10:58 AM  [facility]:  [phone]

## 2023-05-01 NOTE — Patient Instructions (Signed)
It was a pleasure seeing you today! Your health and satisfaction are our top priorities.  Glenetta Hew, MD  Your Providers PCP: Lula Olszewski, MD,  [phone]) Referring Provider: Lula Olszewski, MD,  [phone]) Care Team Provider: Rollene Rotunda, MD,  [phone]) Care Team Provider: Lanelle Bal, DO,  [phone]) Care Team Provider: Nyoka Cowden, MD,  [phone]) Care Team Provider: Nita Sells, MD,  [phone]) Care Team Provider: Bevelyn Ngo, NP,  [phone])     NEXT STEPS: [x]  Early Intervention: Schedule sooner appointment, call our on-call services, or go to emergency room if there is any significant Increase in pain or discomfort New or worsening symptoms Sudden or severe changes in your health [x]  Flexible Follow-Up: We recommend a No follow-ups on file. for optimal routine care. This allows for progress monitoring and treatment adjustments. [x]  Preventive Care: Schedule your annual preventive care visit! It's typically covered by insurance and helps identify potential health issues early. [x]  Lab & X-ray Appointments: Incomplete tests scheduled today, or call to schedule. X-rays: [facility] (M-F, 8:30am-noon or 1pm-5pm). [x]  Medical Information Release: Sign a release form at front desk to obtain relevant medical information we don't have.  MAKING THE MOST OF OUR FOCUSED 20 MINUTE APPOINTMENTS: [x]   Clearly state your top concerns at the beginning of the visit to focus our discussion [x]   If you anticipate you will need more time, please inform the front desk during scheduling - we can book multiple appointments in the same week. [x]   If you have transportation problems- use our convenient video appointments or ask about transportation support. [x]   We can get down to business faster if you use MyChart to update information before the visit and submit non-urgent questions before your visit. Thank you for taking the time to  provide details through MyChart.  Let our nurse know and she can import this information into your encounter documents.  Arrival and Wait Times: [x]   Arriving on time ensures that everyone receives prompt attention. [x]   Early morning (8a) and afternoon (1p) appointments tend to have shortest wait times. [x]   Unfortunately, we cannot delay appointments for late arrivals or hold slots during phone calls.  Getting Answers and Following Up [x]   Simple Questions & Concerns: For quick questions or basic follow-up after your visit, reach [place] at [phone] or MyChart messaging. [x]   Complex Concerns: If your concern is more complex, scheduling an appointment might be best. Discuss this with the staff to find the most suitable option. [x]   Lab & Imaging Results: We'll contact you directly if results are abnormal or you don't use MyChart. Most normal results will be on MyChart within 2-3 business days, with a review message from Dr. Jon Billings. Haven't heard back in 2 weeks? Need results sooner? Contact us at [phone]. [x]   Referrals: Our referral coordinator will manage specialist referrals. The specialist's office should contact you within 2 weeks to schedule an appointment. Call us if you haven't heard from them after 2 weeks.  Staying Connected [x]   MyChart: Activate your MyChart for the fastest way to access results and message [place]. See the last page of this paperwork for instructions on how to activate.  Bring to Your Next Appointment [x]   Medications: Please bring all your medication bottles to your next appointment to ensure we have an accurate record of your prescriptions. [x]   Health Diaries: If you're monitoring any health conditions at home, keeping a diary of your readings can be  very helpful for discussions at your next appointment.  Billing [x]   X-ray & Lab Orders: These are billed by separate companies. Contact the invoicing company directly for questions or concerns. [x]   Visit  Charges: Discuss any billing inquiries with our administrative services team.  Your Satisfaction Matters [x]   Share Your Experience: We strive for your satisfaction! If you have any complaints, or preferably compliments, please let Dr. Jon Billings know directly or contact our Practice Administrators, Edwena Felty or [employer], by asking at the front desk.   Reviewing Your Records [x]   Review this early draft of your clinical encounter notes below and the final encounter summary tomorrow on MyChart after its been completed.  All orders placed so far are visible here: Insomnia, unspecified type

## 2023-05-01 NOTE — Assessment & Plan Note (Addendum)
Insomnia: They have had longstanding difficulty falling asleep, currently managed with Quetiapine (Seroquel). However, they are experiencing concerning side effects including facial tics and decreased appetite, with no evidence of sleep apnea. We will gradually taper off Quetiapine, starting with a reduction to 25mg , and introduce Doxepin as a support medication during the tapering process. A limited supply of Ambien will be provided as a backup medication in case of severe sleep deprivation. They are encouraged to use cognitive behavioral therapy techniques for sleep and check the SnoreLab app to monitor sleep quality and snoring. A follow-up appointment is scheduled in a few weeks to assess progress and adjust the plan as necessary.   Following a detailed discussion about CBT for sleep, and its potential benefits and risks, the patient expressed a preference to monitor the situation for now.  He acknowledged the potential for serious health consequences of delaying this intervention (psychosis from insomnia, medication(s) side effect(s) from sedatives).  In line with his preferences, we will continue to monitor the situation and discuss again as at the next visit or sooner if his condition changes.  I encouraged him to keep me updated on any developments.   PDMP reviewed during this encounter.

## 2023-05-05 MED ORDER — FLUTICASONE-SALMETEROL 250-50 MCG/ACT IN AEPB: 1.0000 | INHALATION_SPRAY | Freq: Two times a day (BID) | RESPIRATORY_TRACT | 10 refills | Status: DC

## 2023-05-08 ENCOUNTER — Telehealth: Payer: Self-pay

## 2023-05-08 ENCOUNTER — Other Ambulatory Visit (HOSPITAL_COMMUNITY): Payer: Self-pay

## 2023-05-08 NOTE — Telephone Encounter (Signed)
*  [facility]  Pharmacy Patient Advocate Encounter   Received notification from CoverMyMeds that prior authorization for Wixela Inhub 250-50MCG/ACT aerosol powder  is required/requested.   Insurance verification completed.   The patient is insured through [facility] .   Per test claim: PA required; PA submitted to [facility] via CoverMyMeds Key/confirmation #/[id number] Status is pending

## 2023-05-09 ENCOUNTER — Other Ambulatory Visit (HOSPITAL_COMMUNITY): Payer: Self-pay

## 2023-05-09 NOTE — Telephone Encounter (Signed)
Robert Yates has been approved, [place] requires failure of preferred alternatives (probably [place])

## 2023-05-11 ENCOUNTER — Telehealth: Payer: Self-pay | Admitting: Internal Medicine

## 2023-05-11 ENCOUNTER — Other Ambulatory Visit (HOSPITAL_COMMUNITY): Payer: Self-pay

## 2023-05-11 ENCOUNTER — Encounter: Payer: Self-pay | Admitting: Family Medicine

## 2023-05-11 DIAGNOSIS — Z419 Encounter for procedure for purposes other than remedying health state, unspecified: Secondary | ICD-10-CM | POA: Diagnosis not present

## 2023-05-11 NOTE — Telephone Encounter (Signed)
[employer] calling back per prior encounter they are stating they still need a prior auth on Wixela drug and is advair a generic for this because there saying that adviar would go through please advise

## 2023-05-11 NOTE — Telephone Encounter (Signed)
Brand Advair Diskus is covered.

## 2023-05-17 NOTE — Telephone Encounter (Signed)
Can we close encounter now

## 2023-05-19 NOTE — Telephone Encounter (Signed)
I called and spoke with Pharmacist at [facility] this morning they tried to process his inhaler , and it still stated that he PA in required, I told them per [facility] that this was approved no PA was required. Can you look into this , thanks  this for [facility] and [employer]

## 2023-05-22 ENCOUNTER — Other Ambulatory Visit (HOSPITAL_COMMUNITY): Payer: Self-pay

## 2023-05-22 ENCOUNTER — Ambulatory Visit: Payer: Medicaid Other | Admitting: Internal Medicine

## 2023-05-25 NOTE — Telephone Encounter (Signed)
Brand Advair Diskus is covered, not the generic. Pharmacy must run for preferred brand name.

## 2023-05-26 ENCOUNTER — Other Ambulatory Visit (HOSPITAL_COMMUNITY): Payer: Self-pay

## 2023-05-26 ENCOUNTER — Telehealth: Payer: Self-pay

## 2023-05-26 NOTE — Telephone Encounter (Signed)
As stated in multiple messages, pharmacy must fill for [facility] ADVAIR DISKUS

## 2023-05-26 NOTE — Telephone Encounter (Signed)
Wixella is a generic of Advair Diskus

## 2023-05-26 NOTE — Telephone Encounter (Signed)
Called pharmacy and stated that Brand Advair Diskus was D/C, I think it may be on backorder as I do not see any indication that this has been taken off the market. Medication may have to be changed because insurance will not cover the generic. Brand Symbicort is also showing covered at a $4.00 copay. Elwin Sleight is also showing covered at a $4.00 copay.

## 2023-05-29 NOTE — Telephone Encounter (Signed)
They will not cover the generic/Wixella at all. The Advair Diskus is not listed as being off market at this time so it may be on backorder from the manufacture. See previous notes with alternative covered meds.

## 2023-05-30 NOTE — Telephone Encounter (Signed)
Advair 250 one bid x 3 m supply   Ov before any more rx

## 2023-05-31 MED ORDER — FLUTICASONE-SALMETEROL 250-50 MCG/ACT IN AEPB: 1.0000 | INHALATION_SPRAY | Freq: Two times a day (BID) | RESPIRATORY_TRACT | 0 refills | Status: DC

## 2023-05-31 NOTE — Addendum Note (Signed)
Addended by: Judd Gaudier on: 05/31/2023 09:47 AM   Modules accepted: Orders

## 2023-05-31 NOTE — Telephone Encounter (Signed)
Advair rx sent

## 2023-06-02 ENCOUNTER — Other Ambulatory Visit (HOSPITAL_BASED_OUTPATIENT_CLINIC_OR_DEPARTMENT_OTHER): Payer: Self-pay

## 2023-06-02 MED ORDER — BREO ELLIPTA 100-25 MCG/ACT IN AEPB: 1.0000 | INHALATION_SPRAY | Freq: Every day | RESPIRATORY_TRACT | 11 refills | Status: DC

## 2023-06-08 ENCOUNTER — Ambulatory Visit: Payer: Medicaid Other | Admitting: Internal Medicine

## 2023-06-11 DIAGNOSIS — Z419 Encounter for procedure for purposes other than remedying health state, unspecified: Secondary | ICD-10-CM | POA: Diagnosis not present

## 2023-06-21 ENCOUNTER — Encounter: Payer: Self-pay | Admitting: Internal Medicine

## 2023-06-21 ENCOUNTER — Ambulatory Visit (INDEPENDENT_AMBULATORY_CARE_PROVIDER_SITE_OTHER): Payer: Medicaid Other | Admitting: Internal Medicine

## 2023-06-21 VITALS — BP 100/72 | HR 91 | Temp 98.6°F | Ht 74.0 in | Wt 172.4 lb

## 2023-06-21 DIAGNOSIS — Z7185 Encounter for immunization safety counseling: Secondary | ICD-10-CM | POA: Diagnosis not present

## 2023-06-21 DIAGNOSIS — G2401 Drug induced subacute dyskinesia: Secondary | ICD-10-CM | POA: Diagnosis not present

## 2023-06-21 DIAGNOSIS — G4700 Insomnia, unspecified: Secondary | ICD-10-CM | POA: Diagnosis not present

## 2023-06-21 MED ORDER — TRAZODONE HCL 100 MG PO TABS
100.0000 mg | ORAL_TABLET | Freq: Every day | ORAL | 3 refills | Status: DC
Start: 2023-06-21 — End: 2023-11-24

## 2023-06-21 MED ORDER — RAMELTEON 8 MG PO TABS
8.0000 mg | ORAL_TABLET | Freq: Every day | ORAL | 3 refills | Status: DC
Start: 2023-06-21 — End: 2023-06-29

## 2023-06-21 MED ORDER — QUETIAPINE FUMARATE 100 MG PO TABS: 50.0000 mg | ORAL_TABLET | Freq: Every day | ORAL | 2 refills | Status: DC

## 2023-06-21 NOTE — Patient Instructions (Addendum)
VISIT SUMMARY:  During your visit, we discussed your ongoing issues with insomnia. You've been using Seroquel to manage your sleep disturbances, but you're concerned about potential side effects. We've explored other medications in the past, but they were either ineffective or you had concerns about dependency. We also discussed your partner's snoring and how it might be affecting your sleep. We've decided to continue with Seroquel for now, but we're also going to try high-dose Trazodone as an alternative. We're also considering a referral to a sleep specialist and a potential sleep study in the future.  YOUR PLAN:  -INSOMNIA: Insomnia is a sleep disorder that can make it hard to fall asleep, hard to stay asleep, or cause you to wake up too early and not be able to get back to sleep. We will continue with your current medication, Seroquel, due to its effectiveness. We will also start you on high-dose Trazodone as an alternative to Seroquel. We're also considering a referral to a sleep specialist and a potential sleep study in the future.  INSTRUCTIONS:  We encourage you to use the SnoreLab app to monitor your sleep quality and potential snoring issues. We're also providing information on cognitive behavioral therapy for insomnia (CBT-I), and suggest you try self-guided practice using the ChatGPT app. We've scheduled a follow-up appointment in one month to assess your progress and adjust your treatment plan as necessary.   # Self-Help Cognitive Behavioral Therapy for Insomnia (CBT-I) Guide  ## 1. Sleep Hygiene  - Maintain a consistent sleep schedule, even on weekends - Create a relaxing bedtime routine - Ensure your bedroom is dark, quiet, and cool - Avoid caffeine, alcohol, and large meals close to bedtime - Limit screen time before bed; use blue light filters if necessary  ## 2. Stimulus Control  - Use the bed only for sleep and intimacy - If you can't fall asleep within 20 minutes, get up  and do a calming activity until you feel sleepy - Avoid napping during the day  ## 3. Sleep Restriction  - Track your sleep for a week to determine your average total sleep time - Initially restrict time in bed to this amount (but not less than 5.5 hours) - Gradually increase time in bed by 15-30 minutes per week as sleep efficiency improves - Maintain a consistent wake time regardless of sleep duration  ## 4. Relaxation Techniques  - Progressive Muscle Relaxation: Tense and relax each muscle group sequentially - Deep Breathing: Practice slow, deep breaths to reduce anxiety - Mindfulness Meditation: Focus on the present moment, acknowledging thoughts without judgment  ## 5. Cognitive Restructuring  - Identify negative thoughts about sleep (e.g., "I'll never fall asleep") - Challenge these thoughts with evidence - Replace with more balanced thoughts (e.g., "I might not fall asleep immediately, but relaxing will help")  ## 6. Worry Time  - Set aside 15-20 minutes daily for active worry/problem-solving - If worries arise at night, write them down for the next day's worry time  ## 7. Sleep Diary  - Keep a daily log of sleep times, quality, and daytime symptoms - Use this to track progress and identify patterns  ## 8. Lifestyle Changes  - Regular exercise (but not close to bedtime) - Exposure to natural light during the day - Stress management techniques  ## Implementation Tips  1. Start with one or two techniques and gradually incorporate others 2. Be consistent and patient; improvements may take several weeks 3. If symptoms persist or worsen after 4-6 weeks,  consult a healthcare provider  Remember, these techniques are most effective when practiced consistently over time.

## 2023-06-21 NOTE — Progress Notes (Signed)
[facility]: [phone]   -- Medical Office Visit --  Patient:  Robert Yates      Age: 62 y.o.       Sex:  male  Date:   06/21/2023 Patient Care Team: Lula Olszewski, MD as PCP - General (Internal Medicine) Rollene Rotunda, MD as PCP - Cardiology (Cardiology) Lanelle Bal, DO as Consulting Physician (Gastroenterology) Nyoka Cowden, MD as Consulting Physician (Pulmonary Disease) Nita Sells, MD (Dermatology) Bevelyn Ngo, NP as Nurse Practitioner (Pulmonary Disease) Today's Healthcare Provider: Lula Olszewski, MD      Assessment & Plan Insomnia, unspecified type Insomnia He is currently managing his insomnia with Seroquel but express concerns about potential tardive dyskinesia and lip smacking side effects. Doxepin was previously tried but found to be ineffective, and he has reservations about using Ambien due to fear of dependency. He has not engaged in cognitive behavioral therapy for insomnia (CBT-I) nor utilized sleep monitoring apps. We will continue Seroquel for now due to its efficacy. Additionally, we will start high-dose Trazodone as an alternative to Seroquel and attempt to obtain Rexulti, acknowledging that insurance coverage may be an issue. We encourage him to use the SnoreLab app to monitor sleep quality and potential snoring issues and provide information on CBT-I, suggesting self-guided practice using the ChatGPT app. A referral to a sleep specialist and a potential sleep study will be considered for the future. A follow-up in one month is scheduled to assess progress and adjust the treatment plan as necessary.    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 Education:    - Explained the potential risks of long-term Seroquel use, particularly the tardive dyskinesia and lip-smacking symptoms the patient is experiencing.    - Discussed  the importance of sleep hygiene and cognitive behavioral therapy for insomnia (CBT-I) as first-line treatments for chronic insomnia.    - Informed  the patient about the limited efficacy of long-term use of sedative-hypnotics and the potential for tolerance and dependence.  2. Medication options:    - Continue the tapering plan for Seroquel, as it seems to be causing concerning side effects.    - Since Doxepin was ineffective, consideredd alternative options:      a) Ramelteon: A melatonin receptor agonist with no risk of dependence.      b) Suvorexant or Lemborexant: Orexin receptor antagonists with lower risk of dependence compared to benzodiazepines.      c) Trazodone: An antidepressant often used off-label for insomnia.  3. Non-pharmacological interventions:    - Strongly encourage the patient to engage in CBT-I, which has shown long-term efficacy in treating chronic insomnia.  - he could not afford so we showed him how to use chat gpt to get this and gave additional info in AVS    - Recommended sleep restriction therapy and stimulus control as part of CBT-I.  4. Monitoring and follow-up:    - Encourage the use of SnoreLab to collect data on snoring and sleep quality.    - Schedule regular follow-ups to assess progress and adjust the treatment plan.  5. Specialist referral:    - Given the complexity of the case (chronic insomnia, medication side effects, potential sleep apnea), consider referring the patient to a sleep specialist for a comprehensive evaluation, including a possible sleep study.  He deferred this recommendation, seemingly based on financial concern(s).  Best practices and sources:  1. The American Academy of Sleep Medicine (AASM) guidelines recommend CBT-I as the first-line treatment for chronic insomnia in adults. (Schutte-Rodin et al., J Clin Sleep Med, 2008)  2. The [employer] of [employer] (ACP) also recommends CBT-I as the initial treatment for chronic  insomnia in adults. (Qaseem et al., Ann Intern Med, 2016)  3. The AASM guidelines suggest that pharmacologic therapy can be used in conjunction with CBT-I when necessary, but should not be used as a standalone treatment for chronic insomnia. Mirna Mires et al., J Clin Sleep Med, 2017)  4. A systematic review and meta-analysis published in the Annals of Internal Medicine found that CBT-I is effective in treating insomnia in adults. (Trauer et al., Ann Intern Med, 2015)  5. The [place] Food and Drug Administration (FDA) has approved several non-benzodiazepine medications for insomnia, including ramelteon, suvorexant, and lemborexant, which may have a lower risk of dependence compared to traditional sedative-hypnotics. (FDA Drug Safety Communication, 2019)  In conclusion, while continuing to taper off Seroquel, focus on implementing CBT-I (using ChatGPT) using our AVS and ChatGPT support, and consider alternative medications with lower risk profiles. The referral to a sleep specialist would be beneficial for a comprehensive evaluation and management plan once affordable. Regular follow-ups and data collection using SnoreLab will help in monitoring progress and adjusting the treatment as needed. Tardive dyskinesia We continue to aggressively work to taper seroquel which is almost certainly the cause of this problem.  He deferred psychiatry support for now, seemingly due to financial concern(s). Vaccine counseling We discussed shingles/RSV vaccines and I promoted shingles more due to longer time on market but both have good reputation. Diagnoses and all orders for this visit: Insomnia, unspecified type -     QUEtiapine (SEROQUEL) 100 MG tablet; Take 0.5-1 tablets (50-100 mg total) by mouth at bedtime. -     traZODone (DESYREL) 100 MG tablet; Take 1 tablet (100 mg total) by mouth at bedtime. -     ramelteon (ROZEREM) 8 MG tablet; Take 1 tablet (8 mg total) by mouth at bedtime. Tardive dyskinesia Vaccine  counseling Recommended follow-up: 1-2 months Future Appointments  Date Time Provider Department Center  08/14/2023 10:00 AM Lula Olszewski, MD [facility]  Medical Decision Making: 1 or more chronic illnesses with exacerbation,  progression, or side effects of treatment Prescription drug management  Diagnosis or treatment significantly limited by social determinants of health          Subjective   62 y.o. male who has COPD  GOLD at least 2  / active smoking ; Palpitations; Cigarette smoker; Smoker; History of colon polyps; Alternating constipation and diarrhea; Skin lesion of face; Tremor; Back pain; Insomnia; Multiple adenomatous polyps; Hyperlipidemia; Prediabetes; Ruptured lumbar disc; Murmur; Pulmonary nodule; Pulmonary hypertension (HCC); CAD (coronary artery disease); Aortic atherosclerosis (HCC); Polyp of colon; Cervical spondylosis; Breast tumor; Low HDL (under 40); Polycythemia; and Hyperglycemia on their problem list. His reasons/main concerns/chief complaints for today's office visit are Medication Refill (kolonopine) and Medication Problem (Doxepin did not help with sleep at all.)   ------------------------------------------------------------------------------------------------------------------------ AI-Extracted: Discussed the use of AI scribe software for clinical note transcription with the patient, who gave verbal consent to proceed.  History of Present Illness   The patient, with a history of insomnia, presents with ongoing sleep disturbances despite previous attempts at management. He reports difficulty both falling asleep and staying asleep, necessitating the continued use of Seroquel, a medication he had hoped to discontinue due to concerns about tardive dyskinesia and lip smacking, suspected  side effects of the drug.  The patient had previously tried Doxepin, which proved ineffective, and declined to use Ambien due to fears of dependency. He also reports a long-standing  history of disrupted sleep patterns, which he attributes to a lifetime of early rising and limited sleep due to work demands. However, since retirement, these patterns have become problematic, as the patient no longer feels the same level of physical exhaustion that previously facilitated sleep.  The patient has attempted various non-pharmacological interventions, such as white noise and other calming sounds, but these have not been successful in inducing sleep. He has not yet explored cognitive behavioral therapy for insomnia (CBT-I) or other structured sleep hygiene practices.  The patient also notes that his sleep may be further disrupted by his partner's snoring, though it is unclear to what extent this contributes to his insomnia.      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  Problem list overviews that were updated at today's visit: Problem  Insomnia   seroquel 50-100 nightly, failed many other meds before "only thing that ever worked" Has been on since around 2022ish  Onset was many years ago. Patient describes symptoms as early morning awakening and frequent night time awakening. Patient has found complete relief with seroquel but not any conservative measures. Associated symptoms include: none. Patient denies anxiety, depression, fatigue, frequent nighttime urination, irritability, leg cramps, and restless legs. Symptoms have been well-controlled.  STOP-Bang Score 05/01/2023   Do you snore loudly?: Yes Do you often feel tired, fatigued, or sleepy during the daytime?: No Has anyone observed you stop breathing during sleep?  No Do you have (or are you being treated for) high blood pressure? No Recent BMI (Calculated):  22  Is BMI greater than 35 kg/m2?:  No Age older than 64 years old?:  Yes Has large neck size > 40 cm: No Gender Male?:  Yes STOP-Bang Total Score(total yes answers):  3     Current Outpatient Medications on File Prior to Visit  Medication Sig   aspirin EC 81 MG tablet Take 1 tablet (81 mg total) by mouth daily. Swallow whole.   BREO ELLIPTA 100-25 MCG/ACT AEPB Inhale 1 puff into the lungs daily.   doxepin (SINEQUAN) 10 MG capsule Take 1 capsule (10 mg total) by mouth at bedtime.   fluticasone-salmeterol (ADVAIR) 250-50 MCG/ACT AEPB Inhale 1 puff into the lungs in the morning and at bedtime.   metoprolol tartrate (LOPRESSOR) 25 MG tablet Take 0.5 tablets (12.5 mg total) by mouth 2 (two) times daily.   rosuvastatin (CRESTOR) 40 MG tablet Take 1 tablet (40 mg total) by mouth daily. Replaces 20 mg dose, for coronary artery disease (Patient taking differently: Take 40 mg by mouth at bedtime.)   tiZANidine (ZANAFLEX) 2 MG tablet Take 1-2 tablets (2-4 mg total) by mouth every 8 (eight) hours as needed.   zolpidem (AMBIEN) 5 MG tablet Take 1 tablet (5 mg total) by mouth at bedtime as needed for sleep (only use if cant sleep without seroquel, to help stay off seroquel.).   No current facility-administered medications on file prior to visit.   Medications Discontinued During  This Encounter  Medication Reason   QUEtiapine (SEROQUEL) 25 MG tablet    QUEtiapine (SEROQUEL) 100 MG tablet Reorder     Objective   Physical Exam  BP 100/72 (BP Location: Left Arm, Patient Position: Sitting)   Pulse 91   Temp 98.6 F (37 C) (Temporal)   Ht 6\' 2"  (1.88 m)   Wt 172 lb 6.4 oz (78.2 kg)   SpO2 95%   BMI 22.13 kg/m  Wt Readings from Last 10 Encounters:   06/21/23 172 lb 6.4 oz (78.2 kg)  05/01/23 171 lb 6.6 oz (77.8 kg)  02/13/23 175 lb 9.6 oz (79.7 kg)  12/28/22 178 lb 6.4 oz (80.9 kg)  11/15/22 179 lb 6.4 oz (81.4 kg)  11/08/22 181 lb (82.1 kg)  11/01/22 179 lb 9.6 oz (81.5 kg)  07/28/22 173 lb 3.2 oz (78.6 kg)  07/07/22 172 lb 9.6 oz (78.3 kg)  02/21/22 174 lb (78.9 kg)   Vital signs reviewed.  Nursing notes reviewed. Weight trend reviewed. Abnormalities and Problem-Specific physical exam findings:  seems tired  General Appearance:  No acute distress appreciable.   Well-groomed, healthy-appearing male.  Well proportioned with no abnormal fat distribution.  Good muscle tone. Pulmonary:  Normal work of breathing at rest, no respiratory distress apparent. SpO2: 95 %  Musculoskeletal: All extremities are intact.  Neurological:  Awake, alert, oriented, and engaged.  No obvious focal neurological deficits or cognitive impairments.  Sensorium seems unclouded.   Speech is clear and coherent with logical content. Psychiatric:  Appropriate mood, pleasant and cooperative demeanor, thoughtful and engaged during the exam  Results             No results found for any visits on 06/21/23.  Admission on 02/28/2023, Discharged on 02/28/2023  Component Date Value   SURGICAL PATHOLOGY 02/28/2023                     Value:SURGICAL PATHOLOGY CASE: [id number] PATIENT: [facility] Surgical Pathology Report     Clinical History: Hx polyps (crm)     FINAL MICROSCOPIC DIAGNOSIS:  A. COLON, ASCENDING, POLYPECTOMY:      Sessile serrated polyp without cytologic dysplasia.  B. COLON, TRANSVERSE, POLYPECTOMY:      Sessile serrated polyp without cytologic dysplasia.  C. COLON, DESCENDING, POLYPECTOMY:      Hyperplastic polyp.      Negative for dysplasia.  D. COLON, SIGMOID, POLYPECTOMY:      Hyperplastic polyp.      Negative for dysplasia.   GROSS DESCRIPTION:  A: Received in formalin are tan, soft tissue fragments that  are submitted in toto. Number: Multiple size: Range from 0.1 to 1.2 x 0.2 x 0.1 cm blocks: 1  B: Received in formalin are tan, soft tissue fragments that are submitted in toto. Number: Multiple size: Range from 0.1 to 1.0 x 0.2 x 0.1 cm blocks: 1  C: Specimen is received in formalin and consists of 2 polypoid pieces of tan-red soft tissue, measuring 0.6 c                         m and 1.0 x 0.7 x 0.1 cm.  Possible resection margin of the largest polyp is inked black, and the polyp is bisected.  The specimen is entirely submitted in 1 cassette.  D: The specimen is received in formalin and consists of multiple polypoid pieces of tan-red soft tissue, ranging from 0.5 cm to 1.3 x 0.7 x 0.2 cm.  The possible resection margins on the 2 largest polyps are inked black, the polyps are bisected.  The specimen is entirely submitted in 3 cassettes.  Lovey Newcomer 02/28/2023)    Final Diagnosis performed by Lance Coon, MD.   Electronically signed 03/01/2023 Technical component performed at [facility], [address] [place], [place] [address].  Professional component performed at [employer], [address] [place], [place] [address].  Immunohistochemistry Technical component (if applicable) was performed at [facility]. [address], [place], [place] [address].   IMMUNOHISTOCHEMISTRY DISCLAIMER                          (if applicable): Some of these immunohistochemical stains may have been developed and the performance characteristics determine by [facility]. Some may not have been cleared or approved by the U.S. Food and Drug Administration. The FDA has determined that such clearance or approval is not necessary. This test is used for clinical purposes. It should not be regarded as investigational or for research. This laboratory is certified under the Clinical Laboratory Improvement Amendments of 1988 (CLIA-88) as  qualified to perform high complexity clinical laboratory testing.  The controls stained appropriately.    Glucose-Capillary 02/28/2023 103 (H)   Lab on 02/16/2023  Component Date Value   TSH 02/16/2023 1.15    Sodium 02/16/2023 140    Potassium 02/16/2023 3.8    Chloride 02/16/2023 104    CO2 02/16/2023 27    Glucose, Bld 02/16/2023 134 (H)    BUN 02/16/2023 9    Creatinine, Ser 02/16/2023 0.97    Total Bilirubin 02/16/2023 0.6    Alkaline Phosphatase 02/16/2023 65    AST 02/16/2023 25    ALT 02/16/2023 38    Total Protein 02/16/2023 6.6    Albumin 02/16/2023 4.3    GFR 02/16/2023 82.63    Calcium 02/16/2023 9.7    Cholesterol 02/16/2023 106    Triglycerides 02/16/2023 120.0    HDL 02/16/2023 38.50 (L)    VLDL 02/16/2023 24.0    LDL Cholesterol 02/16/2023 44    Total CHOL/HDL Ratio 02/16/2023 3    NonHDL 02/16/2023 67.76    WBC 02/16/2023 5.1    RBC 02/16/2023 5.56    Hemoglobin 02/16/2023 16.6    HCT 02/16/2023 49.9    MCV 02/16/2023 89.6    MCHC 02/16/2023 33.4    RDW 02/16/2023 15.0    Platelets 02/16/2023 243.0    Neutrophils Relative % 02/16/2023 58.3    Lymphocytes Relative 02/16/2023 28.8    Monocytes Relative 02/16/2023 9.9    Eosinophils Relative 02/16/2023 2.1    Basophils Relative 02/16/2023 0.9    Neutro Abs 02/16/2023 3.0    Lymphs Abs 02/16/2023 1.5    Monocytes Absolute 02/16/2023 0.5    Eosinophils Absolute 02/16/2023 0.1    Basophils Absolute 02/16/2023 0.0   Appointment on 11/24/2022  Component Date Value   Angina Index 11/24/2022 0    Rest HR 11/24/2022 92.0    Rest BP 11/24/2022 142/79    Exercise duration (min) 11/24/2022 3    Exercise duration (sec) 11/24/2022 0    Estimated workload 11/24/2022 4.6    Peak HR 11/24/2022 144    Peak BP 11/24/2022 205/81    MPHR 11/24/2022 156    Percent HR 11/24/2022 92.0    RPE 11/24/2022 15.0    Base ST Depression (mm) 11/24/2022 0    [facility] Treadmill Score 11/24/2022  3    ST Depression (mm)  11/24/2022 0   Office Visit on 07/07/2022  Component Date Value   VITD 07/07/2022 38.86    Sodium 07/07/2022 138    Potassium 07/07/2022 4.3    Chloride 07/07/2022 101    CO2 07/07/2022 27    Glucose, Bld 07/07/2022 66 (L)    BUN 07/07/2022 10    Creatinine, Ser 07/07/2022 0.90    Total Bilirubin 07/07/2022 0.3    Alkaline Phosphatase 07/07/2022 66    AST 07/07/2022 21    ALT 07/07/2022 27    Total Protein 07/07/2022 7.4    Albumin 07/07/2022 4.6    GFR 07/07/2022 90.79    Calcium 07/07/2022 9.9    Cholesterol 07/07/2022 214 (H)    Triglycerides 07/07/2022 340.0 (H)    HDL 07/07/2022 36.50 (L)    VLDL 07/07/2022 68.0 (H)    Total CHOL/HDL Ratio 07/07/2022 6    NonHDL 07/07/2022 177.30    Hgb A1c MFr Bld 07/07/2022 6.1    Direct LDL 07/07/2022 148.0    No image results found.   CT CHEST LCS NODULE F/U LOW DOSE WO CONTRAST  Result Date: 04/10/2023 CLINICAL DATA:  62 year old male current smoker presents for six-month follow-up. EXAM: CT CHEST WITHOUT CONTRAST FOR LUNG CANCER SCREENING NODULE FOLLOW-UP TECHNIQUE: Multidetector CT imaging of the chest was performed following the standard protocol without IV contrast. RADIATION DOSE REDUCTION: This exam was performed according to the departmental dose-optimization program which includes automated exposure control, adjustment of the mA and/or kV according to patient size and/or use of iterative reconstruction technique. COMPARISON:  09/29/2022 screening chest CT. FINDINGS: Cardiovascular: Normal heart size. No significant pericardial effusion/thickening. Left anterior descending coronary atherosclerosis. Atherosclerotic nonaneurysmal thoracic aorta. Stable dilated main pulmonary artery (3.8 cm diameter). Mediastinum/Nodes: No significant thyroid nodules. Unremarkable esophagus. No pathologically enlarged axillary, mediastinal or hilar lymph nodes, noting limited sensitivity for the detection of hilar adenopathy on this noncontrast study.  Lungs/Pleura: No pneumothorax. No pleural effusion. Mild centrilobular emphysema with diffuse bronchial wall thickening. No acute consolidative airspace disease or lung masses. All of the numerous previously visualized scattered bilateral pulmonary nodules are either resolved or stable, including resolution of the previously described dominant 7 mm posteromedial left upper lobe nodule. No significant growth of any of the previously visualized pulmonary nodules. Solitary tiny new left lower lobe pulmonary nodule measuring 3.8 mm in volume derived mean diameter (series 3/image 182). Upper abdomen: Cholecystectomy. Musculoskeletal: No aggressive appearing focal osseous lesions. Mild thoracic spondylosis. Subcutaneous 1.7 cm medial ventral left chest wall cystic lesion, unchanged, compatible with a sebaceous cyst (series 2/image 38). IMPRESSION: 1. Lung-RADS 2, benign appearance or behavior. Continue annual screening with low-dose chest CT without contrast in 12 months. 2. One vessel coronary atherosclerosis. 3. Stable dilated main pulmonary artery, suggesting chronic pulmonary arterial hypertension. 4. Aortic Atherosclerosis (ICD10-I70.0) and Emphysema (ICD10-J43.9). Electronically Signed   By: Delbert Phenix M.D.   On: 04/10/2023 13:52   CT CHEST LCS NODULE F/U LOW DOSE WO CONTRAST  Result Date: 04/10/2023 CLINICAL DATA:  62 year old male current smoker presents for six-month follow-up. EXAM: CT CHEST WITHOUT CONTRAST FOR LUNG CANCER SCREENING NODULE FOLLOW-UP TECHNIQUE: Multidetector CT imaging of the chest was performed following the standard protocol without IV contrast. RADIATION DOSE REDUCTION: This exam was performed according to the departmental dose-optimization program which includes automated exposure control, adjustment of the mA and/or kV according to patient size and/or use of iterative reconstruction technique. COMPARISON:  09/29/2022 screening chest CT. FINDINGS: Cardiovascular:  Normal heart size. No  significant pericardial effusion/thickening. Left anterior descending coronary atherosclerosis. Atherosclerotic nonaneurysmal thoracic aorta. Stable dilated main pulmonary artery (3.8 cm diameter). Mediastinum/Nodes: No significant thyroid nodules. Unremarkable esophagus. No pathologically enlarged axillary, mediastinal or hilar lymph nodes, noting limited sensitivity for the detection of hilar adenopathy on this noncontrast study. Lungs/Pleura: No pneumothorax. No pleural effusion. Mild centrilobular emphysema with diffuse bronchial wall thickening. No acute consolidative airspace disease or lung masses. All of the numerous previously visualized scattered bilateral pulmonary nodules are either resolved or stable, including resolution of the previously described dominant 7 mm posteromedial left upper lobe nodule. No significant growth of any of the previously visualized pulmonary nodules. Solitary tiny new left lower lobe pulmonary nodule measuring 3.8 mm in volume derived mean diameter (series 3/image 182). Upper abdomen: Cholecystectomy. Musculoskeletal: No aggressive appearing focal osseous lesions. Mild thoracic spondylosis. Subcutaneous 1.7 cm medial ventral left chest wall cystic lesion, unchanged, compatible with a sebaceous cyst (series 2/image 38). IMPRESSION: 1. Lung-RADS 2, benign appearance or behavior. Continue annual screening with low-dose chest CT without contrast in 12 months. 2. One vessel coronary atherosclerosis. 3. Stable dilated main pulmonary artery, suggesting chronic pulmonary arterial hypertension. 4. Aortic Atherosclerosis (ICD10-I70.0) and Emphysema (ICD10-J43.9). Electronically Signed   By: Delbert Phenix M.D.   On: 04/10/2023 13:52      Additional Info: This encounter employed real-time, collaborative documentation. The patient actively reviewed and updated their medical record on a shared screen, ensuring transparency and facilitating joint problem-solving for the problem list,  overview, and plan. This approach promotes accurate, informed care. The treatment plan was discussed and reviewed in detail, including medication safety, potential side effects, and all patient questions. We confirmed understanding and comfort with the plan. Follow-up instructions were established, including contacting the office for any concerns, returning if symptoms worsen, persist, or new symptoms develop, and precautions for potential emergency department visits.

## 2023-06-21 NOTE — Assessment & Plan Note (Addendum)
Insomnia He is currently managing his insomnia with Seroquel but express concerns about potential tardive dyskinesia and lip smacking side effects. Doxepin was previously tried but found to be ineffective, and he has reservations about using Ambien due to fear of dependency. He has not engaged in cognitive behavioral therapy for insomnia (CBT-I) nor utilized sleep monitoring apps. We will continue Seroquel for now due to its efficacy. Additionally, we will start high-dose Trazodone as an alternative to Seroquel and attempt to obtain Rexulti, acknowledging that insurance coverage may be an issue. We encourage him to use the SnoreLab app to monitor sleep quality and potential snoring issues and provide information on CBT-I, suggesting self-guided practice using the ChatGPT app. A referral to a sleep specialist and a potential sleep study will be considered for the future. A follow-up in one month is scheduled to assess progress and adjust the treatment plan as necessary.    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 Education:    - Explained the potential risks of long-term Seroquel use, particularly the tardive dyskinesia and lip-smacking symptoms the patient is experiencing.    - Discussed the importance of sleep hygiene and cognitive behavioral therapy for insomnia (CBT-I) as first-line treatments for chronic insomnia.    - Informed  the patient about the limited efficacy of long-term use of sedative-hypnotics and the potential for tolerance and dependence.  2. Medication options:    - Continue the tapering plan for Seroquel, as it seems to be causing concerning side effects.    - Since Doxepin was ineffective, consideredd alternative options:      a) Ramelteon: A melatonin receptor agonist with no risk of dependence.      b) Suvorexant or Lemborexant: Orexin receptor antagonists with lower risk of  dependence compared to benzodiazepines.      c) Trazodone: An antidepressant often used off-label for insomnia.  3. Non-pharmacological interventions:    - Strongly encourage the patient to engage in CBT-I, which has shown long-term efficacy in treating chronic insomnia.  - he could not afford so we showed him how to use chat gpt to get this and gave additional info in AVS    - Recommended sleep restriction therapy and stimulus control as part of CBT-I.  4. Monitoring and follow-up:    - Encourage the use of SnoreLab to collect data on snoring and sleep quality.    - Schedule regular follow-ups to assess progress and adjust the treatment plan.  5. Specialist referral:    - Given the complexity of the case (chronic insomnia, medication side effects, potential sleep apnea), consider referring the patient to a sleep specialist for a comprehensive evaluation, including a possible sleep study.  He deferred this recommendation, seemingly based on financial concern(s).  Best practices and sources:  1. The American Academy of Sleep Medicine (AASM) guidelines recommend CBT-I as the first-line treatment for chronic insomnia in adults. (Schutte-Rodin et al., J Clin Sleep Med, 2008)  2. The [employer] (ACP) also recommends CBT-I as the initial treatment for chronic insomnia in adults. (Qaseem et al., Ann Intern Med, 2016)  3. The AASM guidelines suggest that pharmacologic therapy can be used in conjunction with CBT-I when necessary, but should not be used as a standalone treatment for chronic insomnia. Mirna Mires et al., J Clin Sleep Med, 2017)  4. A systematic review and meta-analysis published in the Annals of Internal Medicine found that  CBT-I is effective in treating insomnia in adults. (Trauer et al., Ann Intern Med, 2015)  5. The [place] Food and Drug Administration (FDA) has approved several non-benzodiazepine medications for insomnia, including ramelteon, suvorexant, and lemborexant,  which may have a lower risk of dependence compared to traditional sedative-hypnotics. (FDA Drug Safety Communication, 2019)  In conclusion, while continuing to taper off Seroquel, focus on implementing CBT-I (using ChatGPT) using our AVS and ChatGPT support, and consider alternative medications with lower risk profiles. The referral to a sleep specialist would be beneficial for a comprehensive evaluation and management plan once affordable. Regular follow-ups and data collection using SnoreLab will help in monitoring progress and adjusting the treatment as needed.

## 2023-06-24 IMAGING — US US ABDOMEN LIMITED
1 series · 14 of 25 positions shown · non-contrast
Comparison: None.

CLINICAL DATA: Right upper quadrant pain

EXAM:
ULTRASOUND ABDOMEN LIMITED RIGHT UPPER QUADRANT

[Series 1: us abdomen limited ruq (liver/gb) · 14 of 74 slices shown]
[im 1/74]
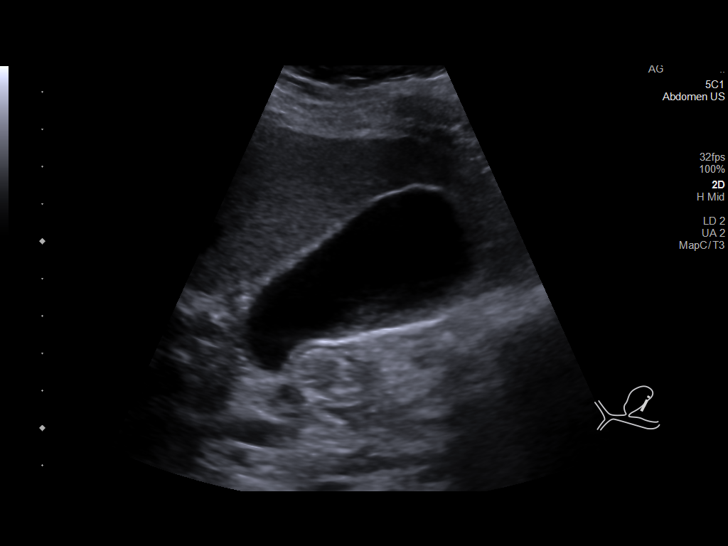
[im 7/74]
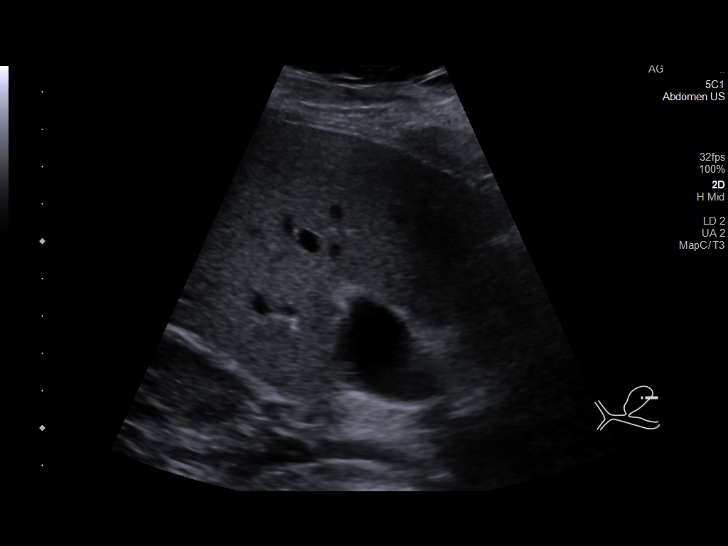
[im 13/74]
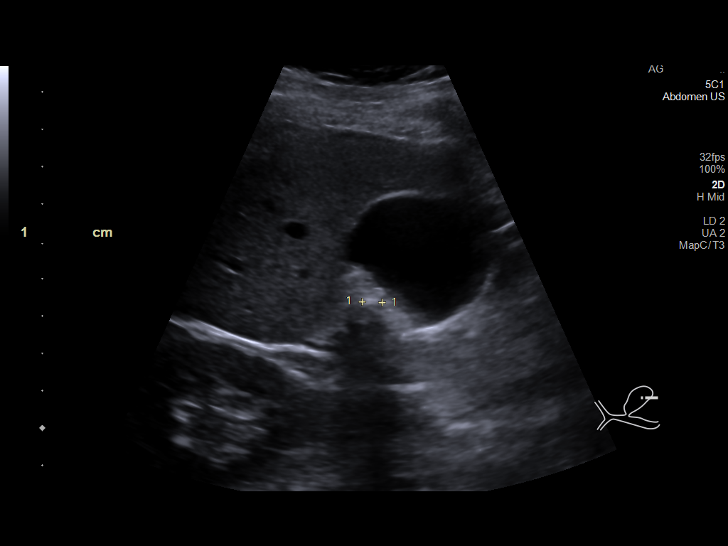
[im 19/74]
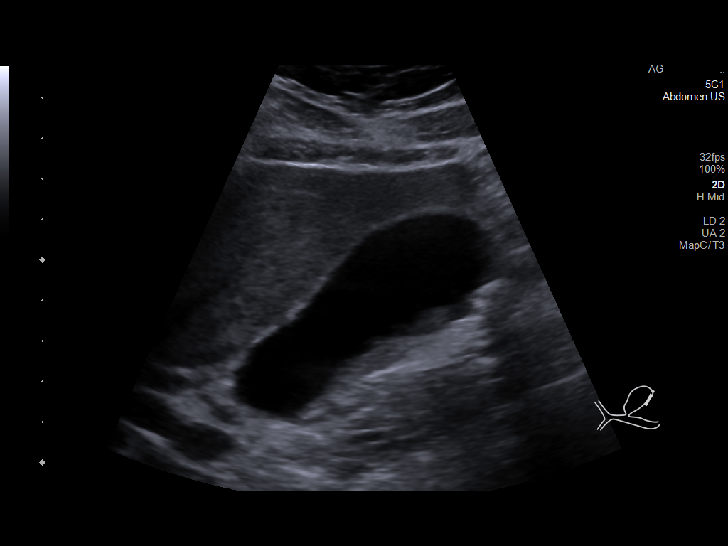
[im 25/74]
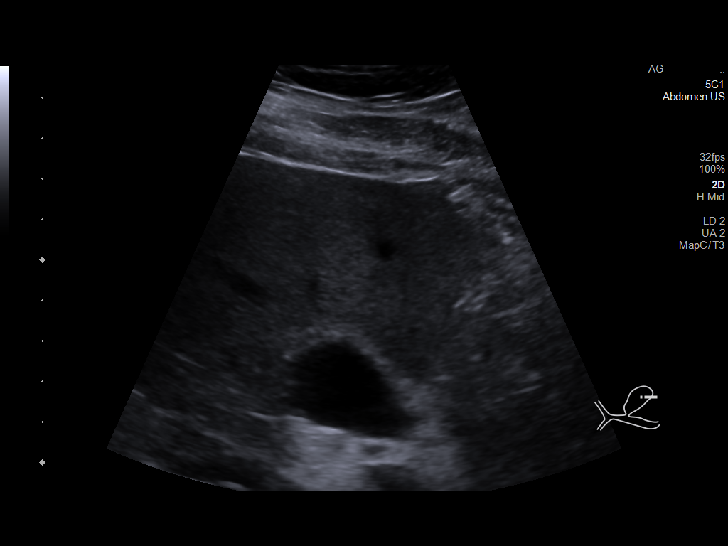
[im 28/74]
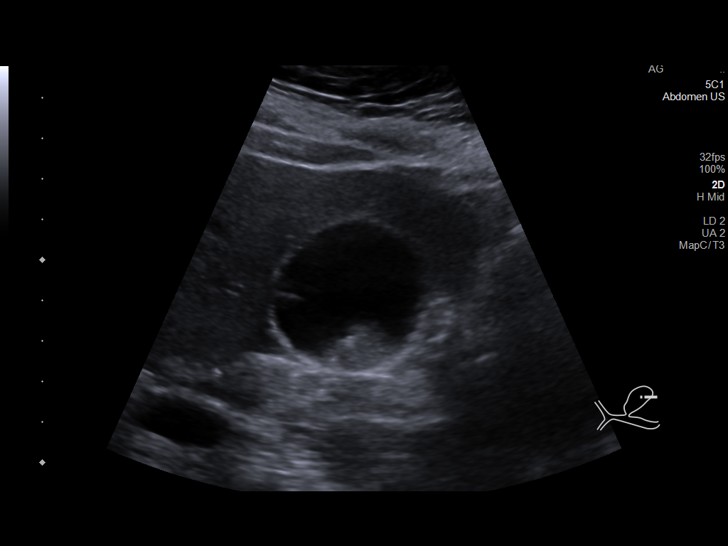
[im 34/74]
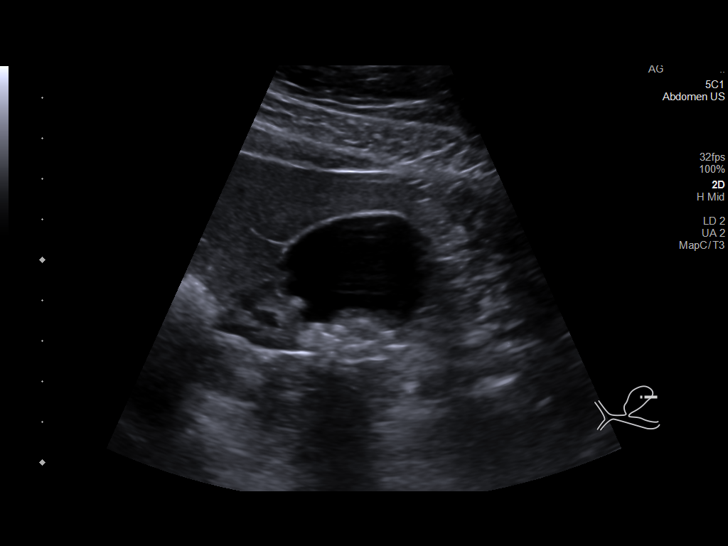
[im 40/74]
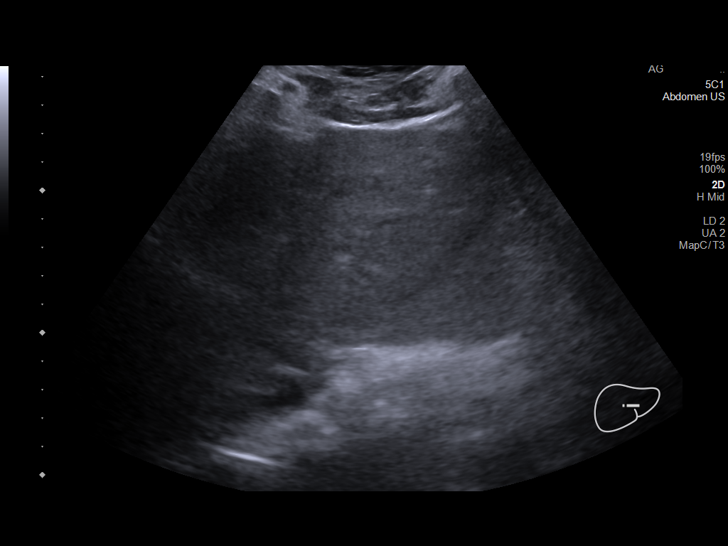
[im 46/74]
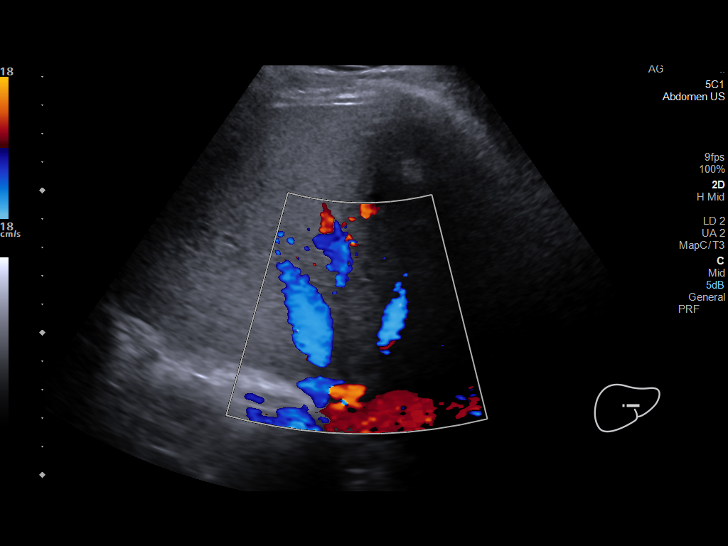
[im 49/74]
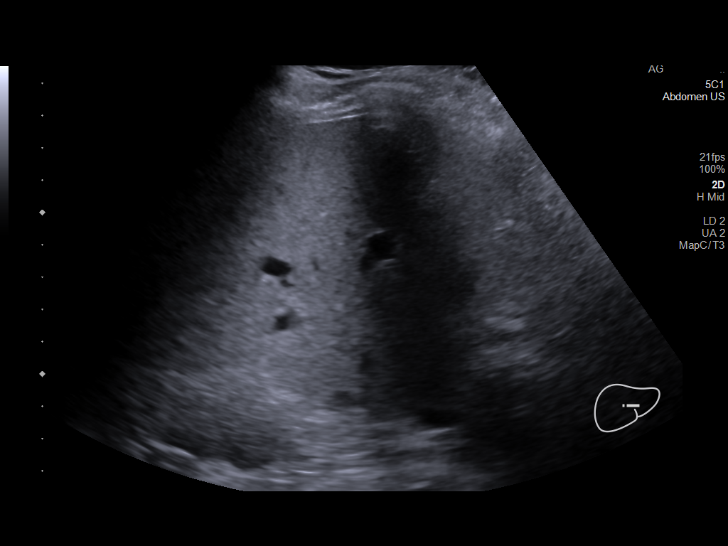
[im 55/74]
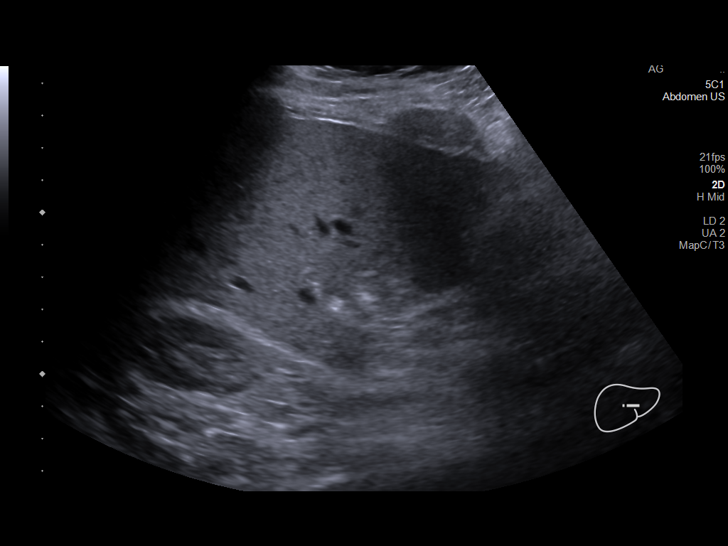
[im 61/74]
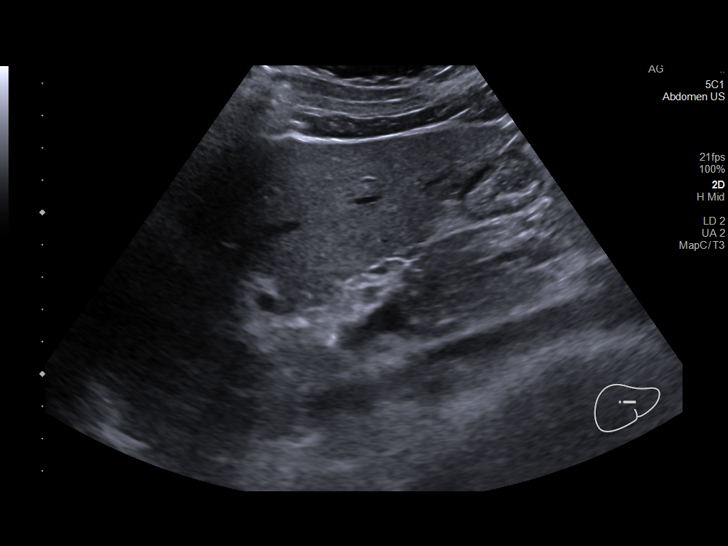
[im 67/74]
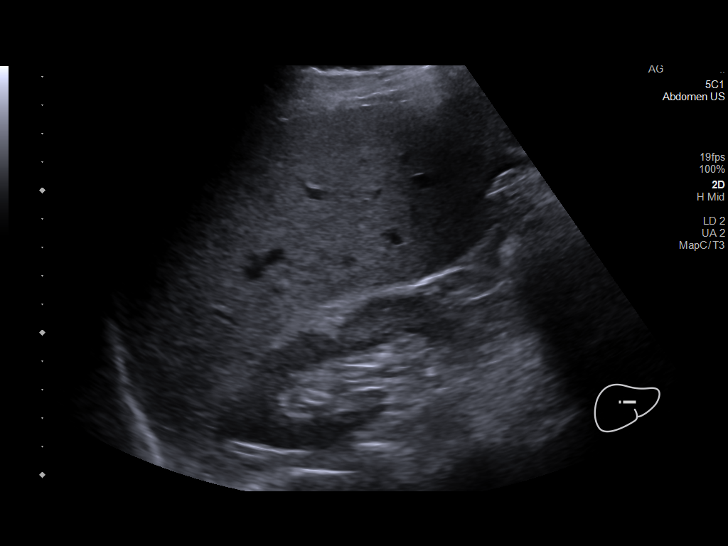
[im 74/74]
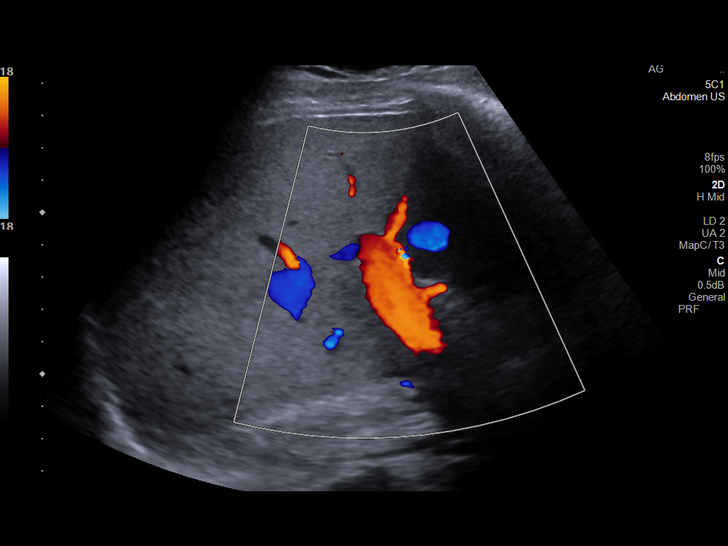

[14 of 25 positions shown; findings below may reference images not displayed]

FINDINGS: Gallbladder:

Small stones and sludge seen in the gallbladder. No gallbladder wall
thickening. Positive Murphy sign noted by sonographer.

Common bile duct:

Diameter: 6 mm

Liver:

No focal lesion identified. Increased parenchymal echogenicity.
Portal vein is patent on color Doppler imaging with normal direction
of blood flow towards the liver.

Other: None.
IMPRESSION: Cholelithiasis and positive Murphy's sign, findings suggest acute
cholecystitis.

Mildly dilated common bile duct. MRCP could be performed if there is
concern for choledocholithiasis.

Hepatic steatosis.

## 2023-06-29 ENCOUNTER — Other Ambulatory Visit: Payer: Self-pay

## 2023-06-29 ENCOUNTER — Other Ambulatory Visit: Payer: Self-pay | Admitting: Internal Medicine

## 2023-06-29 DIAGNOSIS — G4700 Insomnia, unspecified: Secondary | ICD-10-CM

## 2023-06-29 MED ORDER — RAMELTEON 8 MG PO TABS: 8.0000 mg | ORAL_TABLET | Freq: Every day | ORAL | 3 refills | Status: DC

## 2023-06-29 NOTE — Telephone Encounter (Signed)
Updated existing prescription to 90 tablets with 3 refills.

## 2023-07-11 DIAGNOSIS — Z419 Encounter for procedure for purposes other than remedying health state, unspecified: Secondary | ICD-10-CM | POA: Diagnosis not present

## 2023-08-01 ENCOUNTER — Other Ambulatory Visit: Payer: Self-pay | Admitting: Internal Medicine

## 2023-08-01 DIAGNOSIS — G4700 Insomnia, unspecified: Secondary | ICD-10-CM

## 2023-08-09 ENCOUNTER — Other Ambulatory Visit (HOSPITAL_COMMUNITY): Payer: Self-pay

## 2023-08-09 NOTE — Telephone Encounter (Signed)
PA request has been Submitted. New Encounter created for follow up. For additional info see Pharmacy Prior Auth telephone encounter from 10/30.

## 2023-08-10 ENCOUNTER — Telehealth: Payer: Self-pay | Admitting: Internal Medicine

## 2023-08-10 NOTE — Telephone Encounter (Signed)
Approved quetiapine fumarate 08/09/23-10/10/23

## 2023-08-11 DIAGNOSIS — Z419 Encounter for procedure for purposes other than remedying health state, unspecified: Secondary | ICD-10-CM | POA: Diagnosis not present

## 2023-08-14 ENCOUNTER — Ambulatory Visit: Payer: Commercial Managed Care - HMO | Admitting: Internal Medicine

## 2023-08-15 NOTE — Telephone Encounter (Signed)
Called pharmacy, confirmed that this medication is inactive.

## 2023-08-15 NOTE — Telephone Encounter (Signed)
Spoke with patient and he does not want this medication at all and he said that he let the pharmacy know. PA is no longer needed.

## 2023-08-16 ENCOUNTER — Ambulatory Visit: Payer: Commercial Managed Care - HMO | Admitting: Internal Medicine

## 2023-08-28 ENCOUNTER — Other Ambulatory Visit: Payer: Self-pay | Admitting: Internal Medicine

## 2023-08-28 DIAGNOSIS — I2510 Atherosclerotic heart disease of native coronary artery without angina pectoris: Secondary | ICD-10-CM

## 2023-09-10 DIAGNOSIS — Z419 Encounter for procedure for purposes other than remedying health state, unspecified: Secondary | ICD-10-CM | POA: Diagnosis not present

## 2023-09-13 ENCOUNTER — Ambulatory Visit: Payer: Commercial Managed Care - HMO | Admitting: Internal Medicine

## 2023-09-14 IMAGING — DX DG CHEST 2V
2 series · 2 of 2 positions shown · non-contrast
Comparison: 06/23/2021

CLINICAL DATA: Cough

EXAM:
CHEST - 2 VIEW

[chest pa]
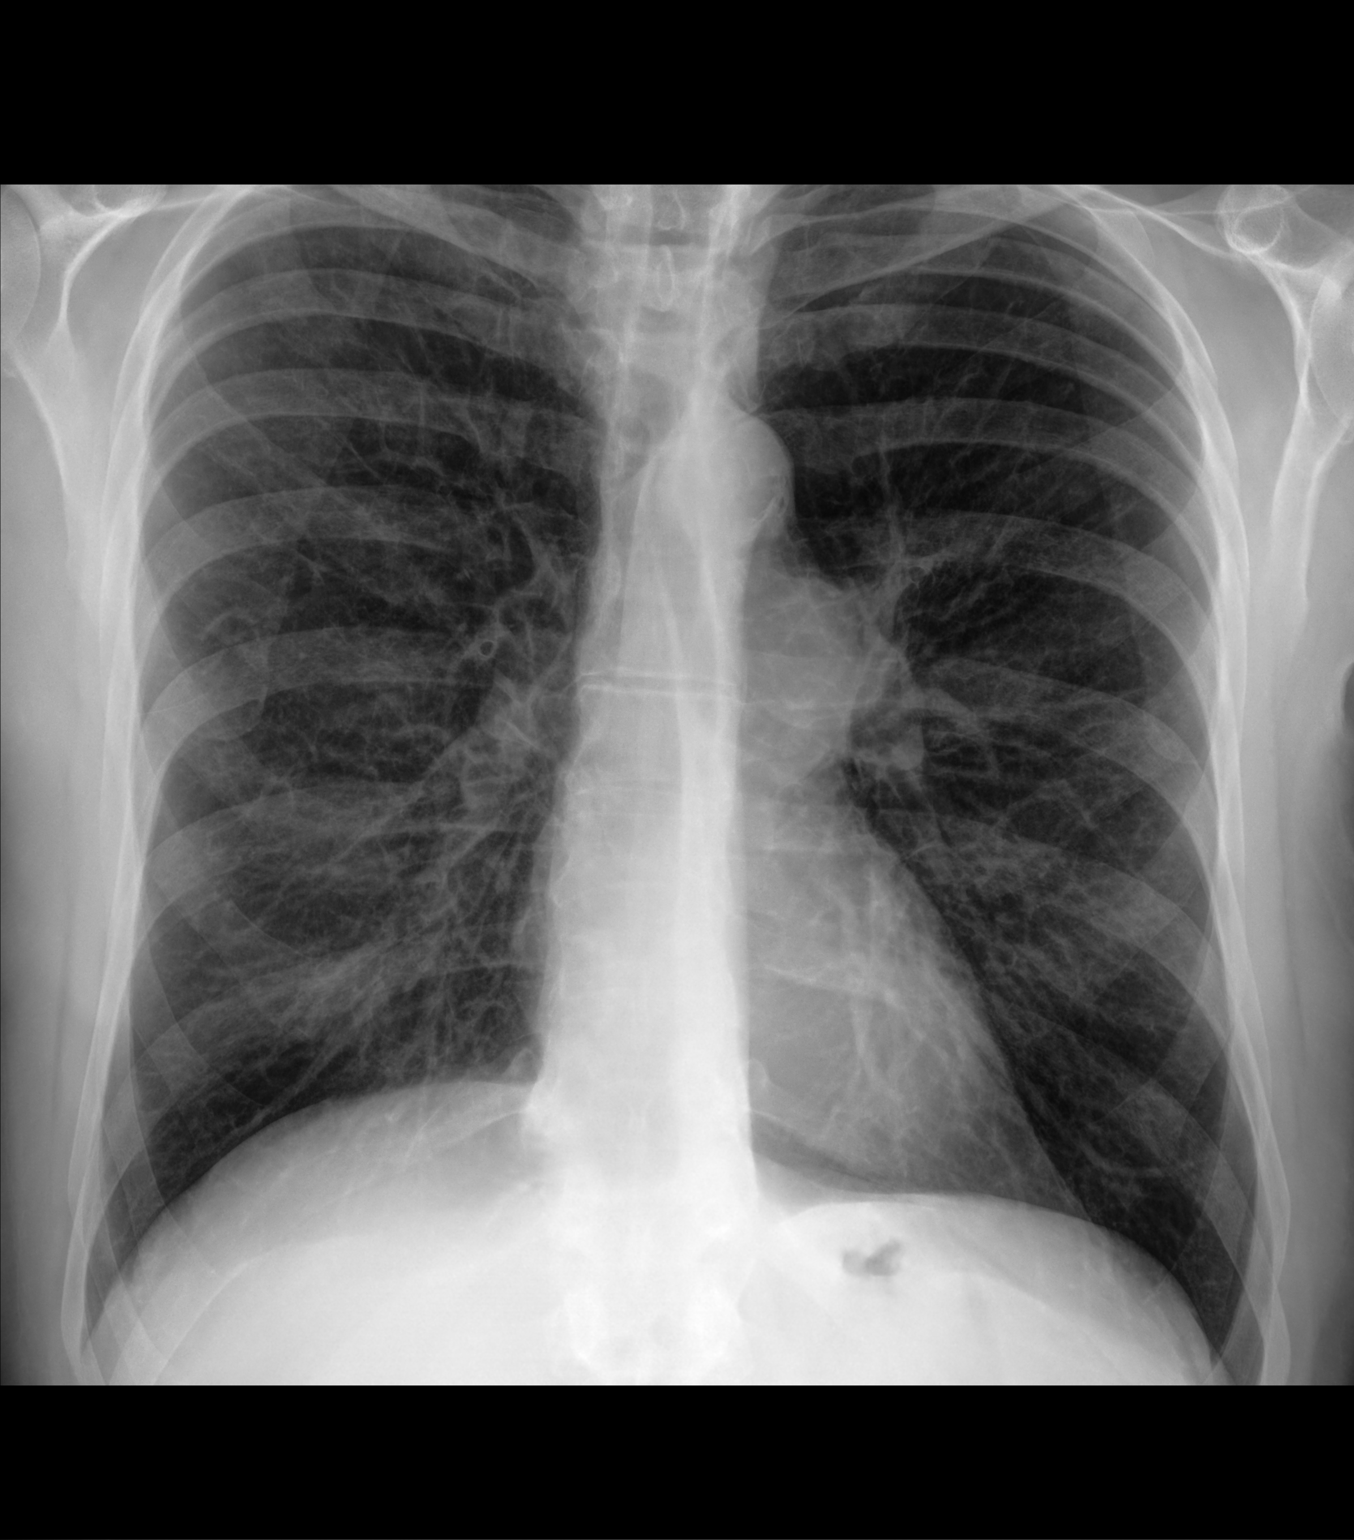

[chest lat]
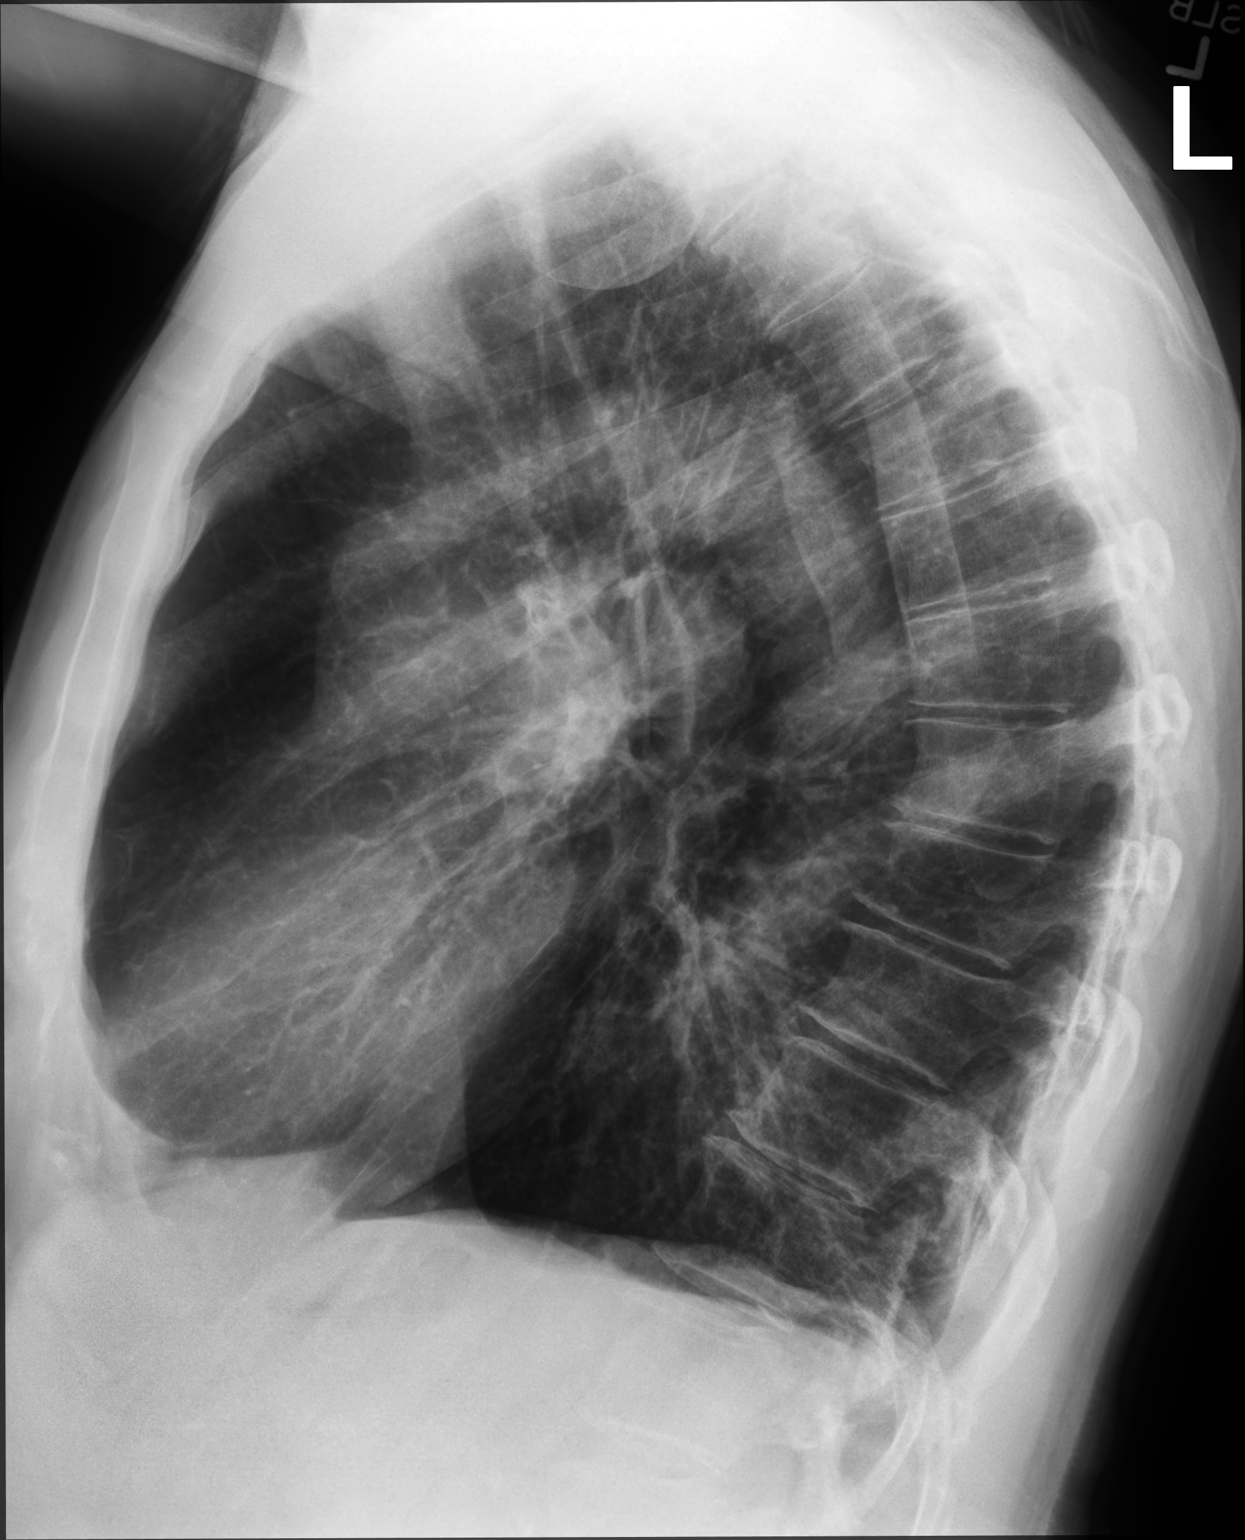

[2 of 2 positions shown; findings below may reference images not displayed]

FINDINGS: The heart size and mediastinal contours are within normal limits.
Both lungs are clear. The visualized skeletal structures are
unremarkable.
IMPRESSION: No active cardiopulmonary disease.

## 2023-09-27 ENCOUNTER — Encounter: Payer: Self-pay | Admitting: Internal Medicine

## 2023-10-03 ENCOUNTER — Other Ambulatory Visit (HOSPITAL_COMMUNITY): Payer: Self-pay

## 2023-10-09 ENCOUNTER — Ambulatory Visit: Payer: Commercial Managed Care - HMO | Admitting: Internal Medicine

## 2023-10-10 ENCOUNTER — Ambulatory Visit (INDEPENDENT_AMBULATORY_CARE_PROVIDER_SITE_OTHER): Payer: Medicaid Other | Admitting: Internal Medicine

## 2023-10-10 ENCOUNTER — Ambulatory Visit
Admission: RE | Admit: 2023-10-10 | Discharge: 2023-10-10 | Disposition: A | Discharge status: Home / Self Care | Payer: Medicaid Other | Source: Ambulatory Visit | Attending: Internal Medicine | Admitting: Internal Medicine

## 2023-10-10 ENCOUNTER — Ambulatory Visit
Admission: RE | Admit: 2023-10-10 | Discharge: 2023-10-10 | Disposition: A | Discharge status: Home / Self Care | Payer: Medicaid Other | Source: Ambulatory Visit | Attending: Internal Medicine

## 2023-10-10 ENCOUNTER — Encounter: Payer: Self-pay | Admitting: Internal Medicine

## 2023-10-10 VITALS — BP 108/66 | HR 98 | Temp 98.1°F | Ht 74.0 in | Wt 161.6 lb

## 2023-10-10 DIAGNOSIS — R051 Acute cough: Secondary | ICD-10-CM | POA: Diagnosis not present

## 2023-10-10 DIAGNOSIS — R634 Abnormal weight loss: Secondary | ICD-10-CM | POA: Diagnosis not present

## 2023-10-10 DIAGNOSIS — R6889 Other general symptoms and signs: Secondary | ICD-10-CM

## 2023-10-10 DIAGNOSIS — I2510 Atherosclerotic heart disease of native coronary artery without angina pectoris: Secondary | ICD-10-CM

## 2023-10-10 DIAGNOSIS — R079 Chest pain, unspecified: Secondary | ICD-10-CM | POA: Diagnosis not present

## 2023-10-10 DIAGNOSIS — R1013 Epigastric pain: Secondary | ICD-10-CM | POA: Diagnosis not present

## 2023-10-10 DIAGNOSIS — B999 Unspecified infectious disease: Secondary | ICD-10-CM | POA: Diagnosis not present

## 2023-10-10 DIAGNOSIS — R69 Illness, unspecified: Secondary | ICD-10-CM

## 2023-10-10 DIAGNOSIS — R0602 Shortness of breath: Secondary | ICD-10-CM | POA: Diagnosis not present

## 2023-10-10 DIAGNOSIS — R0989 Other specified symptoms and signs involving the circulatory and respiratory systems: Secondary | ICD-10-CM

## 2023-10-10 LAB — CBC WITH DIFFERENTIAL/PLATELET
Basophils Absolute: 0 10*3/uL (ref 0.0–0.1)
Basophils Relative: 0.5 % (ref 0.0–3.0)
Eosinophils Absolute: 0.1 10*3/uL (ref 0.0–0.7)
Eosinophils Relative: 0.8 % (ref 0.0–5.0)
HCT: 53 % — ABNORMAL HIGH (ref 39.0–52.0)
Hemoglobin: 17.5 g/dL — ABNORMAL HIGH (ref 13.0–17.0)
Lymphocytes Relative: 16.3 % (ref 12.0–46.0)
Lymphs Abs: 1.5 10*3/uL (ref 0.7–4.0)
MCHC: 33.1 g/dL (ref 30.0–36.0)
MCV: 91.4 fL (ref 78.0–100.0)
Monocytes Absolute: 1 10*3/uL (ref 0.1–1.0)
Monocytes Relative: 10.6 % (ref 3.0–12.0)
Neutro Abs: 6.7 10*3/uL (ref 1.4–7.7)
Neutrophils Relative %: 71.8 % (ref 43.0–77.0)
Platelets: 322 10*3/uL (ref 150.0–400.0)
RBC: 5.8 Mil/uL (ref 4.22–5.81)
RDW: 14.6 % (ref 11.5–15.5)
WBC: 9.4 10*3/uL (ref 4.0–10.5)

## 2023-10-10 LAB — LIPID PANEL
Cholesterol: 119 mg/dL (ref 0–200)
HDL: 39.3 mg/dL (ref >39.00)
LDL Cholesterol: 51 mg/dL (ref 0–99)
NonHDL: 79.65
Total CHOL/HDL Ratio: 3
Triglycerides: 144 mg/dL (ref 0.0–149.0)
VLDL: 28.8 mg/dL (ref 0.0–40.0)

## 2023-10-10 LAB — COMPREHENSIVE METABOLIC PANEL
ALT: 27 U/L (ref 0–53)
AST: 22 U/L (ref 0–37)
Albumin: 4.5 g/dL (ref 3.5–5.2)
Alkaline Phosphatase: 80 U/L (ref 39–117)
BUN: 13 mg/dL (ref 6–23)
CO2: 27 meq/L (ref 19–32)
Calcium: 10.4 mg/dL (ref 8.4–10.5)
Chloride: 102 meq/L (ref 96–112)
Creatinine, Ser: 0.89 mg/dL (ref 0.40–1.50)
GFR: 90.3 mL/min (ref >60.00)
Glucose, Bld: 91 mg/dL (ref 70–99)
Potassium: 3.8 meq/L (ref 3.5–5.1)
Sodium: 140 meq/L (ref 135–145)
Total Bilirubin: 0.5 mg/dL (ref 0.2–1.2)
Total Protein: 7.7 g/dL (ref 6.0–8.3)

## 2023-10-10 LAB — LIPASE: Lipase: 23 U/L (ref 11.0–59.0)

## 2023-10-10 LAB — C-REACTIVE PROTEIN: CRP: 1.4 mg/dL (ref 0.5–20.0)

## 2023-10-10 LAB — AMYLASE: Amylase: 69 U/L (ref 27–131)

## 2023-10-10 LAB — POCT INFLUENZA A/B
Influenza A, POC: NEGATIVE
Influenza B, POC: NEGATIVE

## 2023-10-10 LAB — POC COVID19 BINAXNOW: SARS Coronavirus 2 Ag: NEGATIVE

## 2023-10-10 LAB — BRAIN NATRIURETIC PEPTIDE: Pro B Natriuretic peptide (BNP): 17 pg/mL (ref 0.0–100.0)

## 2023-10-10 LAB — SEDIMENTATION RATE: Sed Rate: 40 mm/h — ABNORMAL HIGH (ref 0–20)

## 2023-10-10 MED ORDER — CEFDINIR 300 MG PO CAPS: 300.0000 mg | ORAL_CAPSULE | Freq: Two times a day (BID) | ORAL | 1 refills | Status: DC

## 2023-10-10 MED ORDER — IOPAMIDOL (ISOVUE-300) INJECTION 61%
100.0000 mL | Freq: Once | INTRAVENOUS | Status: AC | PRN
  Administered 2023-10-10: 100 mL via INTRAVENOUS

## 2023-10-10 NOTE — Progress Notes (Signed)
 ==============================  [facility]: [phone]   -- Medical Office Visit --  Patient: Robert Yates      Age: 62 y.o.       Sex:  male  Date:   10/10/2023 Today's Healthcare Provider: Bernardino KANDICE Cone, MD  ==============================   CHIEF COMPLAINT: Continuous cough (All symptoms since 12/13. Productive, producing yellowish greenish mucus.), Chest congestion, Diarrhea (Intermittent.), Generalized Body Aches, Weakness, Shortness of Breath, Abdominal Pain (Intermittent, internal pain in the last ten days. States it feels the same as gall bladder pain prior to cholecystectomy that was done about two years ago.), and Uintentional weight loss. (Not much of an appetite.)    SUBJECTIVE: 62 y.o. male who has COPD  GOLD at least 2  / active smoking ; Palpitations; Cigarette smoker; Smoker; History of colon polyps; Alternating constipation and diarrhea; Skin lesion of face; Tremor; Back pain; Insomnia; Multiple adenomatous polyps; Hyperlipidemia; Prediabetes; Ruptured lumbar disc; Murmur; Pulmonary nodule; Pulmonary hypertension (HCC); CAD (coronary artery disease); Aortic atherosclerosis (HCC); Polyp of colon; Cervical spondylosis; Breast tumor; Low HDL (under 40); Polycythemia; and Hyperglycemia on their problem list.  Continuous cough All symptoms since 12/13. Productive, producing yellowish greenish mucus.     Chest congestion      Diarrhea Intermittent.     Generalized Body Aches      Weakness And tired, severe     Shortness of Breath      Abdominal Pain Intermittent, epigastric, internal pain in the last ten days. States it feels the same as gall bladder pain prior to cholecystectomy that was done about two years ago. Eating helps sometimes.  Excruciating but only last a second or two sometimes goes away for 5 minutes to  5 hours.       Uintentional weight loss. Not much of an appetite.    Head congestion, very tired, winded,   He  called office last week reporting:  body aches head and chest congestion bad cough very weak no energy diarrhea cold chills.i don't seem to be running a fever but I feel like I am. I've taken Tylenol  and nyquil.i have not covid tested haven't had a appetite so I've barley eaten. I have copd an o I didn't want this to turn into pneumonia  Gets winded to walk up and down a flight of stairs  History of Present Illness The patient, with a history of gallbladder removal, presented with a three-week history of respiratory and gastrointestinal symptoms. They initially experienced a sudden onset of body aches, fever, and extreme fatigue, which led them to bed rest for four to five days. Accompanying these symptoms was significant chest and head congestion, resulting in constant coughing and expectoration of phlegm, as well as frequent nasal discharge. Despite some improvement after the initial four to five days, the patient reported persistent chest and head congestion, fatigue, and shortness of breath.  The patient also reported a significant loss of appetite, which has contributed to an 11-pound weight loss since their last visit in September. They noted that their physical appearance has noticeably changed, with a significant reduction in abdominal girth and thinning of the neck.  About ten days ago, the patient began experiencing intermittent abdominal pain, similar to the pain they experienced during their gallbladder attack two years ago. The pain, located in the same area as the previous gallbladder pain, is not constant but occurs daily. The patient reported that the pain is brief, lasting only a few seconds, but has  been increasing in frequency.  The patient also reported a decrease in their activity level due to their symptoms. They have been unable to attend their part-time job for the past two weeks and become winded with minimal exertion, such as walking to their car or up and down the stairs in  their two-story home.  The patient denied any recent changes in their medication regimen, which includes metoprolol , aspirin , a statin for cholesterol, and melatonin for sleep. They also occasionally take over-the-counter sleep aids. They reported discontinuing quinapin, a medication previously used for sleep, and doxepin , which they tried for a few nights but did not like.  The patient has a history of smoking but reported smoking very little in the past three weeks due to their illness. They also reported a long-standing issue of facial pain, located behind the ear, which has been occurring intermittently for the past couple of years. The pain is severe and seems to be associated with jaw movement or pressure on the side of the face.  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  EXAM: CT CHEST WITHOUT CONTRAST FOR LUNG CANCER SCREENING NODULE FOLLOW-UP   TECHNIQUE: Multidetector CT imaging of the chest was performed following the standard protocol without IV contrast.   RADIATION DOSE REDUCTION: This exam was performed according to the departmental dose-optimization program which includes automated exposure control, adjustment of the mA and/or kV according to patient  size and/or use of iterative reconstruction technique.   COMPARISON:  09/29/2022 screening chest CT.   FINDINGS: Cardiovascular: Normal heart size. No significant pericardial effusion/thickening. Left anterior descending coronary atherosclerosis. Atherosclerotic nonaneurysmal thoracic aorta. Stable dilated main pulmonary artery (3.8 cm diameter).   Mediastinum/Nodes: No significant thyroid nodules. Unremarkable esophagus. No pathologically enlarged axillary, mediastinal or hilar lymph nodes, noting limited sensitivity for the detection of hilar adenopathy on this noncontrast study.   Lungs/Pleura: No pneumothorax. No pleural effusion. Mild centrilobular emphysema with diffuse bronchial wall thickening. No acute consolidative airspace disease or lung masses. All of the numerous previously visualized scattered bilateral pulmonary nodules are either resolved or stable, including resolution of the previously described dominant 7 mm posteromedial left upper lobe nodule. No significant growth of any of the previously visualized pulmonary nodules. Solitary tiny new left lower lobe pulmonary nodule measuring 3.8 mm in volume derived mean diameter (series 3/image 182).   Upper abdomen: Cholecystectomy.   Musculoskeletal: No aggressive appearing focal osseous lesions. Mild thoracic spondylosis. Subcutaneous 1.7 cm medial ventral left chest wall cystic lesion, unchanged, compatible with a sebaceous cyst (series 2/image 38).   IMPRESSION: 1. Lung-RADS 2, benign appearance or behavior. Continue annual screening with low-dose chest CT without contrast in 12 months. 2. One vessel coronary atherosclerosis. 3. Stable dilated main pulmonary artery, suggesting chronic pulmonary arterial hypertension. 4. Aortic Atherosclerosis (ICD10-I70.0) and Emphysema (ICD10-J43.9). He feels no recent medication(s) changes except he stopped doxepin  after it didn't work first 3  to 4 nights. Takes melatonin not  rozerem .  Tobacco Use: High Risk (10/10/2023)   Patient History    Smoking Tobacco Use: Every Day    Smokeless Tobacco Use: Never    Passive Exposure: [name]   Problem list overviews that were updated at today's visit:No problems updated.  Med reconciliation: Current Outpatient Medications on File Prior to Visit  Medication Sig   aspirin  EC 81 MG tablet Take 1 tablet (81 mg total) by mouth daily. Swallow whole.   BREO ELLIPTA  100-25 MCG/ACT AEPB Inhale 1 puff into the lungs daily.   doxepin  (SINEQUAN ) 10 MG capsule Take 1 capsule (10 mg total) by mouth at bedtime.   fluticasone -salmeterol (ADVAIR) 250-50 MCG/ACT AEPB Inhale 1 puff into the lungs in the morning and at bedtime.   metoprolol  tartrate (LOPRESSOR ) 25 MG tablet Take 0.5 tablets (12.5 mg total) by mouth 2 (two) times daily.   QUEtiapine  (SEROQUEL ) 100 MG tablet Take 0.5-1 tablets (50-100 mg total) by mouth at bedtime.   ramelteon  (ROZEREM ) 8 MG tablet Take 1 tablet (8 mg total) by mouth at bedtime.   rosuvastatin  (CRESTOR ) 40 MG tablet Take 1 tablet (40 mg total) by mouth at bedtime.   tiZANidine  (ZANAFLEX ) 2 MG tablet Take 1-2 tablets (2-4 mg total) by mouth every 8 (eight) hours as needed.   traZODone  (DESYREL ) 100 MG tablet Take 1 tablet (100 mg total) by mouth at bedtime.   zolpidem  (AMBIEN ) 5 MG tablet Take 1 tablet (5 mg total) by mouth at bedtime as needed for sleep (only use if cant sleep without seroquel , to help stay off seroquel .).   No current facility-administered medications on file prior to visit.  There are no discontinued medications.    Objective   Physical Exam     10/10/2023   11:02 AM 06/21/2023    3:11 PM 05/01/2023    9:56 AM  Vitals with BMI  Height 6' 2 6' 2 6' 2  Weight 161 lbs 10 oz 172 lbs 6 oz 171 lbs 7 oz  BMI 20.74 22.13 22  Systolic 108 100 98  Diastolic 66 72 70  Pulse 98 91 84   Wt Readings from Last 10 Encounters:  10/10/23 161 lb 9.6 oz (73.3 kg)  06/21/23 172 lb 6.4 oz  (78.2 kg)  05/01/23 171 lb 6.6 oz (77.8 kg)  02/13/23 175 lb 9.6 oz (79.7 kg)  12/28/22 178 lb 6.4 oz (80.9 kg)  11/15/22 179 lb 6.4 oz (81.4 kg)  11/08/22 181 lb (82.1 kg)  11/01/22 179 lb 9.6 oz (81.5 kg)  07/28/22 173 lb 3.2 oz (78.6 kg)  07/07/22 172 lb 9.6 oz (78.3 kg)   Vital signs reviewed.  Nursing notes reviewed. Weight trend reviewed. Abnormalities and Problem-Specific physical exam findings:  lungs sound crackly throughout, frequent sniffling and cough.  Mild epigastric tenderness.  MEASUREMENTS: WT- 161 pounds HEENT: Throat normal. Nasal mucosa with minimal congestion. Appearance gaunt. CHEST: Crackles auscultated. CARDIOVASCULAR: Heart sounds loud, beating hard. Jugular vein distension. ABDOMEN: Localized tenderness upon palpation epigastric. General Appearance:  No acute distress appreciable.   Well-groomed, ill-appearing male.  Well proportioned with no abnormal fat distribution.  Good muscle tone. Pulmonary:  Normal work of breathing at rest, no respiratory distress apparent. SpO2: 97 %  Musculoskeletal: All extremities are intact.  Neurological:  Awake, alert, oriented, and engaged.  No obvious focal neurological deficits or cognitive impairments.  Sensorium seems unclouded.   Speech is clear and coherent with logical content. Psychiatric:  Appropriate  mood, pleasant and cooperative demeanor, thoughtful and engaged during the exam    No results found for any visits on 10/10/23. Admission on 02/28/2023, Discharged on 02/28/2023  Component Date Value   SURGICAL PATHOLOGY 02/28/2023                     Value:SURGICAL PATHOLOGY CASE: [id number] PATIENT: [facility] Surgical Pathology Report     Clinical History: Hx polyps (crm)     FINAL MICROSCOPIC DIAGNOSIS:  A. COLON, ASCENDING, POLYPECTOMY:      Sessile serrated polyp without cytologic dysplasia.  B. COLON, TRANSVERSE, POLYPECTOMY:      Sessile serrated polyp without cytologic dysplasia.  C.  COLON, DESCENDING, POLYPECTOMY:      Hyperplastic polyp.      Negative for dysplasia.  D. COLON, SIGMOID, POLYPECTOMY:      Hyperplastic polyp.      Negative for dysplasia.   GROSS DESCRIPTION:  A: Received in formalin are tan, soft tissue fragments that are submitted in toto. Number: Multiple size: Range from 0.1 to 1.2 x 0.2 x 0.1 cm blocks: 1  B: Received in formalin are tan, soft tissue fragments that are submitted in toto. Number: Multiple size: Range from 0.1 to 1.0 x 0.2 x 0.1 cm blocks: 1  C: Specimen is received in formalin and consists of 2 polypoid pieces of tan-red soft tissue, measuring 0.6 c                         m and 1.0 x 0.7 x 0.1 cm.  Possible resection margin of the largest polyp is inked black, and the polyp is bisected.  The specimen is entirely submitted in 1 cassette.  D: The specimen is received in formalin and consists of multiple polypoid pieces of tan-red soft tissue, ranging from 0.5 cm to 1.3 x 0.7 x 0.2 cm.  The possible resection margins on the 2 largest polyps are inked black, the polyps are bisected.  The specimen is entirely submitted in 3 cassettes.  (KL 02/28/2023)    Final Diagnosis performed by Pepper Dutton, MD.   Electronically signed 03/01/2023 Technical component performed at [facility], [address] [place], [place] [address].  Professional component performed at [employer], [address], [place], [place] [address].  Immunohistochemistry Technical component (if applicable) was performed at [facility]. [address], [place], [place] [address].   IMMUNOHISTOCHEMISTRY DISCLAIMER                          (if applicable): Some of these immunohistochemical stains may have been developed and the performance characteristics determine by [facility]. Some may not have been cleared or approved by the U.S. Food and Drug Administration. The FDA has  determined that such clearance or approval is not necessary. This test is used for clinical purposes. It should not be regarded as investigational or for research. This laboratory is certified under the Clinical Laboratory Improvement Amendments of 1988 (CLIA-88) as qualified to perform high complexity clinical laboratory testing.  The controls stained appropriately.    Glucose-Capillary 02/28/2023 103 (H)   Lab on 02/16/2023  Component Date Value   TSH 02/16/2023 1.15    Sodium 02/16/2023 140    Potassium 02/16/2023 3.8    Chloride 02/16/2023 104    CO2 02/16/2023 27    Glucose, Bld 02/16/2023 134 (H)  BUN 02/16/2023 9    Creatinine, Ser 02/16/2023 0.97    Total Bilirubin 02/16/2023 0.6    Alkaline Phosphatase 02/16/2023 65    AST 02/16/2023 25    ALT 02/16/2023 38    Total Protein 02/16/2023 6.6    Albumin 02/16/2023 4.3    GFR 02/16/2023 82.63    Calcium  02/16/2023 9.7    Cholesterol 02/16/2023 106    Triglycerides 02/16/2023 120.0    HDL 02/16/2023 38.50 (L)    VLDL 02/16/2023 24.0    LDL Cholesterol 02/16/2023 44    Total CHOL/HDL Ratio 02/16/2023 3    NonHDL 02/16/2023 67.76    WBC 02/16/2023 5.1    RBC 02/16/2023 5.56    Hemoglobin 02/16/2023 16.6    HCT 02/16/2023 49.9    MCV 02/16/2023 89.6    MCHC 02/16/2023 33.4    RDW 02/16/2023 15.0    Platelets 02/16/2023 243.0    Neutrophils Relative % 02/16/2023 58.3    Lymphocytes Relative 02/16/2023 28.8    Monocytes Relative 02/16/2023 9.9    Eosinophils Relative 02/16/2023 2.1    Basophils Relative 02/16/2023 0.9    Neutro Abs 02/16/2023 3.0    Lymphs Abs 02/16/2023 1.5    Monocytes Absolute 02/16/2023 0.5    Eosinophils Absolute 02/16/2023 0.1    Basophils Absolute 02/16/2023 0.0   Appointment on 11/24/2022  Component Date Value   Angina Index 11/24/2022 0    Rest HR 11/24/2022 92.0    Rest BP 11/24/2022 142/79    Exercise duration (min) 11/24/2022 3    Exercise duration (sec) 11/24/2022 0    Estimated  workload 11/24/2022 4.6    Peak HR 11/24/2022 144    Peak BP 11/24/2022 205/81    MPHR 11/24/2022 156    Percent HR 11/24/2022 92.0    RPE 11/24/2022 15.0    Base ST Depression (mm) 11/24/2022 0    [facility] Treadmill Score 11/24/2022 3    ST Depression (mm) 11/24/2022 0   No image results found. No results found.CT CHEST LCS NODULE F/U LOW DOSE WO CONTRAST Result Date: 04/10/2023 CLINICAL DATA:  62 year old male current smoker presents for six-month follow-up. EXAM: CT CHEST WITHOUT CONTRAST FOR LUNG CANCER SCREENING NODULE FOLLOW-UP TECHNIQUE: Multidetector CT imaging of the chest was performed following the standard protocol without IV contrast. RADIATION DOSE REDUCTION: This exam was performed according to the departmental dose-optimization program which includes automated exposure control, adjustment of the mA and/or kV according to patient size and/or use of iterative reconstruction technique. COMPARISON:  09/29/2022 screening chest CT. FINDINGS: Cardiovascular: Normal heart size. No significant pericardial effusion/thickening. Left anterior descending coronary atherosclerosis. Atherosclerotic nonaneurysmal thoracic aorta. Stable dilated main pulmonary artery (3.8 cm diameter). Mediastinum/Nodes: No significant thyroid nodules. Unremarkable esophagus. No pathologically enlarged axillary, mediastinal or hilar lymph nodes, noting limited sensitivity for the detection of hilar adenopathy on this noncontrast study. Lungs/Pleura: No pneumothorax. No pleural effusion. Mild centrilobular emphysema with diffuse bronchial wall thickening. No acute consolidative airspace disease or lung masses. All of the numerous previously visualized scattered bilateral pulmonary nodules are either resolved or stable, including resolution of the previously described dominant 7 mm posteromedial left upper lobe nodule. No significant growth of any of the previously visualized pulmonary nodules. Solitary tiny new left lower lobe  pulmonary nodule measuring 3.8 mm in volume derived mean diameter (series 3/image 182). Upper abdomen: Cholecystectomy. Musculoskeletal: No aggressive appearing focal osseous lesions. Mild thoracic spondylosis. Subcutaneous 1.7 cm medial ventral left chest wall cystic lesion, unchanged, compatible with a sebaceous  cyst (series 2/image 38). IMPRESSION: 1. Lung-RADS 2, benign appearance or behavior. Continue annual screening with low-dose chest CT without contrast in 12 months. 2. One vessel coronary atherosclerosis. 3. Stable dilated main pulmonary artery, suggesting chronic pulmonary arterial hypertension. 4. Aortic Atherosclerosis (ICD10-I70.0) and Emphysema (ICD10-J43.9). Electronically Signed   By: Selinda DELENA Blue M.D.   On: 04/10/2023 13:52      Assessment & Plan Flu-like symptoms Respiratory Infection Since September 22, 2023, they have experienced persistent congestion, coughing, and phlegm, testing negative for COVID-19 and flu. Physical examination revealed crackles and jugular vein distension, indicating possible high pulmonary pressure, leading to a differential diagnosis that includes pneumonia among other respiratory infections. We discussed the necessity of a chest x-ray and CT scan, alongside the potential for hospitalization if there is no improvement. We will prescribe cefdinir , a broad-spectrum antibiotic, order a chest x-ray, and a CT scan of the chest, with a follow-up scheduled for next week or sooner if symptoms worsen. Acute cough  Chest congestion Chronic Facial Pain They have experienced intermittent facial pain behind the ear, exacerbated by pressure and certain positions, ongoing for years, not related to current acute symptoms. We discussed monitoring and evaluating if symptoms worsen or become more frequent. Severe comorbid illness  Infection  Coronary artery disease due to calcified coronary lesion  Epigastric pain Intermittent Abdominal Pain They reported intermittent  pain in the same location as previous gallbladder pain, starting ten days ago, lasting a few seconds at a time, not constant or related to food intake. The differential diagnosis includes pancreatic and other gastrointestinal conditions. We discussed the need for a CT scan of the abdomen and blood work, including blood cultures, to further investigate. Unintentional weight loss Unintentional Weight Loss They have lost 11 pounds since September 2024, decreasing from 178 lbs to 161 lbs, associated with decreased appetite and fatigue. We discussed monitoring weight and nutritional intake and evaluating for underlying causes, including infection and gastrointestinal issues.     Orders Placed During this Encounter:   Orders Placed This Encounter  Procedures   POC COVID-19    Previously tested for COVID-19:   No    Resident in a congregate (group) care setting:   No    Employed in healthcare setting:   Unknown   POCT Influenza A/B   No orders of the defined types were placed in this encounter.   General Health Maintenance They continue to smoke, though they have reduced the amount in the last three weeks and receive annual flu shots. We discussed the importance of smoking cessation and continuing annual flu vaccinations.  Follow-up We will follow up next week or sooner if symptoms worsen, coordinate with imaging centers for timely CT scans and chest x-rays, and ensure they understand the urgency of follow-up and the potential need for a hospital visit.    This document was synthesized by artificial intelligence (Abridge) using HIPAA-compliant recording of the clinical interaction;   We discussed the use of AI scribe software for clinical note transcription with the patient, who gave verbal consent to proceed.    Additional Info: This encounter employed state-of-the-art, real-time, collaborative documentation. The patient actively reviewed and assisted in updating their electronic medical record  on a shared screen, ensuring transparency and facilitating joint problem-solving for the problem list, overview, and plan. This approach promotes accurate, informed care. The treatment plan was discussed and reviewed in detail, including medication safety, potential side effects, and all patient questions. We confirmed understanding and comfort with the plan.  Follow-up instructions were established, including contacting the office for any concerns, returning if symptoms worsen, persist, or new symptoms develop, and precautions for potential emergency department visits.

## 2023-10-11 LAB — TSH RFX ON ABNORMAL TO FREE T4: TSH: 1.19 u[IU]/mL (ref 0.450–4.500)

## 2023-10-12 NOTE — Patient Instructions (Signed)
 VISIT SUMMARY:  During your visit, we discussed your ongoing respiratory and gastrointestinal symptoms, including persistent congestion, coughing, fatigue, and intermittent abdominal pain. We also addressed your recent weight loss and chronic facial pain. We reviewed your current medications and smoking history, and we discussed the importance of follow-up care and potential further testing.  YOUR PLAN:  -RESPIRATORY INFECTION: You have been experiencing persistent congestion, coughing, and phlegm since September 22, 2023. These symptoms may indicate a respiratory infection, such as pneumonia. We will start you on cefdinir , a broad-spectrum antibiotic, and have ordered a chest x-ray and a CT scan of your chest. Please follow up next week or sooner if your symptoms worsen.  -INTERMITTENT ABDOMINAL PAIN: You reported intermittent pain in the same location as your previous gallbladder pain. This could be related to pancreatic or other gastrointestinal conditions. We have ordered a CT scan of your abdomen and blood work, including blood cultures, to investigate further.  -UNINTENTIONAL WEIGHT LOSS: You have lost 11 pounds since September 2024, which is associated with decreased appetite and fatigue. We will monitor your weight and nutritional intake and evaluate for underlying causes, including infection and gastrointestinal issues.  -CHRONIC FACIAL PAIN: You have been experiencing intermittent facial pain behind your ear for years, which is exacerbated by pressure and certain positions. We will continue to monitor this and evaluate if the symptoms worsen or become more frequent.  -GENERAL HEALTH MAINTENANCE: You have reduced your smoking in the last three weeks and continue to receive annual flu shots. We discussed the importance of quitting smoking and continuing with your annual flu vaccinations.  INSTRUCTIONS:  Please follow up next week or sooner if your symptoms worsen. We will coordinate with  imaging centers for timely CT scans and chest x-rays. It is important to understand the urgency of follow-up and the potential need for a hospital visit.

## 2023-10-16 ENCOUNTER — Ambulatory Visit: Payer: Medicaid Other | Admitting: Internal Medicine

## 2023-10-16 LAB — CULTURE, BLOOD (SINGLE): MICRO NUMBER:: 15905528

## 2023-10-19 ENCOUNTER — Encounter: Payer: Self-pay | Admitting: Internal Medicine

## 2023-10-19 ENCOUNTER — Ambulatory Visit (INDEPENDENT_AMBULATORY_CARE_PROVIDER_SITE_OTHER): Payer: Medicaid Other | Admitting: Internal Medicine

## 2023-10-19 VITALS — BP 94/60 | HR 86 | Temp 97.8°F | Ht 74.0 in | Wt 163.3 lb

## 2023-10-19 DIAGNOSIS — D751 Secondary polycythemia: Secondary | ICD-10-CM | POA: Diagnosis not present

## 2023-10-19 DIAGNOSIS — A419 Sepsis, unspecified organism: Secondary | ICD-10-CM

## 2023-10-19 DIAGNOSIS — N2889 Other specified disorders of kidney and ureter: Secondary | ICD-10-CM | POA: Diagnosis not present

## 2023-10-19 DIAGNOSIS — N289 Disorder of kidney and ureter, unspecified: Secondary | ICD-10-CM | POA: Diagnosis not present

## 2023-10-19 DIAGNOSIS — I272 Pulmonary hypertension, unspecified: Secondary | ICD-10-CM | POA: Diagnosis not present

## 2023-10-19 DIAGNOSIS — I2510 Atherosclerotic heart disease of native coronary artery without angina pectoris: Secondary | ICD-10-CM

## 2023-10-19 DIAGNOSIS — I2584 Coronary atherosclerosis due to calcified coronary lesion: Secondary | ICD-10-CM

## 2023-10-19 NOTE — Progress Notes (Signed)
 ==============================  [facility]: [phone]   -- Medical Office Visit --  Patient: Robert Yates      Age: 63 y.o.       Sex:  male  Date:   10/19/2023 Today's Healthcare Provider: Bernardino KANDICE Cone, MD  ==============================   CHIEF COMPLAINT: One week follow-up  SUBJECTIVE: 63 y.o. male who has COPD  GOLD at least 2  / active smoking ; Palpitations; Cigarette smoker; Smoker; History of colon polyps; Alternating constipation and diarrhea; Skin lesion of face; Tremor; Back pain; Insomnia; Multiple adenomatous polyps; Hyperlipidemia; Prediabetes; Ruptured lumbar disc; Murmur; Pulmonary nodule; Pulmonary hypertension (HCC); CAD (coronary artery disease); Aortic atherosclerosis (HCC); Polyp of colon; Cervical spondylosis; Breast tumor; Low HDL (under 40); Polycythemia; and Hyperglycemia on their problem list.  History of Present Illness This patient, with a history of gallbladder removal, presents for follow-up after a recent severe illness characterized by flu-like symptoms, cough, chest congestion, diarrhea, body aches, weakness, shortness of breath, abdominal pain, and weight loss. He reports feeling 100% better since the last visit, attributing the improvement to antibiotics.  He had been experiencing severe, intermittent epigastric pain, similar to the pain experienced during previous gallbladder attacks. This pain has significantly decreased in frequency and intensity, now occurring approximately once a day and is not as severe as before.  He also reports a weight gain of 10 pounds since the last visit, suggesting a return to his baseline health status. He is retired but maintains a part-time job, which he was able to return to following his illness.  His daily medication regimen includes metoprolol , rosuvastatin , and melatonin for sleep. He also takes a daily aspirin . He had previously tried prescription sleep aids but decided  to discontinue them due to concerns about addiction and side effects from quinapin.  He has a known history of coronary atherosclerosis and pulmonary arterial hypertension. He has an upcoming appointment with a pulmonologist. He also reports a change in insurance coverage at the end of the month, which may impact future medical decisions.  He has a known claustrophobic condition, which may impact the ability to undergo certain imaging studies. Despite this, he is open to an open MRI to further investigate a small lesion found in the right kidney on a recent CT scan. He also has a small hydrocele, which is not causing any discomfort.  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  Problem list overviews that were updated at today's visit: Problem  Pulmonary Hypertension (Hcc)   On CT lung cancer screening 10/2022 4. Dilatation of the pulmonic trunk and left main pulmonary artery, which may suggest pulmonary arterial hypertension. Further clinical evaluation is recommended.     Med reconciliation: Current Outpatient Medications on File Prior to Visit  Medication Sig   aspirin  EC 81 MG tablet Take 1 tablet (81 mg total) by mouth daily. Swallow whole.   BREO ELLIPTA  100-25 MCG/ACT AEPB Inhale 1 puff into the lungs daily.  cefdinir  (OMNICEF ) 300 MG capsule Take 1 capsule (300 mg total) by mouth 2 (two) times daily.   doxepin  (SINEQUAN ) 10 MG capsule Take 1 capsule (10 mg total) by mouth at bedtime.   fluticasone -salmeterol (ADVAIR) 250-50 MCG/ACT AEPB Inhale 1 puff into the lungs in the morning and at bedtime.   metoprolol  tartrate (LOPRESSOR ) 25 MG tablet Take 0.5 tablets (12.5 mg total) by mouth 2 (two) times daily.   QUEtiapine  (SEROQUEL ) 100 MG tablet Take 0.5-1 tablets (50-100 mg total) by mouth at bedtime.   ramelteon  (ROZEREM ) 8 MG tablet Take 1 tablet (8 mg total) by mouth at bedtime.   rosuvastatin  (CRESTOR ) 40 MG tablet Take 1 tablet (40 mg total) by mouth at bedtime.   tiZANidine  (ZANAFLEX ) 2 MG tablet Take 1-2 tablets (2-4 mg total) by mouth every 8 (eight) hours as needed.   traZODone  (DESYREL ) 100 MG tablet Take 1 tablet (100 mg total) by mouth at bedtime.   zolpidem  (AMBIEN ) 5 MG tablet Take 1 tablet (5 mg total) by mouth at bedtime as needed for sleep (only use if cant sleep without seroquel , to help stay off seroquel .).   No current facility-administered medications on file prior to visit.  There are no discontinued medications. Medications - Metoprolol  - Rosuvastatin  - Melatonin - Aspirin   Social History - Retired - Claustrophobic     Objective   Physical Exam     10/19/2023   11:18 AM 10/10/2023   11:02 AM 06/21/2023    3:11 PM  Vitals with BMI  Height 6' 2 6' 2 6' 2  Weight 163 lbs 5 oz 161 lbs 10 oz 172 lbs 6 oz  BMI 20.96 20.74 22.13  Systolic 94 108 100  Diastolic 60 66 72  Pulse 86 98 91   Wt Readings from Last 10 Encounters:  10/19/23 163 lb 4.8 oz (74.1 kg)  10/10/23 161 lb 9.6 oz (73.3 kg)  06/21/23 172 lb 6.4 oz (78.2 kg)  05/01/23 171 lb 6.6 oz (77.8 kg)  02/13/23 175 lb 9.6 oz (79.7 kg)  12/28/22 178 lb 6.4 oz (80.9 kg)  11/15/22 179 lb 6.4 oz (81.4 kg)  11/08/22 181 lb (82.1 kg)  11/01/22 179 lb 9.6 oz (81.5 kg)  07/28/22 173 lb 3.2 oz (78.6 kg)   Vital  signs reviewed.  Nursing notes reviewed. Weight trend reviewed. Abnormalities and Problem-Specific physical exam findings:  generally appears improving from last week but not as healthy as in prior appointments.  General Appearance:  No acute distress appreciable.   Well-groomed, healthy-appearing male.  Well proportioned with no abnormal fat distribution.  Good muscle tone. Pulmonary:  Normal work of breathing at rest, no respiratory distress apparent. SpO2: 94 %  Musculoskeletal: All extremities are intact.  Neurological:  Awake, alert, oriented, and engaged.  No obvious focal neurological deficits or cognitive impairments.  Sensorium seems unclouded.   Speech is clear and coherent with logical content. Psychiatric:  Appropriate mood, pleasant and cooperative demeanor, thoughtful and engaged during the exam    No results found for any visits on 10/19/23. Office Visit on 10/10/2023  Component Date Value   SARS Coronavirus 2 Ag 10/10/2023 Negative    Influenza A, POC 10/10/2023 Negative    Influenza B, POC 10/10/2023 Negative    Cholesterol 10/10/2023 119    Triglycerides 10/10/2023 144.0    HDL 10/10/2023 39.30    VLDL 10/10/2023 28.8    LDL Cholesterol 10/10/2023 51    Total CHOL/HDL Ratio 10/10/2023 3    NonHDL  10/10/2023 79.65    Sodium 10/10/2023 140    Potassium 10/10/2023 3.8    Chloride 10/10/2023 102    CO2 10/10/2023 27    Glucose, Bld 10/10/2023 91    BUN 10/10/2023 13    Creatinine, Ser 10/10/2023 0.89    Total Bilirubin 10/10/2023 0.5    Alkaline Phosphatase 10/10/2023 80    AST 10/10/2023 22    ALT 10/10/2023 27    Total Protein 10/10/2023 7.7    Albumin 10/10/2023 4.5    GFR 10/10/2023 90.30    Calcium  10/10/2023 10.4    WBC 10/10/2023 9.4    RBC 10/10/2023 5.80    Hemoglobin 10/10/2023 17.5 (H)    HCT 10/10/2023 53.0 (H)    MCV 10/10/2023 91.4    MCHC 10/10/2023 33.1    RDW 10/10/2023 14.6    Platelets 10/10/2023 322.0    Neutrophils Relative % 10/10/2023  71.8    Lymphocytes Relative 10/10/2023 16.3    Monocytes Relative 10/10/2023 10.6    Eosinophils Relative 10/10/2023 0.8    Basophils Relative 10/10/2023 0.5    Neutro Abs 10/10/2023 6.7    Lymphs Abs 10/10/2023 1.5    Monocytes Absolute 10/10/2023 1.0    Eosinophils Absolute 10/10/2023 0.1    Basophils Absolute 10/10/2023 0.0    TSH 10/10/2023 1.190    Amylase 10/10/2023 69    Lipase 10/10/2023 23.0    MICRO NUMBER: 10/10/2023 [id number]    SPECIMEN QUALITY: 10/10/2023 Suboptimal    Source 10/10/2023 NOT GIVEN    STATUS: 10/10/2023 FINAL    Result: 10/10/2023 No growth after 5 days Inspection of blood culture bottles indicates that an inadequate volume of blood may have been collected for the detection of sepsis.    COMMENT: 10/10/2023 Aerobic and anaerobic bottle received.    Pro B Natriuretic peptid* 10/10/2023 17.0    Sed Rate 10/10/2023 40 (H)    CRP 10/10/2023 1.4   Admission on 02/28/2023, Discharged on 02/28/2023  Component Date Value   SURGICAL PATHOLOGY 02/28/2023       Surgical Pathology Report     Clinical History: Hx polyps (crm)     FINAL MICROSCOPIC DIAGNOSIS:  A. COLON, ASCENDING, POLYPECTOMY:      Sessile serrated polyp without cytologic dysplasia.  B. COLON, TRANSVERSE, POLYPECTOMY:      Sessile serrated polyp without cytologic dysplasia.  C. COLON, DESCENDING, POLYPECTOMY:      Hyperplastic polyp.      Negative for dysplasia.  D. COLON, SIGMOID, POLYPECTOMY:      Hyperplastic polyp.      Negative for dysplasia.        Glucose-Capillary 02/28/2023 103 (H)   Lab on 02/16/2023  Component Date Value   TSH 02/16/2023 1.15    Sodium 02/16/2023 140    Potassium 02/16/2023 3.8    Chloride 02/16/2023 104    CO2 02/16/2023 27    Glucose, Bld 02/16/2023 134 (H)    BUN 02/16/2023 9    Creatinine, Ser 02/16/2023 0.97    Total Bilirubin 02/16/2023 0.6    Alkaline Phosphatase 02/16/2023 65    AST 02/16/2023 25    ALT 02/16/2023 38    Total  Protein 02/16/2023 6.6    Albumin 02/16/2023 4.3    GFR 02/16/2023 82.63    Calcium  02/16/2023 9.7    Cholesterol 02/16/2023 106    Triglycerides 02/16/2023 120.0    HDL 02/16/2023 38.50 (L)    VLDL 02/16/2023 24.0    LDL Cholesterol 02/16/2023 44    Total  CHOL/HDL Ratio 02/16/2023 3    NonHDL 02/16/2023 67.76    WBC 02/16/2023 5.1    RBC 02/16/2023 5.56    Hemoglobin 02/16/2023 16.6    HCT 02/16/2023 49.9    MCV 02/16/2023 89.6    MCHC 02/16/2023 33.4    RDW 02/16/2023 15.0    Platelets 02/16/2023 243.0    Neutrophils Relative % 02/16/2023 58.3    Lymphocytes Relative 02/16/2023 28.8    Monocytes Relative 02/16/2023 9.9    Eosinophils Relative 02/16/2023 2.1    Basophils Relative 02/16/2023 0.9    Neutro Abs 02/16/2023 3.0    Lymphs Abs 02/16/2023 1.5    Monocytes Absolute 02/16/2023 0.5    Eosinophils Absolute 02/16/2023 0.1    Basophils Absolute 02/16/2023 0.0   Appointment on 11/24/2022  Component Date Value   Angina Index 11/24/2022 0    Rest HR 11/24/2022 92.0    Rest BP 11/24/2022 142/79    Exercise duration (min) 11/24/2022 3    Exercise duration (sec) 11/24/2022 0    Estimated workload 11/24/2022 4.6    Peak HR 11/24/2022 144    Peak BP 11/24/2022 205/81    MPHR 11/24/2022 156    Percent HR 11/24/2022 92.0    RPE 11/24/2022 15.0    Base ST Depression (mm) 11/24/2022 0    [facility] Treadmill Score 11/24/2022 3    ST Depression (mm) 11/24/2022 0   No image results found. CT ABDOMEN PELVIS W CONTRAST Result Date: 10/10/2023 CLINICAL DATA:  Abdominal pain, weight loss EXAM: CT ABDOMEN AND PELVIS WITH CONTRAST TECHNIQUE: Multidetector CT imaging of the abdomen and pelvis was performed using the standard protocol following bolus administration of intravenous contrast. RADIATION DOSE REDUCTION: This exam was performed according to the departmental dose-optimization program which includes automated exposure control, adjustment of the mA and/or kV according to patient size  and/or use of iterative reconstruction technique. CONTRAST:  100mL ISOVUE -300 IOPAMIDOL  (ISOVUE -300) INJECTION 61% COMPARISON:  06/23/2021 FINDINGS: Lower chest: Minimal nodular airspace disease within the left lower lobe may be inflammatory or infectious. Hepatobiliary: No focal liver abnormality is seen. Status post cholecystectomy. No biliary dilatation. Pancreas: Unremarkable. No pancreatic ductal dilatation or surrounding inflammatory changes. Spleen: Normal in size without focal abnormality. Adrenals/Urinary Tract: There is indeterminate 1 cm hypodensity lower pole right kidney, measuring 7582 reference image 31/2. Otherwise the kidneys enhance normally and symmetrically. No urinary tract calculi or obstructive uropathy. The adrenals and bladder are unremarkable. Stomach/Bowel: No bowel obstruction or ileus. Normal appendix right lower quadrant. No bowel wall thickening or inflammatory change. Vascular/Lymphatic: Aortic atherosclerosis. No enlarged abdominal or pelvic lymph nodes. Reproductive: Prostate is unremarkable. Right hydrocele incidentally noted. Other: No free fluid or free intraperitoneal gas. No abdominal wall hernia. Musculoskeletal: No acute or destructive bony abnormalities. Reconstructed images demonstrate no additional findings. IMPRESSION: 1. No acute intra-abdominal or intrapelvic process. 2. Small right hydrocele partially visualized, of uncertain clinical significance. 3. Indeterminate 1 cm lesion lower pole right kidney. Further evaluation with dedicated abdominal MRI should be considered. 4.  Aortic Atherosclerosis (ICD10-I70.0). Electronically Signed   By: Ozell Daring M.D.   On: 10/10/2023 16:41   DG Chest 2 View Result Date: 10/10/2023 CLINICAL DATA:  Chest pain, shortness of breath EXAM: CHEST - 2 VIEW COMPARISON:  09/13/2021 FINDINGS: Lungs are clear.  No pneumothorax. Heart size normal. Dilated pulmonary artery as before. Aortic Atherosclerosis (ICD10-170.0). No effusion.  Visualized bones unremarkable. IMPRESSION: 1. No acute findings. 2. Dilated pulmonary artery suggesting pulmonary arterial hypertension. Electronically Signed  By: JONETTA Faes M.D.   On: 10/10/2023 16:08  CT ABDOMEN PELVIS W CONTRAST Result Date: 10/10/2023 CLINICAL DATA:  Abdominal pain, weight loss EXAM: CT ABDOMEN AND PELVIS WITH CONTRAST TECHNIQUE: Multidetector CT imaging of the abdomen and pelvis was performed using the standard protocol following bolus administration of intravenous contrast. RADIATION DOSE REDUCTION: This exam was performed according to the departmental dose-optimization program which includes automated exposure control, adjustment of the mA and/or kV according to patient size and/or use of iterative reconstruction technique. CONTRAST:  100mL ISOVUE -300 IOPAMIDOL  (ISOVUE -300) INJECTION 61% COMPARISON:  06/23/2021 FINDINGS: Lower chest: Minimal nodular airspace disease within the left lower lobe may be inflammatory or infectious. Hepatobiliary: No focal liver abnormality is seen. Status post cholecystectomy. No biliary dilatation. Pancreas: Unremarkable. No pancreatic ductal dilatation or surrounding inflammatory changes. Spleen: Normal in size without focal abnormality. Adrenals/Urinary Tract: There is indeterminate 1 cm hypodensity lower pole right kidney, measuring 7582 reference image 31/2. Otherwise the kidneys enhance normally and symmetrically. No urinary tract calculi or obstructive uropathy. The adrenals and bladder are unremarkable. Stomach/Bowel: No bowel obstruction or ileus. Normal appendix right lower quadrant. No bowel wall thickening or inflammatory change. Vascular/Lymphatic: Aortic atherosclerosis. No enlarged abdominal or pelvic lymph nodes. Reproductive: Prostate is unremarkable. Right hydrocele incidentally noted. Other: No free fluid or free intraperitoneal gas. No abdominal wall hernia. Musculoskeletal: No acute or destructive bony abnormalities. Reconstructed images  demonstrate no additional findings. IMPRESSION: 1. No acute intra-abdominal or intrapelvic process. 2. Small right hydrocele partially visualized, of uncertain clinical significance. 3. Indeterminate 1 cm lesion lower pole right kidney. Further evaluation with dedicated abdominal MRI should be considered. 4.  Aortic Atherosclerosis (ICD10-I70.0). Electronically Signed   By: Ozell Daring M.D.   On: 10/10/2023 16:41   DG Chest 2 View Result Date: 10/10/2023 CLINICAL DATA:  Chest pain, shortness of breath EXAM: CHEST - 2 VIEW COMPARISON:  09/13/2021 FINDINGS: Lungs are clear.  No pneumothorax. Heart size normal. Dilated pulmonary artery as before. Aortic Atherosclerosis (ICD10-170.0). No effusion. Visualized bones unremarkable. IMPRESSION: 1. No acute findings. 2. Dilated pulmonary artery suggesting pulmonary arterial hypertension. Electronically Signed   By: JONETTA Faes M.D.   On: 10/10/2023 16:08       Assessment & Plan Kidney lesion Small Renal Lesion A 1 cm lesion in the lower right kidney was incidentally found on a recent CT scan. We recommended further evaluation with a dedicated abdominal MRI and discussed the benefits of MRI for detailed evaluation and potential cancer screening. He expressed severe claustrophobia, so we discussed using an open MRI and completing it before insurance changes on January 29th. We will order an open MRI of the abdomen and pelvis with contrast and complete lab work for kidney function within 30 days if MRI is approved. Other specified disorders of kidney and ureter  Pulmonary hypertension (HCC) A/P: 63yo smoker with CT showing dilated pulmonary arteries concerning for pulmonary hypertension recent CT showing it may be severe. Encouraged smoking cessation. Basic pulmonary and cardiology specialist pending.  Ordering comprehensive workup to evaluate etiology including: - BNP for RV strain assessment - Coags (PT/INR, PTT, d-dimer, protein C/S, AT3, lupus  anticoagulant, Factor V Leiden deferred due to no family history)) to r/o hypercoagulable state given risk of CTEPH - Autoimmune panel (ANA, RF, anti-CCP, Scl70) as CTD-PAH is common cause  - HIV and TFTs as treatable secondary causes - Alpha-1 given smoking history and possible early COPD  Will refer to [facility] specialist once initial labs return. Plan for echocardiogram  and possible RHC for definitive diagnosis.  Diagnosed with pulmonary arterial hypertension, he has an upcoming appointment with a pulmonologist on February 19th. We emphasized the importance of this follow-up. He will follow up with the pulmonologist as scheduled. Polycythemia Lab Results  Component Value Date/Time   HGB 17.5 (H) 10/10/2023 12:18 PM   HGB 16.6 02/16/2023 09:19 AM   HGB 17.6 (H) 01/03/2022 10:51 AM   HGB 14.8 06/24/2021 10:35 PM   HGB 15.4 06/24/2021 04:21 AM   Reviewed, will check jak2 reflex but ? If related with pulmonary hypertension for which we are working up. Systemic infection (HCC) Unusual Recent Severe Systemic Infection, now 90-100% resolved. Following severe systemic symptoms including cough, chest condition, diarrhea, body aches, weakness, shortness of breath, abdominal pain, and weight loss, he showed significant improvement with antibiotic treatment, indicating a bacterial etiology. The exact source, potentially in the hepatobiliary region, remains unidentified as the CT scan was inconclusive. But epigastric pain which was severe resolved with treatment(s). We discussed the risk of recurrence and the importance of monitoring for symptom return. We will continue current medications and monitor for symptom recurrence. Coronary artery disease due to calcified coronary lesion Coronary Atherosclerosis One vessel coronary atherosclerosis was identified on a CT scan. He is currently on daily aspirin  therapy. We discussed the importance of continuing aspirin  to prevent further cardiovascular events. He will  continue daily aspirin .     Orders Placed During this Encounter:   Orders Placed This Encounter  Procedures   MR Abdomen W Wo Contrast    CT abdomen showed 10/10/23   Hepatobiliary: No focal liver abnormality is seen. Status post cholecystectomy. No biliary dilatation.   Pancreas: Unremarkable. No pancreatic ductal dilatation or surrounding inflammatory changes.   Spleen: Normal in size without focal abnormality.   Adrenals/Urinary Tract: There is indeterminate 1 cm hypodensity lower pole right kidney, measuring 7582 reference image 31/2. Otherwise the kidneys enhance normally and symmetrically. No urinary tract calculi or obstructive uropathy. The adrenals and bladder are unremarkable.   Stomach/Bowel: No bowel obstruction or ileus. Normal appendix right lower quadrant. No bowel wall thickening or inflammatory change.   Vascular/Lymphatic: Aortic atherosclerosis. No enlarged abdominal or pelvic lymph nodes.   Reproductive: Prostate is unremarkable. Right hydrocele incidentally noted.   Other: No free fluid or free intraperitoneal gas. No abdominal wall hernia.   Musculoskeletal: No acute or destructive bony abnormalities. Reconstructed images demonstrate no additional findings.   IMPRESSION: 1. No acute intra-abdominal or intrapelvic process. 2. Small right hydrocele partially visualized, of uncertain clinical significance. 3. Indeterminate 1 cm lesion lower pole right kidney. Further evaluation with dedicated abdominal MRI should be considered.    Standing Status:   Future    Expiration Date:   10/18/2024    Scheduling Instructions:     MUST BE OPEN MRI< severe anxiety.  Alternative of sedated MRI with aneesthesia.  Must be done by end of January due to insurance changes then. urgent    If indicated for the ordered procedure, I authorize the administration of contrast media per Radiology protocol:   Yes    What is the patient's sedation requirement?:   Anti-anxiety     Does the patient have a pacemaker or implanted devices?:   No    Preferred imaging location?:   External   CBC with Differential/Platelet   JAK2 V617F rfx CALR/MPL/E12-15   Rheumatoid factor   Cyclic citrul peptide antibody, IgG ([facility])   Anti-Scleroderma Antibody   ANA+ENA+DNA/DS+Scl 70+SjoSSA/B   Alpha-1-antitrypsin  D-Dimer, Quantitative   Cardio IQ NT ProBNP   B Nat Peptide   Antiphospholipid Syndrome Diagnostic Panel-([facility])   Protime-INR    Standing Status:   Future    Expiration Date:   10/18/2024   Protein C activity   Protein C, total   Protein S activity   Protein S, total   Lupus anticoagulant panel( [facility])   Antithrombin III    No orders of the defined types were placed in this encounter.   General Health Maintenance He is up to date on lung cancer screening and does not immediately require a shingles vaccination. We recommended blood work for polycythemia and pulmonary hypertension before insurance changes and discussed the importance of utilizing current insurance for necessary tests. We will order the recommended blood work.  Follow-up We will urgently schedule an open MRI before January 29th, complete blood work today, and call with results.  Further follow up to be decided by results of above   This document was synthesized by artificial intelligence ([employer]bridge) using HIPAA-compliant recording of the clinical interaction;   We discussed the use of AI scribe software for clinical note transcription with the patient, who gave verbal consent to proceed.    Additional Info: This encounter employed state-of-the-art, real-time, collaborative documentation. The patient actively reviewed and assisted in updating their electronic medical record on a shared screen, ensuring transparency and facilitating joint problem-solving for the problem list, overview, and plan. This approach promotes accurate, informed care. The treatment plan was discussed and  reviewed in detail, including medication safety, potential side effects, and all patient questions. We confirmed understanding and comfort with the plan. Follow-up instructions were established, including contacting the office for any concerns, returning if symptoms worsen, persist, or new symptoms develop, and precautions for potential emergency department visits.

## 2023-10-19 NOTE — Assessment & Plan Note (Signed)
 Lab Results  Component Value Date/Time   HGB 17.5 (H) 10/10/2023 12:18 PM   HGB 16.6 02/16/2023 09:19 AM   HGB 17.6 (H) 01/03/2022 10:51 AM   HGB 14.8 06/24/2021 10:35 PM   HGB 15.4 06/24/2021 04:21 AM   Reviewed, will check jak2 reflex but ? If related with pulmonary hypertension for which we are working up.

## 2023-10-19 NOTE — Assessment & Plan Note (Addendum)
 A/P: 63yo smoker with CT showing dilated pulmonary arteries concerning for pulmonary hypertension recent CT showing it may be severe. Encouraged smoking cessation. Basic pulmonary and cardiology specialist pending.  Ordering comprehensive workup to evaluate etiology including: - BNP for RV strain assessment - Coags (PT/INR, PTT, d-dimer, protein C/S, AT3, lupus anticoagulant, Factor V Leiden deferred due to no family history)) to r/o hypercoagulable state given risk of CTEPH - Autoimmune panel (ANA, RF, anti-CCP, Scl70) as CTD-PAH is common cause  - HIV and TFTs as treatable secondary causes - Alpha-1 given smoking history and possible early COPD  Will refer to [facility] specialist once initial labs return. Plan for echocardiogram and possible RHC for definitive diagnosis.  Diagnosed with pulmonary arterial hypertension, he has an upcoming appointment with a pulmonologist on February 19th. We emphasized the importance of this follow-up. He will follow up with the pulmonologist as scheduled.

## 2023-10-19 NOTE — Assessment & Plan Note (Signed)
 Coronary Atherosclerosis One vessel coronary atherosclerosis was identified on a CT scan. He is currently on daily aspirin therapy. We discussed the importance of continuing aspirin to prevent further cardiovascular events. He will continue daily aspirin.

## 2023-10-19 NOTE — Patient Instructions (Signed)
 It was a pleasure seeing you today! Your health and satisfaction are our top priorities.  Bernardino Cone, MD  Your Providers PCP: Cone Bernardino MATSU, MD,  [phone]) Referring Provider: Cone Bernardino MATSU, MD,  [phone]) Care Team Provider: Lavona Agent, MD,  [phone]) Care Team Provider: Cindie Carlin POUR, [place],  [phone]) Care Team Provider: Darlean Ozell NOVAK, MD,  [phone]) Care Team Provider: Shona Rush, MD,  [phone]) Care Team Provider: Ruthell Lauraine FALCON, NP,  [phone])  VISIT SUMMARY:  Robert Yates, you visited us  today for a follow-up after a recent severe illness. You reported significant improvement in your symptoms and a return to your baseline health. We discussed your ongoing health issues and made plans for further evaluation and management.  YOUR PLAN:  -RECENT SEVERE SYSTEMIC INFECTION: You had a severe infection with symptoms like cough, chest congestion, diarrhea, body aches, weakness, shortness of breath, abdominal pain, and weight loss. The antibiotics have helped you recover, but we need to monitor for any return of symptoms. Please continue your current medications and let us  know if any symptoms come back.  -EPIGASTRIC PAIN: You experienced severe stomach pain similar to past gallbladder attacks, but it has now reduced. Since the CT scan showed no issues, we will keep an eye on it and consider further tests if the pain returns or worsens.  -PULMONARY ARTERIAL HYPERTENSION: This condition involves high blood pressure in the arteries of your lungs. You have an appointment with a lung specialist on February 19th, which is important to attend for further management.  -CORONARY ATHEROSCLEROSIS: This is a condition where the arteries in your heart have narrowed. You are on daily aspirin  to prevent heart problems, and it is important to continue this medication.  -SMALL RENAL LESION: A small lesion was found in your right kidney. We need to evaluate it  further with an open MRI before your insurance changes on January 29th. This will help us  get a detailed look and screen for any potential issues.  -GENERAL HEALTH MAINTENANCE: You are up to date on lung cancer screening and do not need a shingles vaccine right now. We will do blood work for polycythemia and pulmonary hypertension before your insurance changes. It is important to use your current insurance for these tests.  INSTRUCTIONS:  We will urgently schedule an open MRI before January 29th and complete blood work today. We will call you with the results.   NEXT STEPS: [x]  Early Intervention: Schedule sooner appointment, call our on-call services, or go to emergency room if there is any significant Increase in pain or discomfort New or worsening symptoms Sudden or severe changes in your health [x]  Flexible Follow-Up: We recommend a No follow-ups on file. for optimal routine care. This allows for progress monitoring and treatment adjustments. [x]  Preventive Care: Schedule your annual preventive care visit! It's typically covered by insurance and helps identify potential health issues early. [x]  Lab & X-ray Appointments: Incomplete tests scheduled today, or call to schedule. X-rays: [facility] (M-F, 8:30am-noon or 1pm-5pm). [x]  Medical Information Release: Sign a release form at front desk to obtain relevant medical information we don't have.  MAKING THE MOST OF OUR FOCUSED 20 MINUTE APPOINTMENTS: [x]   Clearly state your top concerns at the beginning of the visit to focus our discussion [x]   If you anticipate you will need more time, please inform the front desk during scheduling - we can book multiple appointments in the same week. [x]   If you have transportation problems- use  our convenient video appointments or ask about transportation support. [x]   We can get down to business faster if you use MyChart to update information before the visit and submit non-urgent  questions before your visit. Thank you for taking the time to provide details through MyChart.  Let our nurse know and she can import this information into your encounter documents.  Arrival and Wait Times: [x]   Arriving on time ensures that everyone receives prompt attention. [x]   Early morning (8a) and afternoon (1p) appointments tend to have shortest wait times. [x]   Unfortunately, we cannot delay appointments for late arrivals or hold slots during phone calls.  Getting Answers and Following Up [x]   Simple Questions & Concerns: For quick questions or basic follow-up after your visit, reach us  at [phone] or MyChart messaging. [x]   Complex Concerns: If your concern is more complex, scheduling an appointment might be best. Discuss this with the staff to find the most suitable option. [x]   Lab & Imaging Results: We'll contact you directly if results are abnormal or you don't use MyChart. Most normal results will be on MyChart within 2-3 business days, with a review message from Dr. Jesus. Haven't heard back in 2 weeks? Need results sooner? Contact us  at [phone]. [x]   Referrals: Our referral coordinator will manage specialist referrals. The specialist's office should contact you within 2 weeks to schedule an appointment. Call us  if you haven't heard from them after 2 weeks.  Staying Connected [x]   MyChart: Activate your MyChart for the fastest way to access results and message us . See the last page of this paperwork for instructions on how to activate.  Bring to Your Next Appointment [x]   Medications: Please bring all your medication bottles to your next appointment to ensure we have an accurate record of your prescriptions. [x]   Health Diaries: If you're monitoring any health conditions at home, keeping a diary of your readings can be very helpful for discussions at your next appointment.  Billing [x]   X-ray & Lab Orders: These are billed by separate companies. Contact the  invoicing company directly for questions or concerns. [x]   Visit Charges: Discuss any billing inquiries with our administrative services team.  Your Satisfaction Matters [x]   Share Your Experience: We strive for your satisfaction! If you have any complaints, or preferably compliments, please let Dr. Jesus know directly or contact our Practice Administrators, Manuelita Rubin or [employer], by asking at the front desk.   Reviewing Your Records [x]   Review this early draft of your clinical encounter notes below and the final encounter summary tomorrow on MyChart after its been completed.  All orders placed so far are visible here: Kidney lesion  Other specified disorders of kidney and ureter -     MR ABDOMEN W WO CONTRAST; Future  Pulmonary hypertension (HCC) Assessment & Plan: A/P: 63yo smoker with CT showing dilated pulmonary arteries concerning for pulmonary hypertension recent CT showing it may be severe. Encouraged smoking cessation. Basic pulmonary and cardiology specialist pending.  Ordering comprehensive workup to evaluate etiology including: - BNP for RV strain assessment - Coags (PT/INR, PTT, d-dimer, protein C/S, AT3, lupus anticoagulant, Factor V Leiden deferred due to no family history)) to r/o hypercoagulable state given risk of CTEPH - Autoimmune panel (ANA, RF, anti-CCP, Scl70) as CTD-PAH is common cause  - HIV and TFTs as treatable secondary causes - Alpha-1 given smoking history and possible early COPD  Will refer to [facility] specialist once initial labs return. Plan for echocardiogram and  possible RHC for definitive diagnosis.  Orders: -     CBC with Differential/Platelet -     Rheumatoid factor -     Cyclic citrul peptide antibody, IgG -     Anti-scleroderma antibody -     ANA+ENA+DNA/DS+Scl 70+SjoSSA/B -     Alpha-1-antitrypsin -     D-dimer, quantitative -     Cardio IQ NT ProBNP -     Brain natriuretic peptide -     Antiphospholipid Syndrome Diagnostic Panel -      Protime-INR; Future -     Protein C activity -     Protein C, total -     Protein S activity -     Protein S, total -     Lupus anticoagulant panel -     Antithrombin III   Polycythemia -     JAK2 V617F rfx CALR/MPL/E12-15  Systemic infection (HCC)  Coronary artery disease due to calcified coronary lesion

## 2023-10-23 ENCOUNTER — Encounter: Payer: Self-pay | Admitting: Internal Medicine

## 2023-10-23 LAB — JAK2 V617F RFX CALR/MPL/E12-15

## 2023-10-23 LAB — ANTIPHOSPHOLIPID SYNDROME DIAGNOSTIC PANEL
Anticardiolipin IgA: 2.4 [APL'U]/mL (ref <20.0)
Anticardiolipin IgG: <2 [GPL'U]/mL (ref <20.0)
Anticardiolipin IgM: 8.9 [MPL'U]/mL (ref <20.0)
Beta-2 Glyco 1 IgA: 2.4 U/mL (ref <20.0)
Beta-2 Glyco 1 IgM: 8.2 U/mL (ref <20.0)
Beta-2 Glyco I IgG: 3.1 U/mL (ref <20.0)
PTT-LA Screen: 40 s (ref <=40)
dRVVT: 33 s (ref <=45)

## 2023-10-23 LAB — CYCLIC CITRUL PEPTIDE ANTIBODY, IGG: Cyclic Citrullin Peptide Ab: <16 U

## 2023-10-23 LAB — CARDIO IQ® NT PROBNP: NT PROBNP: 292 pg/mL — ABNORMAL HIGH (ref <125)

## 2023-10-24 ENCOUNTER — Telehealth: Payer: Self-pay | Admitting: Internal Medicine

## 2023-10-24 NOTE — Telephone Encounter (Signed)
 Copied from CRM [id number]. Topic: Clinical - Lab/Test Results >> Oct 24, 2023 11:46 AM Leila C wrote: Reason for CRM: Patient is also waiting for Dr. Jesus to reply to patient's questions in MyChart which would clarify the care for patient. Patient's wife Randine is upset and wants Dr. Alena to call patient to discuss, please call back at [phone].   Messaged patient back and responded to their my chart message. Informed her that he has not yet been able to review the most recent labs. Forwarding this message to Dr. Jesus.

## 2023-10-24 NOTE — Telephone Encounter (Signed)
 Copied from CRM [id number]. Topic: Referral - Status >> Oct 24, 2023 11:45 AM Leila C wrote: Reason for CRM: Patient's spouse Randine [phone] states patient is very sick and is waiting for scheduling for MRI. Patient's insurance is changing the end of this month and needs to get in for the MRI.

## 2023-10-25 ENCOUNTER — Other Ambulatory Visit (HOSPITAL_COMMUNITY): Payer: Self-pay

## 2023-10-25 ENCOUNTER — Other Ambulatory Visit: Payer: Self-pay | Admitting: Internal Medicine

## 2023-10-25 ENCOUNTER — Telehealth: Payer: Self-pay

## 2023-10-25 NOTE — Telephone Encounter (Signed)
 Pharmacy Patient Advocate Encounter   Received notification from CoverMyMeds that prior authorization for Wixela Inhub 250-50MCG/ACT aerosol powder  is required/requested.   Insurance verification completed.   The patient is insured through [facility] .   Per test claim:  Brand Advair Diskus is preferred by the insurance.  If suggested medication is appropriate, Please send in a new RX and discontinue this one. If not, please advise as to why it's not appropriate so that we may request a Prior Authorization. Please note, some preferred medications may still require a PA

## 2023-10-26 ENCOUNTER — Encounter: Payer: Self-pay | Admitting: Internal Medicine

## 2023-10-26 ENCOUNTER — Ambulatory Visit: Payer: Medicare Other | Attending: Physician Assistant | Admitting: Physician Assistant

## 2023-10-26 VITALS — BP 106/56 | HR 90 | Ht 74.0 in | Wt 161.4 lb

## 2023-10-26 DIAGNOSIS — I2510 Atherosclerotic heart disease of native coronary artery without angina pectoris: Secondary | ICD-10-CM | POA: Insufficient documentation

## 2023-10-26 DIAGNOSIS — N289 Disorder of kidney and ureter, unspecified: Secondary | ICD-10-CM | POA: Insufficient documentation

## 2023-10-26 DIAGNOSIS — R7989 Other specified abnormal findings of blood chemistry: Secondary | ICD-10-CM | POA: Insufficient documentation

## 2023-10-26 DIAGNOSIS — I2584 Coronary atherosclerosis due to calcified coronary lesion: Secondary | ICD-10-CM | POA: Diagnosis present

## 2023-10-26 DIAGNOSIS — I272 Pulmonary hypertension, unspecified: Secondary | ICD-10-CM | POA: Insufficient documentation

## 2023-10-26 LAB — D-DIMER, QUANTITATIVE: D-DIMER: 0.24 mg{FEU}/L (ref 0.00–0.49)

## 2023-10-26 LAB — PROTEIN S, TOTAL: Protein S Ag, Total: 82 % (ref 60–150)

## 2023-10-26 LAB — ANA+ENA+DNA/DS+SCL 70+SJOSSA/B
ENA RNP Ab: <0.2 AI (ref 0.0–0.9)
ENA SM Ab Ser-aCnc: <0.2 AI (ref 0.0–0.9)
ENA SSA (RO) Ab: <0.2 AI (ref 0.0–0.9)
ENA SSB (LA) Ab: <0.2 AI (ref 0.0–0.9)
Scleroderma (Scl-70) (ENA) Antibody, IgG: <0.2 AI (ref 0.0–0.9)
dsDNA Ab: <1 [IU]/mL (ref 0–9)

## 2023-10-26 LAB — PROTEIN S ACTIVITY: Protein S Activity: 100 % (ref 63–140)

## 2023-10-26 LAB — PTT-LA MIX: PTT-LA Mix: 41.2 s — ABNORMAL HIGH (ref 0.0–40.5)

## 2023-10-26 LAB — PROTEIN C ACTIVITY: Protein C Activity: 138 % (ref 73–180)

## 2023-10-26 LAB — JAK2 V617F RFX CALR/MPL/E12-15

## 2023-10-26 LAB — CBC WITH DIFFERENTIAL/PLATELET
Basophils Absolute: 0.1 10*3/uL (ref 0.0–0.2)
Basos: 1 %
EOS (ABSOLUTE): 0.1 10*3/uL (ref 0.0–0.4)
Eos: 2 %
Hematocrit: 52.2 % — ABNORMAL HIGH (ref 37.5–51.0)
Hemoglobin: 17.2 g/dL (ref 13.0–17.7)
Immature Grans (Abs): 0 10*3/uL (ref 0.0–0.1)
Immature Granulocytes: 0 %
Lymphocytes Absolute: 1.9 10*3/uL (ref 0.7–3.1)
Lymphs: 32 %
MCH: 30.3 pg (ref 26.6–33.0)
MCHC: 33 g/dL (ref 31.5–35.7)
MCV: 92 fL (ref 79–97)
Monocytes Absolute: 0.6 10*3/uL (ref 0.1–0.9)
Monocytes: 10 %
Neutrophils Absolute: 3.4 10*3/uL (ref 1.4–7.0)
Neutrophils: 55 %
Platelets: 380 10*3/uL (ref 150–450)
RBC: 5.68 x10E6/uL (ref 4.14–5.80)
RDW: 12.9 % (ref 11.6–15.4)
WBC: 6 10*3/uL (ref 3.4–10.8)

## 2023-10-26 LAB — LUPUS ANTICOAGULANT PANEL
Dilute Viper Venom Time: 39.3 s (ref 0.0–47.0)
PTT Lupus Anticoagulant: 46.5 s — ABNORMAL HIGH (ref 0.0–43.5)

## 2023-10-26 LAB — CALR +MPL + E12-E15  (REFLEX)

## 2023-10-26 LAB — ALPHA-1-ANTITRYPSIN: A-1 Antitrypsin: 172 mg/dL (ref 101–187)

## 2023-10-26 LAB — BRAIN NATRIURETIC PEPTIDE: BNP: 77.3 pg/mL (ref 0.0–100.0)

## 2023-10-26 LAB — ANTITHROMBIN III: AntiThromb III Func: 126 % (ref 75–135)

## 2023-10-26 LAB — PROTEIN C, TOTAL: Protein C Antigen: 113 % (ref 60–150)

## 2023-10-26 LAB — RHEUMATOID FACTOR: Rheumatoid fact SerPl-aCnc: <10 [IU]/mL (ref <14.0)

## 2023-10-26 LAB — HEXAGONAL PHASE PHOSPHOLIPID: Hexagonal Phase Phospholipid: 6 s (ref 0–11)

## 2023-10-26 NOTE — Patient Instructions (Signed)
Medication Instructions:  NO CHANGES *If you need a refill on your cardiac medications before your next appointment, please call your pharmacy*   Lab Work: NO LABS If you have labs (blood work) drawn today and your tests are completely normal, you will receive your results only by: MyChart Message (if you have MyChart) OR A paper copy in the mail If you have any lab test that is abnormal or we need to change your treatment, we will call you to review the results.   Testing/Procedures:[address]- PRIOR TO 28TH OF THIS MONTH Your physician has requested that you have an echocardiogram. Echocardiography is a painless test that uses sound waves to create images of your heart. It provides your doctor with information about the size and shape of your heart and how well your heart's chambers and valves are working. This procedure takes approximately one hour. There are no restrictions for this procedure. Please do NOT wear cologne, perfume, aftershave, or lotions (deodorant is allowed). Please arrive 15 minutes prior to your appointment time.  Please note: We ask at that you not bring children with you during ultrasound (echo/ vascular) testing. Due to room size and safety concerns, children are not allowed in the ultrasound rooms during exams. Our front office staff cannot provide observation of children in our lobby area while testing is being conducted. An adult accompanying a patient to their appointment will only be allowed in the ultrasound room at the discretion of the ultrasound technician under special circumstances. We apologize for any inconvenience.    Follow-Up: At [facility], you and your health needs are our priority.  As part of our continuing mission to provide you with exceptional heart care, we have created designated Provider Care Teams.  These Care Teams include your primary Cardiologist (physician) and Advanced Practice Providers (APPs -  Physician  Assistants and Nurse Practitioners) who all work together to provide you with the care you need, when you need it.   Your next appointment:   6 week(s) after echocardiogram  Provider:   Azalee Course, PA    Other Instructions

## 2023-10-26 NOTE — Progress Notes (Signed)
Cardiology Office Note:  .   Date:  10/26/2023  ID:  Robert Yates, DOB 09-20-1961, MRN [id number] PCP: Robert Olszewski, MD  [facility] Providers Cardiologist:  Rollene Rotunda, MD     History of Present Illness: .   Robert Yates is a 63 y.o. male with past medical history of anxiety, COPD, tobacco abuse, hyperlipidemia, and history of coronary artery calcium seen on previous CT scan.  Echocardiogram obtained on 06/12/2020 showed EF 55 to 60%, no regional wall motion abnormality, normal valve.  Patient was initially referred to cardiology service due to coronary calcification on lung cancer screening CT.  He was last seen by Dr. Antoine Poche on 11/15/2022, given lack of chest pain, a plain old treadmill test was recommended.  Plain old treadmill test obtained on 11/24/2022 showed no ischemic changes, patient was able to reach a peak METS level of 4.6.  Patient was recently seen by PCP on 11/08/2023 at which time he just recovered from a viral illness.  He previously had cough, chest congestion, diarrhea, body ache and weakness.  He reported 10 pound weight gain since previous visit.  Additional blood work was obtained.  CBC showed normal hemoglobin and white blood cell count.  NT proBNP was elevated at 292 (previously pro BNP 17 on 10/10/2023).  BNP normal at 77.3.  D-dimer negative.   Patient presents today for evaluation of abnormal NT proBNP.  He says he got sick around December 14 until 2024, he struggled for 2 weeks before he saw his PCP near the end of December who prescribed him cefdinir.  He had a lot of chest congestion, runny nose, abdominal discomfort, weakness and general malaise.  Shortly after starting on antibiotic, his symptoms got better.  proBNP obtained in December was normal.  Chest x-ray showed no acute finding, dilated pulmonary artery suggestive of pulmonary arterial hypertension.  CT of abdomen pelvis with contrast obtained on 10/10/2023 showed no acute finding, small right  hydrocele, indeterminate 1 cm lesion in the lower pole of the right kidney, further evaluation via abdominal MRI was recommended.  His viral symptom has largely resolved at this time.  He denies any significant chest pain or shortness of breath.  On physical exam, he has no lower extremity edema, orthopnea or PND.  My suspicion is elevated proBNP may be related to recent viral infection.  Especially in light of normal BNP on the same date.  He is concerned about the evidence of dilated pulmonary artery seen on the recent chest x-ray.  Will proceed with echocardiogram to further evaluate elevated proBNP and dilated pulmonary artery.  I plan to see the patient back in 6 weeks.  ROS:   He denies chest pain, palpitations, dyspnea, pnd, orthopnea, n, v, dizziness, syncope, edema, weight gain, or early satiety. All other systems reviewed and are otherwise negative except as noted above.    Studies Reviewed: .       Echo 06/12/2020 1. Left ventricular ejection fraction, by estimation, is 55 to 60%. The  left ventricle has normal function. The left ventricle has no regional  wall motion abnormalities. Left ventricular diastolic parameters are  consistent with Grade I diastolic  dysfunction (impaired relaxation).   2. Right ventricular systolic function is normal. The right ventricular  size is normal.   3. The mitral valve is normal in structure. No evidence of mitral valve  regurgitation. No evidence of mitral stenosis.   4. The aortic valve has an indeterminant number of cusps. Aortic  valve  regurgitation is not visualized. No aortic stenosis is present.   5. The inferior vena cava is normal in size with greater than 50%  respiratory variability, suggesting right atrial pressure of 3 mmHg.     ETT 11/24/2022   Baseline ECG is normal. ECG rhythm shows normal sinus rhythm. Resting ECG shows no ST-segment deviation. The ECG shows premature ventricular contractions.   Exercise capacity was severely  impaired. Stage 1 was reached after exercising for 3 min and 0 sec. Maximum HR of 144 bpm. MPHR 92.0 %. Peak METS 4.6 . The patient experienced no angina during the test. The patient achieved the target heart rate. The patient requested the test to be stopped. Hypertensive blood pressure and normal heart rate response noted during stress. Heart rate recovery was normal.   No ST deviation was noted. Arrhythmias during stress: occasional PVCs. Arrhythmias during recovery: occasional PVCs. ECG was interpretable and conclusive. The ECG was negative for ischemia.  Risk Assessment/Calculations:             Physical Exam:   VS:  BP (!) 106/56 (BP Location: Left Arm, Patient Position: Sitting, Cuff Size: Normal)   Pulse 90   Ht 6\' 2"  (1.88 m)   Wt 161 lb 6.4 oz (73.2 kg)   SpO2 98%   BMI 20.72 kg/m    Wt Readings from Last 3 Encounters:  10/26/23 161 lb 6.4 oz (73.2 kg)  10/19/23 163 lb 4.8 oz (74.1 kg)  10/10/23 161 lb 9.6 oz (73.3 kg)    GEN: Well nourished, well developed in no acute distress NECK: No JVD; No carotid bruits CARDIAC: RRR, no murmurs, rubs, gallops RESPIRATORY:  Clear to auscultation without rales, wheezing or rhonchi  ABDOMEN: Soft, non-tender, non-distended EXTREMITIES:  No edema; No deformity   ASSESSMENT AND PLAN: .     Elevated Anti-proBNP Anti-proBNP elevated at 292, possibly related to recent viral illness. No signs of volume overload on physical examination. BNP obtained on the same day was normal -Order echocardiogram to assess cardiac function and confirm absence of volume overload. -Return for follow-up in 6 weeks or earlier if echocardiogram results are severely abnormal.  Renal Lesion 1 cm lesion in the lower pole of the right kidney identified on abdominal CT. -Pending abdominal MRI for further evaluation.  Pulmonary Hypertension History of pulmonary hypertension with dilated pulmonary artery noted on previous imaging. -Echocardiogram will provide  further information on right chamber pressure.  Coronary artery calcification: ETT in February 2024 was normal.  Patient denies any obvious exertional chest pain.       Dispo: Follow-up in 6 weeks, earlier if echocardiogram came back abnormal.  Signed, Azalee Course, PA

## 2023-10-30 ENCOUNTER — Encounter: Payer: Self-pay | Admitting: Internal Medicine

## 2023-10-31 ENCOUNTER — Ambulatory Visit: Payer: Medicare Other | Attending: Physician Assistant

## 2023-10-31 DIAGNOSIS — I272 Pulmonary hypertension, unspecified: Secondary | ICD-10-CM | POA: Insufficient documentation

## 2023-10-31 LAB — ECHOCARDIOGRAM COMPLETE
AV Mean grad: 2 mm[Hg]
AV Peak grad: 4.1 mm[Hg]
Ao pk vel: 1.01 m/s
Area-P 1/2: 4.15 cm2
Calc EF: 32.7 %
S' Lateral: 3.9 cm
Single Plane A2C EF: 36.3 %
Single Plane A4C EF: 30.1 %

## 2023-11-01 NOTE — Telephone Encounter (Signed)
Patient's review of lab results/notes confirmed. Left vm for patient to call and let us know if he has had the MRI done yet. Looks as if he hasn't but I'm not sure.

## 2023-11-01 NOTE — Telephone Encounter (Signed)
Patient's review of lab results/notes confirmed.

## 2023-11-02 MED ORDER — FLUTICASONE-SALMETEROL 250-50 MCG/ACT IN AEPB: 1.0000 | INHALATION_SPRAY | Freq: Two times a day (BID) | RESPIRATORY_TRACT | 0 refills | Status: DC

## 2023-11-02 NOTE — Telephone Encounter (Signed)
Dr.Wert can we send in a courtesy refill of advair until pt has a OV with you

## 2023-11-14 ENCOUNTER — Ambulatory Visit: Payer: Medicare Other | Admitting: Physician Assistant

## 2023-11-16 NOTE — Progress Notes (Signed)
Cardiology Office Note:  .   Date:  11/24/2023  ID:  Robert Yates, DOB 05-10-1961, MRN [id number] PCP: Lula Olszewski, MD  [facility] Providers Cardiologist:  Rollene Rotunda, MD   History of Present Illness: .   Robert Yates is a 63 y.o. male with a past medical history of anxiety, COPD, tobacco use, HLD, CAD noted on previous CT scan. Patient is followed by Dr. Antoine Poche and presents for followup after a recent echocardiogram   Patient previously underwent echocardiogram in 06/2020 that showed EF 55-60%, no regional wall motion abnormalities, no significant valvular abnormalities. Patient was later referred to cardiology after he was found to have coronary calcification on a lung cancer screening CT. He underwent POET in 11/2022 that showed no ischemic changes and patient was able to reach a peak METS level of 4.6.   Patient had been seen by his PCP on 10/19/23 after he had recovered from a viral illness. He previously had cough, chest congestion, diarrhea, body aches, and weakness. He told his PCP that he had gained 10 lbs. PCP colleted a NT proBNP that was elevated to 292. He was referred to cardiology and was seen 1/16. At that time, patient denied shortness of breath, chest pain, orthopnea. He was euvolemic on exam. He had recently had a chest x-ray that showed a dilated pulmonary artery suggestive of pulmonary arterial hypertension. He underwent echocardiogram on 10/31/23 that showed EF 40-45%, grade I DD, normal RV function, mild MR.   Today, patient presents to discuss his recent echocardiogram results.  Reports that he has been feeling well from a cardiac perspective and has recovered well from his recent viral illness.  He denies worsening shortness of breath, orthopnea, ankle edema.  Does admit that he is chronically a bit short of breath due to his COPD history, but his breathing has been stable.  He denies chest pain.  He does have occasional episodes of vague upper abdominal  discomfort that occurs randomly.  Feels a bit like pressure.  Occurs mostly when he is at rest and only last for few seconds at a time.  He admits to continuing to use cigarettes but has cut back significantly over the past few years.  At one point was smoking 2 packs/day, now smokes about 3/4 pack a day.  He denies dizziness, syncope, near syncope  ROS: Denies chest pain, worsening shortness of breath, ankle edema, syncope, near syncope, dizziness, palpitations.   Studies Reviewed: .   Cardiac Studies & Procedures   ______________________________________________________________________________________________   STRESS TESTS  EXERCISE TOLERANCE TEST (ETT) 11/24/2022  Narrative   Baseline ECG is normal. ECG rhythm shows normal sinus rhythm. Resting ECG shows no ST-segment deviation. The ECG shows premature ventricular contractions.   Exercise capacity was severely impaired. Stage 1 was reached after exercising for 3 min and 0 sec. Maximum HR of 144 bpm. MPHR 92.0 %. Peak METS 4.6 . The patient experienced no angina during the test. The patient achieved the target heart rate. The patient requested the test to be stopped. Hypertensive blood pressure and normal heart rate response noted during stress. Heart rate recovery was normal.   No ST deviation was noted. Arrhythmias during stress: occasional PVCs. Arrhythmias during recovery: occasional PVCs. ECG was interpretable and conclusive. The ECG was negative for ischemia.   ECHOCARDIOGRAM  ECHOCARDIOGRAM COMPLETE 10/31/2023  Narrative ECHOCARDIOGRAM REPORT    Patient Name:   Robert Yates [facility] Date of Exam: 10/31/2023 Medical Rec #:  [id number]  Height:       74.0 in Accession #:    [id number]     Weight:       161.4 lb Date of Birth:  [birth year]      BSA:          1.984 m Patient Age:    63 years       BP:           106/56 mmHg Patient Gender: M              HR:           97 bpm. Exam Location:  [place]  Procedure: 3D Echo, 2D Echo,  Cardiac Doppler, Color Doppler and Strain Analysis  Indications:    I25.110 Atherosclerotic heart disease of native coronary artery with unstable angina pectoris  History:        Patient has prior history of Echocardiogram examinations, most recent 06/12/2020. CAD, COPD; Risk Factors:Current Smoker and Dyslipidemia.  Sonographer:    Ilda Mori MHA, BS, RDCS Referring Phys: [phone] HAO MENG   Sonographer Comments: Image acquisition challenging due to COPD. IMPRESSIONS   1. Left ventricular ejection fraction, by estimation, is 40 to 45%. Left ventricular ejection fraction by 3D volume is 47 %. The left ventricle has mildly decreased function. The left ventricle demonstrates global hypokinesis. Left ventricular diastolic parameters are consistent with Grade I diastolic dysfunction (impaired relaxation). The average left ventricular global longitudinal strain is -13.0 %. 2. Right ventricular systolic function is normal. The right ventricular size is normal. Tricuspid regurgitation signal is inadequate for assessing PA pressure. 3. The mitral valve is normal in structure. Mild mitral valve regurgitation. No evidence of mitral stenosis. 4. The aortic valve has an indeterminant number of cusps. Aortic valve regurgitation is not visualized. No aortic stenosis is present. 5. The inferior vena cava is normal in size with greater than 50% respiratory variability, suggesting right atrial pressure of 3 mmHg.  FINDINGS Left Ventricle: Left ventricular ejection fraction, by estimation, is 40 to 45%. Left ventricular ejection fraction by 3D volume is 47 %. The left ventricle has mildly decreased function. The left ventricle demonstrates global hypokinesis. The average left ventricular global longitudinal strain is -13.0 %. The left ventricular internal cavity size was normal in size. There is no left ventricular hypertrophy. Left ventricular diastolic parameters are consistent with Grade I diastolic  dysfunction (impaired relaxation).  Right Ventricle: The right ventricular size is normal. No increase in right ventricular wall thickness. Right ventricular systolic function is normal. Tricuspid regurgitation signal is inadequate for assessing PA pressure.  Left Atrium: Left atrial size was normal in size.  Right Atrium: Right atrial size was normal in size.  Pericardium: There is no evidence of pericardial effusion.  Mitral Valve: The mitral valve is normal in structure. Mild mitral valve regurgitation. No evidence of mitral valve stenosis.  Tricuspid Valve: The tricuspid valve is normal in structure. Tricuspid valve regurgitation is not demonstrated. No evidence of tricuspid stenosis.  Aortic Valve: The aortic valve has an indeterminant number of cusps. Aortic valve regurgitation is not visualized. No aortic stenosis is present. Aortic valve mean gradient measures 2.0 mmHg. Aortic valve peak gradient measures 4.1 mmHg.  Pulmonic Valve: The pulmonic valve was normal in structure. Pulmonic valve regurgitation is mild. No evidence of pulmonic stenosis.  Aorta: The aortic root is normal in size and structure.  Venous: The inferior vena cava is normal in size with greater than 50% respiratory variability, suggesting right atrial pressure of  3 mmHg.  IAS/Shunts: No atrial level shunt detected by color flow Doppler.   LEFT VENTRICLE PLAX 2D LVIDd:         4.30 cm         Diastology LVIDs:         3.90 cm         LV e' medial:    8.27 cm/s LV PW:         0.80 cm         LV E/e' medial:  4.6 LV IVS:        0.80 cm         LV e' lateral:   9.57 cm/s LV E/e' lateral: 4.0  LV Volumes (MOD)               2D LV vol d, MOD    104.0 ml      Longitudinal A2C:                           Strain LV vol d, MOD    106.0 ml      2D Strain GLS  -13.0 % A4C:                           Avg: LV vol s, MOD    66.2 ml A2C:                           3D Volume EF LV vol s, MOD    74.1 ml       LV 3D EF:     Left A4C:                                        ventricul LV SV MOD A2C:   37.8 ml                    ar LV SV MOD A4C:   106.0 ml                   ejection LV SV MOD BP:    34.3 ml                    fraction by 3D volume is 47 %.  3D Volume EF: 3D EF:        47 % LV EDV:       132 ml LV ESV:       70 ml LV SV:        62 ml  RIGHT VENTRICLE RV Basal diam:  3.70 cm RV S prime:     10.40 cm/s TAPSE (M-mode): 1.9 cm  LEFT ATRIUM             Index        RIGHT ATRIUM           Index LA diam:        2.70 cm 1.36 cm/m   RA Area:     15.90 cm LA Vol (A2C):   50.0 ml 25.20 ml/m  RA Volume:   43.40 ml  21.87 ml/m LA Vol (A4C):   27.5 ml 13.86 ml/m LA Biplane Vol: 37.1 ml 18.70 ml/m AORTIC VALVE AV Vmax:  101.00 cm/s AV Vmean:          68.200 cm/s AV VTI:            0.186 m AV Peak Grad:      4.1 mmHg AV Mean Grad:      2.0 mmHg LVOT Vmax:         83.80 cm/s LVOT Vmean:        51.900 cm/s LVOT VTI:          0.144 m LVOT/AV VTI ratio: 0.77  AORTA Ao Root diam: 3.70 cm  MITRAL VALVE MV Area (PHT): 4.15 cm    SHUNTS MV Decel Time: 183 msec    Systemic VTI: 0.14 m MV E velocity: 38.30 cm/s MV A velocity: 73.90 cm/s MV E/A ratio:  0.52  Julien Nordmann MD Electronically signed by Julien Nordmann MD Signature Date/Time: 10/31/2023/12:30:07 PM    Final          ______________________________________________________________________________________________      Risk Assessment/Calculations:             Physical Exam:   VS:  BP (!) 120/52 (BP Location: Right Arm, Patient Position: Sitting, Cuff Size: Normal)   Pulse 81   Ht 6\' 2"  (1.88 m)   Wt 165 lb 12.8 oz (75.2 kg)   SpO2 96%   BMI 21.29 kg/m    Wt Readings from Last 3 Encounters:  11/24/23 165 lb 12.8 oz (75.2 kg)  10/26/23 161 lb 6.4 oz (73.2 kg)  10/19/23 163 lb 4.8 oz (74.1 kg)    GEN: Well nourished, well developed in no acute distress. Sitting comfortably in the chair  NECK: No  JVD CARDIAC: RRR, no murmurs, rubs, gallops. Radial pulses 2+ bilaterally  RESPIRATORY:  Clear to auscultation without rales, wheezing or rhonchi. Normal WOB on room air  ABDOMEN: Soft, non-tender, non-distended EXTREMITIES:  No edema in BLE; No deformity   ASSESSMENT AND PLAN: .    Chronic HFmrEF  - Echocardiogram from 10/31/23 showed EF 40-45% with global hypokinesis, grade I DD, normal RV function, mild MR  - Etiology of reduced EF unclear.  - Possibly stress related with his recent viral illness? Patient has long history of tobacco use and continues to smoke.  He also had coronary calcifications on CT scan in 09/2022.  With reduced EF, recommended he undergo coronary CTA to rule out ischemia - Patient is euvolemic on exam today.  He denies worsening shortness of breath.  Denies orthopnea, ankle edema.  No need for diuretic therapy at this time - Continue metoprolol tartrate 12.5 mg BID  - Start losartan 12.5 mg daily. Considered entresto, but it appears that his blood pressure tends to run on the lower side of normal.  - Ordered BMP today and in 2-3 weeks after starting losartan   Coronary Calcifications on CT scan  - Patient had a CT chest for lung cancer screening in 09/2022 that showed coronary calcifications  - Exercise tolerance test from 11/2022 showed no evidence of ischemia  - Patient denies exertional chest pain. Does develop mild pressure in his upper abdominal area randomly, episodes usually only last a few seconds at a time.  Very atypical for cardiac source - With newly reduced EF and risk factors for CAD, recommended coronary CTA as above to assess for ischemic cardiomyopathy - Continue ASA 81 mg daily, crestor 40 mg daily  HLD  - Lipid panel from 09/2023 showed LDL 51, HDL 39, total cholesterol 161, triglycerides 144 - Continue crestor 40  mg daily, aspirin 81 mg daily   Renal Lesion - 1 cm lesion in the lower pole of the right kidney identified on abdominal CT on  10/10/23  -Pending abdominal MRI for further evaluation. This has been ordered by PCP  Tobacco Use  -Patient has cut back significantly on his cigarette use over the years.  At one point was smoking 2 packs/day.  He is now down to 3/4 pack per day  -Encourage patient to continue his efforts to quit smoking  Dispo: Follow-up in 4 months with Dr. Antoine Poche  Signed, Jonita Albee, PA-C

## 2023-11-24 ENCOUNTER — Ambulatory Visit: Payer: HMO | Attending: Cardiology | Admitting: Cardiology

## 2023-11-24 ENCOUNTER — Encounter: Payer: Self-pay | Admitting: Cardiology

## 2023-11-24 VITALS — BP 120/52 | HR 81 | Ht 74.0 in | Wt 165.8 lb

## 2023-11-24 DIAGNOSIS — I2510 Atherosclerotic heart disease of native coronary artery without angina pectoris: Secondary | ICD-10-CM

## 2023-11-24 DIAGNOSIS — E785 Hyperlipidemia, unspecified: Secondary | ICD-10-CM

## 2023-11-24 DIAGNOSIS — I5022 Chronic systolic (congestive) heart failure: Secondary | ICD-10-CM | POA: Diagnosis not present

## 2023-11-24 DIAGNOSIS — N289 Disorder of kidney and ureter, unspecified: Secondary | ICD-10-CM

## 2023-11-24 DIAGNOSIS — I5023 Acute on chronic systolic (congestive) heart failure: Secondary | ICD-10-CM | POA: Diagnosis not present

## 2023-11-24 DIAGNOSIS — Z720 Tobacco use: Secondary | ICD-10-CM

## 2023-11-24 LAB — BASIC METABOLIC PANEL
BUN/Creatinine Ratio: 12 (ref 10–24)
BUN: 11 mg/dL (ref 8–27)
CO2: 25 mmol/L (ref 20–29)
Calcium: 10.1 mg/dL (ref 8.6–10.2)
Chloride: 103 mmol/L (ref 96–106)
Creatinine, Ser: 0.93 mg/dL (ref 0.76–1.27)
Glucose: 69 mg/dL — ABNORMAL LOW (ref 70–99)
Potassium: 4.4 mmol/L (ref 3.5–5.2)
Sodium: 141 mmol/L (ref 134–144)
eGFR: 91 mL/min/{1.73_m2} (ref >59)

## 2023-11-24 MED ORDER — LOSARTAN POTASSIUM 25 MG PO TABS: 12.5000 mg | ORAL_TABLET | Freq: Every day | ORAL | 3 refills | Status: DC

## 2023-11-24 MED ORDER — METOPROLOL TARTRATE 100 MG PO TABS: 100.0000 mg | ORAL_TABLET | Freq: Once | ORAL | 0 refills | Status: DC

## 2023-11-24 NOTE — Patient Instructions (Addendum)
Medication Instructions:  Start losartan 12.5 mg once a day Take Metoprolol tartrate 100 mg once 1 hour prior to scan *If you need a refill on your cardiac medications before your next appointment, please call your pharmacy*  Lab Work: Today we are going to draw a Bmet In 2-3 weeks we will need to redraw the Bmet If you have labs (blood work) drawn today and your tests are completely normal, you will receive your results only by: MyChart Message (if you have MyChart) OR A paper copy in the mail If you have any lab test that is abnormal or we need to change your treatment, we will call you to review the results.  Testing/Procedures:   Your cardiac CT will be scheduled at one of the below locations:   [facility] [address] [place], [place] [address] [phone]  If scheduled at [facility], please arrive at the [facility] Entrance (Entrance C2) of [facility] 30 minutes prior to test start time. You can use the FREE valet parking offered at entrance C (encouraged to control the heart rate for the test)  Proceed to the [facility] Radiology Department (first floor) to check-in and test prep.  All radiology patients and guests should use entrance C2 at [facility], accessed from [facility], even though the hospital's physical address listed is [address]     Please follow these instructions carefully (unless otherwise directed):  An IV will be required for this test and Nitroglycerin will be given.  Hold all erectile dysfunction medications at least 3 days (72 hrs) prior to test. (Ie viagra, cialis, sildenafil, tadalafil, etc)   On the Night Before the Test: Be sure to Drink plenty of water. Do not consume any caffeinated/decaffeinated beverages or chocolate 12 hours prior to your test. Do not take any antihistamines 12 hours prior to your test.  On the Day of the Test: Drink plenty of water until  1 hour prior to the test. Do not eat any food 1 hour prior to test. You may take your regular medications prior to the test.  Take metoprolol (Lopressor) two hours prior to test. If you take Furosemide/Hydrochlorothiazide/Spironolactone/Chlorthalidone, please HOLD on the morning of the test. Patients who wear a continuous glucose monitor MUST remove the device prior to scanning.      After the Test: Drink plenty of water. After receiving IV contrast, you may experience a mild flushed feeling. This is normal. On occasion, you may experience a mild rash up to 24 hours after the test. This is not dangerous. If this occurs, you can take Benadryl 25 mg, Zyrtec, Claritin, or Allegra and increase your fluid intake. (Patients taking Tikosyn should avoid Benadryl, and may take Zyrtec, Claritin, or Allegra) If you experience trouble breathing, this can be serious. If it is severe call [phone] IMMEDIATELY. If it is mild, please call our office.  We will call to schedule your test 2-4 weeks out understanding that some insurance companies will need an authorization prior to the service being performed.   For more information and frequently asked questions, please visit our website : [email or web address]  For non-scheduling related questions, please contact the cardiac imaging nurse navigator should you have any questions/concerns: Cardiac Imaging Nurse Navigators Direct Office Dial: [phone]   For scheduling needs, including cancellations and rescheduling, please call [place], [phone].   Follow-Up: At [facility], you and your health needs are our priority.  As part of  our continuing mission to provide you with exceptional heart care, we have created designated Provider Care Teams.  These Care Teams include your primary Cardiologist (physician) and Advanced Practice Providers (APPs -  Physician Assistants and Nurse Practitioners) who all work together to provide you with the  care you need, when you need it.  We recommend signing up for the patient portal called "MyChart".  Sign up information is provided on this After Visit Summary.  MyChart is used to connect with patients for Virtual Visits (Telemedicine).  Patients are able to view lab/test results, encounter notes, upcoming appointments, etc.  Non-urgent messages can be sent to your provider as well.   To learn more about what you can do with MyChart, go to [email or web address].    Your next appointment:   3-4 month(s)  Provider:   Rollene Rotunda, MD

## 2023-11-28 ENCOUNTER — Telehealth: Payer: Self-pay

## 2023-11-28 NOTE — Telephone Encounter (Signed)
-----   Message from Jonita Albee sent at 11/26/2023  3:35 PM EST ----- Please tell patient that his lab work showed normal kidney function, normal electrolytes. OK to continue current medications and undergo coronary CTA. Reminder that he needs BMP in 2 weeks after starting losartan (already ordered)

## 2023-11-28 NOTE — Telephone Encounter (Signed)
 Left message to call back

## 2023-11-29 ENCOUNTER — Ambulatory Visit: Payer: Medicaid Other | Admitting: Internal Medicine

## 2023-11-29 NOTE — Telephone Encounter (Signed)
 Patient identification verified by 2 forms. Marilynn Rail, RN    Called and spoke to patient  Relayed result message below  Patient verbalized understanding, no questions at this time

## 2023-11-29 NOTE — Telephone Encounter (Signed)
 Pt returning call/requesting c/b

## 2023-12-06 ENCOUNTER — Ambulatory Visit: Payer: Medicare Other | Admitting: Physician Assistant

## 2023-12-09 ENCOUNTER — Encounter: Payer: Self-pay | Admitting: Cardiology

## 2023-12-09 NOTE — Progress Notes (Deleted)
 Robert Yates, male    DOB: 26-Apr-1961,    MRN: [id number]   Brief patient profile:  63 yo wm   active smoker ran [employer] HS  referred to pulmonary clinic in [place]  06/10/2021 for copd eval dx in [place] "Stage 2"     History of Present Illness  06/10/2021  Pulmonary/ 1st office eval/ Arla Boutwell / [place] Office  Chief Complaint  Patient presents with   Follow-up    Some SOB when walking with exertion but not when sitting still normally. Coughing a lot while laying down, sometimes coughs up mucus with yellow and clear. Switched to Dr. Sherene Sires for location purposes and medication. No oxygen or CPAP at home     Dyspnea:  MMRC1 = can walk nl pace, flat grade, can't hurry or go uphills or steps s sob  on [address]  Cough: minimal at hs most noct  Sleep: bed is flat, one pillow does better when it's cool SABA use: none  Lung cancer screening :  referred for early decision making Rec We need your last set of lung functions from Dr Katrinka Blazing at [facility] A = Automatic = Always=    Breo 100 one each  Stay as physically active as you can ideally about 30 minutes of exercise a day     12/11/2023  f/u ov/[place] office/Deriona Altemose re: *** maint on ***  No chief complaint on file.   Dyspnea:  *** Cough: *** Sleeping: ***   resp cc  SABA use: *** 02: ***  Lung cancer screening: 03/17/23  RADS 2/ mild emphysema    No obvious day to day or daytime variability or assoc excess/ purulent sputum or mucus plugs or hemoptysis or cp or chest tightness, subjective wheeze or overt sinus or hb symptoms.    Also denies any obvious fluctuation of symptoms with weather or environmental changes or other aggravating or alleviating factors except as outlined above   No unusual exposure hx or h/o childhood pna/ asthma or knowledge of premature birth.  Current Allergies, Complete Past Medical History, Past Surgical History, Family History, and Social History were reviewed in [employer]  record.  ROS  The following are not active complaints unless bolded Hoarseness, sore throat, dysphagia, dental problems, itching, sneezing,  nasal congestion or discharge of excess mucus or purulent secretions, ear ache,   fever, chills, sweats, unintended wt loss or wt gain, classically pleuritic or exertional cp,  orthopnea pnd or arm/hand swelling  or leg swelling, presyncope, palpitations, abdominal pain, anorexia, nausea, vomiting, diarrhea  or change in bowel habits or change in bladder habits, change in stools or change in urine, dysuria, hematuria,  rash, arthralgias, visual complaints, headache, numbness, weakness or ataxia or problems with walking or coordination,  change in mood or  memory.        No outpatient medications have been marked as taking for the 12/11/23 encounter (Appointment) with Nyoka Cowden, MD.                   Past Medical History:  Diagnosis Date   COPD (chronic obstructive pulmonary disease) (HCC)    Palpitations        Objective:    Wts  12/11/2023          ***   11/24/23 165 lb 12.8 oz (75.2 kg)  10/26/23 161 lb 6.4 oz (73.2 kg)  10/19/23 163 lb 4.8 oz (74.1 kg)      Vital signs reviewed  12/11/2023  - Note at rest 02 sats  ***% on ***   General appearance:    ***     Min barr           Assessment

## 2023-12-11 ENCOUNTER — Ambulatory Visit: Payer: HMO | Admitting: Internal Medicine

## 2023-12-11 ENCOUNTER — Telehealth: Payer: Self-pay | Admitting: Internal Medicine

## 2023-12-11 NOTE — Telephone Encounter (Signed)
 Spoke with patient regarding new appointment date and time for Dr. Sherene Sires   Monday 01/08/24 at 9:15 am---patient voiced his understanding

## 2024-01-05 ENCOUNTER — Encounter (HOSPITAL_COMMUNITY): Payer: Self-pay

## 2024-01-05 NOTE — Progress Notes (Unsigned)
 Robert Yates, male    DOB: August 10, 1961,    MRN: [id number]   Brief patient profile:  63 yo wm   active smoker ran [employer] HS  referred to pulmonary clinic in [place]  06/10/2021 for copd eval dx in [place] "Stage 2"    History of Present Illness  06/10/2021  Pulmonary/ 1st office eval/ Juliano Mceachin / [place] [facility]  Chief Complaint  Patient presents with   Follow-up    Some SOB when walking with exertion but not when sitting still normally. Coughing a lot while laying down, sometimes coughs up mucus with yellow and clear. Switched to Dr. Sherene Sires for location purposes and medication. No oxygen or CPAP at home     Dyspnea:  MMRC1 = can walk nl pace, flat grade, can't hurry or go uphills or steps s sob  on Breo 200  Cough: minimal at hs most noct  Sleep: bed is flat, one pillow does better when it's cool SABA use: none  Lung cancer screening :  referred for early decision making Rec We need your last set of lung functions from Dr Katrinka Blazing at [facility] A = Automatic = Always=    Breo 100 one each  Stay as physically active as you can ideally about 30 minutes of exercise a day     01/08/2024  f/u ov/[place] office/[facility] re: COPD GOLD 2/ AB  maint on advair 250   Chief Complaint  Patient presents with   Shortness of Breath  Dyspnea:  Not limited by breathing from desired activities   Cough: none  Sleeping: flat bed/ one pillow on side s  resp cc  SABA use: none  02: none   Lung cancer screening: 03/17/23  RADS 2/ mild emphysema    No obvious day to day or daytime variability or assoc excess/ purulent sputum or mucus plugs or hemoptysis or cp or chest tightness, subjective wheeze or overt sinus or hb symptoms.    Also denies any obvious fluctuation of symptoms with weather or environmental changes or other aggravating or alleviating factors except as outlined above   No unusual exposure hx or h/o childhood pna/ asthma or knowledge of premature birth.  Current Allergies, Complete Past  Medical History, Past Surgical History, Family History, and Social History were reviewed in [employer] record.  ROS  The following are not active complaints unless bolded Hoarseness, sore throat, dysphagia, dental problems, itching, sneezing,  nasal congestion or discharge of excess mucus or purulent secretions, ear ache,   fever, chills, sweats, unintended wt loss or wt gain, classically pleuritic or exertional cp,  orthopnea pnd or arm/hand swelling  or leg swelling, presyncope, palpitations, abdominal pain, anorexia, nausea, vomiting, diarrhea  or change in bowel habits or change in bladder habits, change in stools or change in urine, dysuria, hematuria,  rash, arthralgias, visual complaints, headache, numbness, weakness or ataxia or problems with walking or coordination,  change in mood or  memory.        Current Meds  Medication Sig   aspirin EC 81 MG tablet Take 1 tablet (81 mg total) by mouth daily. Swallow whole.   fluticasone-salmeterol (ADVAIR) 250-50 MCG/ACT AEPB Inhale 1 puff into the lungs in the morning and at bedtime.   losartan (COZAAR) 25 MG tablet Take 0.5 tablets (12.5 mg total) by mouth daily.   metoprolol tartrate (LOPRESSOR) 25 MG tablet Take 0.5 tablets (12.5 mg total) by mouth 2 (two) times daily.   rosuvastatin (CRESTOR) 40 MG tablet  Take 1 tablet (40 mg total) by mouth at bedtime.                   Past Medical History:  Diagnosis Date   COPD (chronic obstructive pulmonary disease) (HCC)    Palpitations        Objective:    Wts  01/08/2024       166   11/24/23 165 lb 12.8 oz (75.2 kg)  10/26/23 161 lb 6.4 oz (73.2 kg)  10/19/23 163 lb 4.8 oz (74.1 kg)      Vital signs reviewed  01/08/2024  - Note at rest 02 sats  94% on RA   General appearance:   Somber stoic amb wm nad      HEENT : Oropharynx  clear/full dentures    Nasal turbinates nl    NECK :  without  apparent JVD/ palpable Nodes/TM    LUNGS: no acc muscle use,  Min  barrel  contour chest wall with bilateral  slightly decreased bs s audible wheeze and  without cough on insp or exp maneuvers and min  Hyperresonant  to  percussion bilaterally    CV:  RRR  no s3 or murmur or increase in P2, and no edema   ABD:  soft and nontender with pos end  insp Hoover's  in the supine position.  No bruits or organomegaly appreciated   MS:  Nl gait/ ext warm without deformities Or obvious joint restrictions  calf tenderness, cyanosis or clubbing     SKIN: warm and dry without lesions    NEURO:  alert, approp, nl sensorium with  no motor or cerebellar deficits apparent.              Assessment

## 2024-01-08 ENCOUNTER — Ambulatory Visit (INDEPENDENT_AMBULATORY_CARE_PROVIDER_SITE_OTHER): Admitting: Internal Medicine

## 2024-01-08 ENCOUNTER — Encounter: Payer: Self-pay | Admitting: Internal Medicine

## 2024-01-08 ENCOUNTER — Telehealth: Payer: Self-pay | Admitting: Cardiology

## 2024-01-08 VITALS — BP 104/68 | HR 80 | Ht 74.0 in | Wt 166.0 lb

## 2024-01-08 DIAGNOSIS — J449 Chronic obstructive pulmonary disease, unspecified: Secondary | ICD-10-CM

## 2024-01-08 DIAGNOSIS — F17210 Nicotine dependence, cigarettes, uncomplicated: Secondary | ICD-10-CM

## 2024-01-08 MED ORDER — FLUTICASONE-SALMETEROL 250-50 MCG/ACT IN AEPB: 1.0000 | INHALATION_SPRAY | Freq: Two times a day (BID) | RESPIRATORY_TRACT | 11 refills | Status: AC

## 2024-01-08 NOTE — Telephone Encounter (Signed)
 Spoke to patient he stated he wanted to make sure ok to take Metoprolol tartrate 100 mg 2 hours before Coronary CT 4/1.Stated he saw Pulmonary Dr.this morning Pulse 80 B/P 104/86.Pulmonary Dr.advised he should have someone drive him.Stated he cannot get someone this late of notice.Advised ok to drive his self.Advised if he feels too weak or dizzy he should not drive.I will make Dr.Hochrein aware.

## 2024-01-08 NOTE — Assessment & Plan Note (Signed)
 Active smoker    Onset around 2019  - Alpha one AT levels   172  11/08/23  - Walking study 11/14/2018 no desats RA  X 520 ft  -  PFT's 11/14/2018  FEV1 2.33 (64 % ) ratio 0.52  p 22 % improvement from saba p? prior to study with DLCO 18.5 (79%) corrects to 2.88 (77%)  for alv volume and FV curve classic concavity   -  Breo 100 one each am  06/10/2021   Ideally should be on triple rx but denies limiting doe or trend to aecopd and insurance will only cover advair which has worked in past to so refilled and f/u q 12 m  Each maintenance medication was reviewed in detail including emphasizing most importantly the difference between maintenance and prns and under what circumstances the prns are to be triggered using an action plan format where appropriate.  Total time for H and P, chart review, counseling, reviewing dpi device(s) and generating customized AVS unique to this office visit / same day charting = 20 min

## 2024-01-08 NOTE — Telephone Encounter (Signed)
 Pt c/o medication issue:  1. Name of Medication: metoprolol tartrate (LOPRESSOR) 25 MG tablet   2. How are you currently taking this medication (dosage and times per day)? As written  3. Are you having a reaction (difficulty breathing--STAT)? No  4. What is your medication issue? Pt would like a c/b in regards to this this medication pt was told to take 100 MG tablet for testing tomorrow and pt would like to know if he'll be okay to drive to and from testing. Please advise

## 2024-01-08 NOTE — Patient Instructions (Addendum)
 Keep up with your appts for Lung  scanning every June   The key is to stop smoking completely before smoking completely stops you!    Please schedule a follow up visit in 12 months but call sooner if needed

## 2024-01-08 NOTE — Telephone Encounter (Signed)
Called patient left Dr.Hochrein's advice on personal voice mail.

## 2024-01-09 ENCOUNTER — Encounter (HOSPITAL_COMMUNITY): Payer: Self-pay

## 2024-01-09 ENCOUNTER — Ambulatory Visit (HOSPITAL_COMMUNITY)
Admission: RE | Admit: 2024-01-09 | Discharge: 2024-01-09 | Disposition: A | Discharge status: Home / Self Care | Source: Ambulatory Visit | Attending: Cardiology | Admitting: Cardiology

## 2024-01-09 DIAGNOSIS — I5022 Chronic systolic (congestive) heart failure: Secondary | ICD-10-CM | POA: Insufficient documentation

## 2024-01-09 DIAGNOSIS — I2510 Atherosclerotic heart disease of native coronary artery without angina pectoris: Secondary | ICD-10-CM | POA: Insufficient documentation

## 2024-01-09 MED ORDER — NITROGLYCERIN 0.4 MG SL SUBL
SUBLINGUAL_TABLET | SUBLINGUAL | Status: AC
  Filled 2024-01-09: qty 2

## 2024-01-09 MED ORDER — DILTIAZEM HCL 25 MG/5ML IV SOLN: 10.0000 mg | INTRAVENOUS | Status: DC | PRN

## 2024-01-09 MED ORDER — NITROGLYCERIN 0.4 MG SL SUBL
0.8000 mg | SUBLINGUAL_TABLET | Freq: Once | SUBLINGUAL | Status: AC
  Administered 2024-01-09: 0.8 mg via SUBLINGUAL

## 2024-01-09 NOTE — Assessment & Plan Note (Addendum)
 4-5 min discussion re active cigarette smoking in addition to office E&M  Ask about tobacco use: ongoing Advise quitting   I took an extended  opportunity with this patient to outline the consequences of continued cigarette use  in airway disorders based on all the data we have from the multiple national lung health studies (perfomed over decades at millions of dollars in cost)  indicating that smoking cessation, not choice of inhalers or pulmonary physicians, is the most important aspect of his care.   Assess willingness:  Not committed at this point Assist in quit attempt:  Per PCP when ready Arrange follow up:   Follow up per Primary Care planned   Low-dose CT lung cancer screening is recommended for patients who are 59-26 years of age with a 20+ pack-year history of smoking and who are currently smoking or quit <=15 years ago. No coughing up blood  No unintentional weight loss of > 15 pounds in the last 6 months - pt is eligible for scanning yearly until age 55 as actively smoking at age 63 Robert Yates

## 2024-01-09 NOTE — Progress Notes (Signed)
 Pt. To IR for coronary CT as scheduled. Pt. On tele monitor reading bigeminy. Charge RN notified and advised to alert CT of rhythm. CT tech x2 Morrie Sheldon and Dahlia Client) assessed tele rhythm in IR [place] [facility] to determine if it was able to be scanned. Per CT tech x2, okay for this RN to proceed with care to attempt CT scan. Pt. Educated, PIV x1 placed. Pt. To CT to attempt scan with Dahlia Client in CT giving permission for nitroglycerin administration - see MAR. Per CT tech x2 Casimiro Needle and [address]) post nitroglycerin administration, unable to scan at this time even with pre-approved rhythm. Per Casimiro Needle in CT, cancel scan and reschedule. CT tech educated pt on cancellation and notified cardiac navigator by phone. BP 105/64 post nitroglycerin. PIV x1 removed and pt. Discharged to Entrance C on [facility].

## 2024-02-29 ENCOUNTER — Ambulatory Visit: Payer: HMO | Admitting: Cardiology

## 2024-03-27 ENCOUNTER — Other Ambulatory Visit: Payer: Self-pay | Admitting: Internal Medicine

## 2024-03-27 DIAGNOSIS — R002 Palpitations: Secondary | ICD-10-CM

## 2024-04-09 ENCOUNTER — Ambulatory Visit
Admission: RE | Admit: 2024-04-09 | Discharge: 2024-04-09 | Disposition: A | Discharge status: Home / Self Care | Source: Ambulatory Visit | Attending: Internal Medicine | Admitting: Internal Medicine

## 2024-04-09 DIAGNOSIS — F17210 Nicotine dependence, cigarettes, uncomplicated: Secondary | ICD-10-CM

## 2024-04-09 DIAGNOSIS — Z122 Encounter for screening for malignant neoplasm of respiratory organs: Secondary | ICD-10-CM

## 2024-04-09 DIAGNOSIS — Z87891 Personal history of nicotine dependence: Secondary | ICD-10-CM

## 2024-04-22 ENCOUNTER — Telehealth: Payer: Self-pay | Admitting: Acute Care

## 2024-04-22 NOTE — Telephone Encounter (Signed)
 Copied from CRM [id number]. Topic: Clinical - Lab/Test Results >> Apr 22, 2024  1:00 PM Chantha C wrote: Reason for CRM: Patient [phone] is returning the office on test results, please call back.  Patient calling back for results.

## 2024-04-22 NOTE — Telephone Encounter (Signed)
 Called and left VM for pt. Patient's PCP ordered a an abd MRI in 10/2023 but this was never done.    IMPRESSION: 1. Lung-RADS 2, benign appearance or behavior. Continue annual screening with low-dose chest CT without contrast in 12 months. 2. Pulmonary artery enlargement suggests pulmonary arterial hypertension. 3. Indeterminate lower pole right renal 9 mm lesion. As on prior abdominal CT of 10/10/2023, this should be considered for dedicated pre and post contrast abdominal MRI follow-up. 4. Aortic Atherosclerosis (ICD10-I70.0) and Emphysema (ICD10-J43.9). Coronary artery atherosclerosis.     Electronically Signed   By: Rockey Kilts M.D.   On: 04/21/2024 12:43

## 2024-04-24 ENCOUNTER — Encounter: Payer: Self-pay | Admitting: Internal Medicine

## 2024-04-24 NOTE — Telephone Encounter (Signed)
 Returned call. LVM to call office and review results.

## 2024-04-25 ENCOUNTER — Other Ambulatory Visit: Payer: Self-pay

## 2024-04-25 DIAGNOSIS — Z122 Encounter for screening for malignant neoplasm of respiratory organs: Secondary | ICD-10-CM

## 2024-04-25 DIAGNOSIS — Z87891 Personal history of nicotine dependence: Secondary | ICD-10-CM

## 2024-04-25 DIAGNOSIS — F17210 Nicotine dependence, cigarettes, uncomplicated: Secondary | ICD-10-CM

## 2024-04-25 NOTE — Telephone Encounter (Signed)
 Spoke with patient and reviewed recent Lung CT results. He will complete his annual Lung CT again next year. He is aware of the 9 mm kidney lesion and aware that he has not completed his MRI with his PCP. Advised patient that I would forward results to Dr. Jesus and they could discuss completing planning imaging if warranted. No additional needs. Annual CT order placed.   Dr. Jesus, I have forward results to your office. Please let us  know if we can be of assistance with this patient. Have a good day!  Isaiah, RN

## 2024-04-25 NOTE — Telephone Encounter (Signed)
 Results reviewed with the patient. See phone notes.

## 2024-06-20 NOTE — Progress Notes (Unsigned)
   [name], PhD, LAT, ATC acting as a scribe for Artist Lloyd, MD.  Robert Yates is a 63 y.o. male who presents to [facility] today for f/u R shoulder and and low back pain and rx refill. Pt was last seen by Dr. Lloyd on 12/28/22.  Today, pt reports ***  Pertinent review of systems: ***  Relevant historical information: ***   Exam:  There were no vitals taken for this visit. General: Well Developed, well nourished, and in no acute distress.   MSK: ***    Lab and Radiology Results No results found for this or any previous visit (from the past 72 hours). No results found.     Assessment and Plan: 63 y.o. male with ***   PDMP not reviewed this encounter. No orders of the defined types were placed in this encounter.  No orders of the defined types were placed in this encounter.    Discussed warning signs or symptoms. Please see discharge instructions. Patient expresses understanding.   ***

## 2024-06-21 ENCOUNTER — Ambulatory Visit (INDEPENDENT_AMBULATORY_CARE_PROVIDER_SITE_OTHER): Admitting: Family Medicine

## 2024-06-21 ENCOUNTER — Encounter: Payer: Self-pay | Admitting: Family Medicine

## 2024-06-21 VITALS — BP 98/64 | HR 83 | Ht 74.0 in

## 2024-06-21 DIAGNOSIS — G8929 Other chronic pain: Secondary | ICD-10-CM

## 2024-06-21 DIAGNOSIS — M25561 Pain in right knee: Secondary | ICD-10-CM

## 2024-06-21 DIAGNOSIS — M545 Low back pain, unspecified: Secondary | ICD-10-CM

## 2024-06-21 MED ORDER — TIZANIDINE HCL 2 MG PO TABS: 2.0000 mg | ORAL_TABLET | Freq: Three times a day (TID) | ORAL | 3 refills | Status: DC | PRN

## 2024-06-21 NOTE — Patient Instructions (Addendum)
 Thank you for coming in today.   Use the compression sleeve  Limit use of Ibuprofen  Please use Voltaren gel (Generic Diclofenac Gel) up to 4x daily for pain as needed.  This is available over-the-counter as both the name brand Voltaren gel and the generic diclofenac gel.   I've sent a prescription for Tizanidine  to your pharmacy.   Check back as needed

## 2024-06-24 ENCOUNTER — Ambulatory Visit: Admitting: Family Medicine

## 2024-06-25 DIAGNOSIS — C44619 Basal cell carcinoma of skin of left upper limb, including shoulder: Secondary | ICD-10-CM | POA: Diagnosis not present

## 2024-06-25 DIAGNOSIS — Z1283 Encounter for screening for malignant neoplasm of skin: Secondary | ICD-10-CM | POA: Diagnosis not present

## 2024-06-25 DIAGNOSIS — D225 Melanocytic nevi of trunk: Secondary | ICD-10-CM | POA: Diagnosis not present

## 2024-06-25 DIAGNOSIS — C44519 Basal cell carcinoma of skin of other part of trunk: Secondary | ICD-10-CM | POA: Diagnosis not present

## 2024-06-25 DIAGNOSIS — L820 Inflamed seborrheic keratosis: Secondary | ICD-10-CM | POA: Diagnosis not present

## 2024-06-25 DIAGNOSIS — L821 Other seborrheic keratosis: Secondary | ICD-10-CM | POA: Diagnosis not present

## 2024-07-07 ENCOUNTER — Encounter: Payer: Self-pay | Admitting: Internal Medicine

## 2024-07-11 ENCOUNTER — Other Ambulatory Visit: Payer: Self-pay

## 2024-07-11 DIAGNOSIS — Z0100 Encounter for examination of eyes and vision without abnormal findings: Secondary | ICD-10-CM

## 2024-07-11 NOTE — Telephone Encounter (Signed)
 Placed referral

## 2024-07-11 NOTE — Telephone Encounter (Unsigned)
 Copied from CRM [id number]. Topic: Referral - Request for Referral >> Jul 10, 2024  9:04 AM Avram MATSU wrote: Did the patient discuss referral with their provider in the last year? No (If No - schedule appointment) (If Yes - send message)  Appointment offered? Yes  Type of order/referral and detailed reason for visit:  optometry  Preference of office, provider, location: [place]  If referral order, have you been seen by this specialty before? No (If Yes, this issue or another issue? When? Where?  Can we respond through MyChart? Yes

## 2024-08-26 ENCOUNTER — Other Ambulatory Visit: Payer: Self-pay | Admitting: Internal Medicine

## 2024-08-26 DIAGNOSIS — I2510 Atherosclerotic heart disease of native coronary artery without angina pectoris: Secondary | ICD-10-CM

## 2024-09-04 ENCOUNTER — Ambulatory Visit: Admitting: Internal Medicine

## 2024-09-20 ENCOUNTER — Other Ambulatory Visit: Payer: Self-pay | Admitting: Family Medicine

## 2024-09-20 NOTE — Telephone Encounter (Signed)
 Last OV 06/21/24 Next OV not scheduled  Last refill 06/21/24 Qty #180/3

## 2024-10-24 ENCOUNTER — Encounter: Payer: Self-pay | Admitting: Internal Medicine

## 2024-11-01 ENCOUNTER — Other Ambulatory Visit: Payer: Self-pay | Admitting: Cardiology

## 2024-11-04 ENCOUNTER — Ambulatory Visit: Admitting: Internal Medicine

## 2024-11-21 ENCOUNTER — Ambulatory Visit: Admitting: Internal Medicine

## 2024-11-28 ENCOUNTER — Ambulatory Visit: Admitting: Internal Medicine
